# Patient Record
Sex: Male | Born: 1945 | Race: White | Hispanic: No | Smoking: Current every day smoker
Health system: Southern US, Community
[De-identification: ages and names within clinical notes are randomized; demographics above are authoritative.]

## PROBLEM LIST (undated history)

## (undated) DIAGNOSIS — E669 Obesity, unspecified: Secondary | ICD-10-CM

## (undated) DIAGNOSIS — N400 Enlarged prostate without lower urinary tract symptoms: Secondary | ICD-10-CM

## (undated) DIAGNOSIS — I4891 Unspecified atrial fibrillation: Secondary | ICD-10-CM

## (undated) DIAGNOSIS — G4730 Sleep apnea, unspecified: Secondary | ICD-10-CM

## (undated) DIAGNOSIS — I10 Essential (primary) hypertension: Secondary | ICD-10-CM

## (undated) DIAGNOSIS — E785 Hyperlipidemia, unspecified: Secondary | ICD-10-CM

## (undated) DIAGNOSIS — M109 Gout, unspecified: Secondary | ICD-10-CM

## (undated) DIAGNOSIS — I429 Cardiomyopathy, unspecified: Secondary | ICD-10-CM

## (undated) DIAGNOSIS — K219 Gastro-esophageal reflux disease without esophagitis: Secondary | ICD-10-CM

## (undated) HISTORY — PX: APPENDECTOMY: SHX54

## (undated) HISTORY — PX: ESOPHAGOGASTRODUODENOSCOPY: SHX1529

## (undated) HISTORY — PX: JOINT REPLACEMENT: SHX530

## (undated) HISTORY — PX: COLONOSCOPY: SHX174

---

## 2010-07-02 ENCOUNTER — Ambulatory Visit: Payer: Self-pay | Admitting: Internal Medicine

## 2010-08-26 ENCOUNTER — Ambulatory Visit: Payer: Self-pay | Admitting: Cardiology

## 2010-10-22 ENCOUNTER — Ambulatory Visit: Payer: Self-pay | Admitting: Cardiology

## 2012-02-09 ENCOUNTER — Emergency Department: Payer: Self-pay | Admitting: Emergency Medicine

## 2012-02-09 LAB — TROPONIN I: Troponin-I: <0.02 ng/mL

## 2012-02-09 LAB — BASIC METABOLIC PANEL
Anion Gap: 10 (ref 7–16)
Calcium, Total: 8.8 mg/dL (ref 8.5–10.1)
Chloride: 103 mmol/L (ref 98–107)
EGFR (African American): >60
EGFR (Non-African Amer.): >60
Glucose: 144 mg/dL — ABNORMAL HIGH (ref 65–99)
Osmolality: 282 (ref 275–301)
Potassium: 3.4 mmol/L — ABNORMAL LOW (ref 3.5–5.1)
Sodium: 141 mmol/L (ref 136–145)

## 2012-02-09 LAB — CBC
HCT: 49.4 % (ref 40.0–52.0)
MCHC: 34 g/dL (ref 32.0–36.0)
MCV: 101 fL — ABNORMAL HIGH (ref 80–100)
Platelet: 169 10*3/uL (ref 150–440)
RBC: 4.88 10*6/uL (ref 4.40–5.90)
RDW: 14.1 % (ref 11.5–14.5)
WBC: 9 10*3/uL (ref 3.8–10.6)

## 2012-02-09 LAB — CK TOTAL AND CKMB (NOT AT ARMC)
CK, Total: 56 U/L (ref 35–232)
CK-MB: 0.8 ng/mL (ref 0.5–3.6)

## 2013-01-22 ENCOUNTER — Emergency Department: Payer: Self-pay | Admitting: Emergency Medicine

## 2014-11-28 ENCOUNTER — Encounter: Payer: Self-pay | Admitting: *Deleted

## 2014-11-29 ENCOUNTER — Ambulatory Visit: Payer: PPO | Admitting: Anesthesiology

## 2014-11-29 ENCOUNTER — Encounter: Payer: Self-pay | Admitting: *Deleted

## 2014-11-29 ENCOUNTER — Ambulatory Visit
Admission: RE | Admit: 2014-11-29 | Discharge: 2014-11-29 | Disposition: A | Discharge status: Home Health | Payer: PPO | Source: Ambulatory Visit | Attending: Gastroenterology | Admitting: Gastroenterology

## 2014-11-29 ENCOUNTER — Encounter
Admission: RE | Disposition: A | Discharge status: Home Health | Payer: Self-pay | Source: Ambulatory Visit | Attending: Gastroenterology

## 2014-11-29 DIAGNOSIS — Z7901 Long term (current) use of anticoagulants: Secondary | ICD-10-CM | POA: Insufficient documentation

## 2014-11-29 DIAGNOSIS — K219 Gastro-esophageal reflux disease without esophagitis: Secondary | ICD-10-CM | POA: Diagnosis not present

## 2014-11-29 DIAGNOSIS — I10 Essential (primary) hypertension: Secondary | ICD-10-CM | POA: Insufficient documentation

## 2014-11-29 DIAGNOSIS — Z1211 Encounter for screening for malignant neoplasm of colon: Secondary | ICD-10-CM | POA: Insufficient documentation

## 2014-11-29 DIAGNOSIS — D122 Benign neoplasm of ascending colon: Secondary | ICD-10-CM | POA: Diagnosis not present

## 2014-11-29 DIAGNOSIS — Z86010 Personal history of colonic polyps: Secondary | ICD-10-CM | POA: Insufficient documentation

## 2014-11-29 DIAGNOSIS — K5730 Diverticulosis of large intestine without perforation or abscess without bleeding: Secondary | ICD-10-CM | POA: Insufficient documentation

## 2014-11-29 DIAGNOSIS — E669 Obesity, unspecified: Secondary | ICD-10-CM | POA: Insufficient documentation

## 2014-11-29 DIAGNOSIS — F17210 Nicotine dependence, cigarettes, uncomplicated: Secondary | ICD-10-CM | POA: Diagnosis not present

## 2014-11-29 DIAGNOSIS — G4730 Sleep apnea, unspecified: Secondary | ICD-10-CM | POA: Diagnosis not present

## 2014-11-29 DIAGNOSIS — I4891 Unspecified atrial fibrillation: Secondary | ICD-10-CM | POA: Insufficient documentation

## 2014-11-29 DIAGNOSIS — Z79899 Other long term (current) drug therapy: Secondary | ICD-10-CM | POA: Diagnosis not present

## 2014-11-29 HISTORY — DX: Hyperlipidemia, unspecified: E78.5

## 2014-11-29 HISTORY — DX: Sleep apnea, unspecified: G47.30

## 2014-11-29 HISTORY — DX: Gout, unspecified: M10.9

## 2014-11-29 HISTORY — DX: Cardiomyopathy, unspecified: I42.9

## 2014-11-29 HISTORY — DX: Benign prostatic hyperplasia without lower urinary tract symptoms: N40.0

## 2014-11-29 HISTORY — DX: Obesity, unspecified: E66.9

## 2014-11-29 HISTORY — DX: Gastro-esophageal reflux disease without esophagitis: K21.9

## 2014-11-29 HISTORY — DX: Unspecified atrial fibrillation: I48.91

## 2014-11-29 HISTORY — DX: Essential (primary) hypertension: I10

## 2014-11-29 HISTORY — PX: COLONOSCOPY WITH PROPOFOL: SHX5780

## 2014-11-29 SURGERY — COLONOSCOPY WITH PROPOFOL
Anesthesia: General

## 2014-11-29 MED ORDER — PROPOFOL INFUSION 10 MG/ML OPTIME
INTRAVENOUS | Status: DC | PRN
  Administered 2014-11-29: 120 ug/kg/min via INTRAVENOUS

## 2014-11-29 MED ORDER — FENTANYL CITRATE (PF) 100 MCG/2ML IJ SOLN
INTRAMUSCULAR | Status: DC | PRN
  Administered 2014-11-29: 50 ug via INTRAVENOUS

## 2014-11-29 MED ORDER — MIDAZOLAM HCL 2 MG/2ML IJ SOLN
INTRAMUSCULAR | Status: DC | PRN
  Administered 2014-11-29: 1 mg via INTRAVENOUS

## 2014-11-29 MED ORDER — SODIUM CHLORIDE 0.9 % IV SOLN
INTRAVENOUS | Status: DC
  Administered 2014-11-29: 09:00:00 via INTRAVENOUS

## 2014-11-29 NOTE — Anesthesia Preprocedure Evaluation (Signed)
Anesthesia Evaluation  Patient identified by MRN, date of birth, ID band Patient awake    Reviewed: Allergy & Precautions, H&P , NPO status , Patient's Chart, lab work & pertinent test results, reviewed documented beta blocker date and time   History of Anesthesia Complications Negative for: history of anesthetic complications  Airway Mallampati: III  TM Distance: >3 FB Neck ROM: full    Dental  (+) Caps, Missing, Poor Dentition   Pulmonary neg shortness of breath, sleep apnea and Continuous Positive Airway Pressure Ventilation , neg COPDneg recent URI, Current Smoker,  breath sounds clear to auscultation  Pulmonary exam normal       Cardiovascular Exercise Tolerance: Good hypertension, On Medications and On Home Beta Blockers - angina- CAD, - Past MI, - Cardiac Stents and - CABG Normal cardiovascular exam+ dysrhythmias Atrial Fibrillation Rhythm:regular Rate:Normal  Cardiomyopathy   Neuro/Psych negative neurological ROS  negative psych ROS   GI/Hepatic Neg liver ROS, GERD-  ,  Endo/Other  negative endocrine ROS  Renal/GU negative Renal ROS  negative genitourinary   Musculoskeletal   Abdominal   Peds  Hematology negative hematology ROS (+)   Anesthesia Other Findings Past Medical History:   Cardiomyopathy, secondary                                    A-fib                                                        Hypertension                                                 Sleep apnea                                                  Obesity                                                      Benign prostatic hyperplasia                                 GERD (gastroesophageal reflux disease)                       Gout                                                         Hyperlipidemia  Reproductive/Obstetrics negative OB ROS                              Anesthesia Physical Anesthesia Plan  ASA: III  Anesthesia Plan: General   Post-op Pain Management:    Induction:   Airway Management Planned:   Additional Equipment:   Intra-op Plan:   Post-operative Plan:   Informed Consent: I have reviewed the patients History and Physical, chart, labs and discussed the procedure including the risks, benefits and alternatives for the proposed anesthesia with the patient or authorized representative who has indicated his/her understanding and acceptance.   Dental Advisory Given  Plan Discussed with: Anesthesiologist, CRNA and Surgeon  Anesthesia Plan Comments:         Anesthesia Quick Evaluation

## 2014-11-29 NOTE — H&P (Signed)
  Date of Initial H&P: 11/19/2014 History reviewed, patient examined, no change in status, stable for surgery.

## 2014-11-29 NOTE — Transfer of Care (Signed)
Immediate Anesthesia Transfer of Care Note  Patient: Brendan Edwards  Procedure(s) Performed: Procedure(s): COLONOSCOPY WITH PROPOFOL (N/A)  Patient Location: PACU  Anesthesia Type:General  Level of Consciousness: sedated  Airway & Oxygen Therapy: Patient Spontanous Breathing and Patient connected to nasal cannula oxygen  Post-op Assessment: Report given to RN and Post -op Vital signs reviewed and stable  Post vital signs: Reviewed  Last Vitals:  Filed Vitals:   11/29/14 0824  BP: 125/93  Pulse: 88  Temp: 36.1 C  Resp: 18    Complications: No apparent anesthesia complications

## 2014-11-29 NOTE — Op Note (Signed)
[facility] Patient Name: Brendan Edwards Procedure Date: 11/29/2014 9:14 AM MRN: [id number] Account #: [id number] Date of Birth: 20-Jun-1945 Admit Type: Outpatient Age: 69 Room: [facility] ENDO ROOM 4 Gender: Male Note Status: Finalized Procedure:         Colonoscopy Indications:       Personal history of colonic polyps though pt insists that                     polyp was not truly a polyp; last took eliquis 2 days ago                     though he had to be off for minimum 3 days Providers:         Lupita Dawn. Candace Cruise, MD Referring MD:      Tracie Harrier, MD (Referring MD) Medicines:         Monitored Anesthesia Care Complications:     No immediate complications. Procedure:         Pre-Anesthesia Assessment:                    - Prior to the procedure, a History and Physical was                     performed, and patient medications, allergies and                     sensitivities were reviewed. The patient's tolerance of                     previous anesthesia was reviewed.                    - The risks and benefits of the procedure and the sedation                     options and risks were discussed with the patient. All                     questions were answered and informed consent was obtained.                    - After reviewing the risks and benefits, the patient was                     deemed in satisfactory condition to undergo the procedure.                    After obtaining informed consent, the colonoscope was                     passed under direct vision. Throughout the procedure, the                     patient's blood pressure, pulse, and oxygen saturations                     were monitored continuously. The Colonoscope was                     introduced through the anus and advanced to the the cecum,                     identified by appendiceal orifice and ileocecal valve. The  colonoscopy was performed without  difficulty. The patient                     tolerated the procedure well. The quality of the bowel                     preparation was good. Findings:      A single small-mouthed diverticulum was found in the sigmoid colon.      A small polyp was found in the ascending colon. The polyp was sessile.       Not removed due to eliquis use.      The exam was otherwise without abnormality. Impression:        - Diverticulosis in the sigmoid colon.                    - One small polyp in the ascending colon.                    - The examination was otherwise normal.                    - No specimens collected. Recommendation:    - Discharge patient to home.                    - Repeat colonoscopy in 1 year for surveillance.                    - The findings and recommendations were discussed with the                     surgeon.                    - Needs to be off eliquis for min 3 days next time to                     remove polyp. Procedure Code(s): --- Professional ---                    [phone], Colonoscopy, flexible; diagnostic, including                     collection of specimen(s) by brushing or washing, when                     performed (separate procedure) Diagnosis Code(s): --- Professional ---                    D12.2, Benign neoplasm of ascending colon                    Z86.010, Personal history of colonic polyps                    K57.30, Diverticulosis of large intestine without                     perforation or abscess without bleeding CPT copyright 2014 [employer]. All rights reserved. The codes documented in this report are preliminary and upon coder review may  be revised to meet current compliance requirements. Hulen Luster, MD 11/29/2014 9:31:56 AM This report has been signed electronically. Number of Addenda: 0 Note Initiated On: 11/29/2014 9:14 AM Scope Withdrawal Time: 0 hours 6 minutes 2 seconds  Total Procedure Duration: 0 hours 10 minutes  4  seconds       [facility]

## 2014-11-29 NOTE — Anesthesia Procedure Notes (Signed)
Performed by: Vaughan Sine Pre-anesthesia Checklist: Patient identified, Emergency Drugs available, Suction available, Patient being monitored and Timeout performed Patient Re-evaluated:Patient Re-evaluated prior to inductionOxygen Delivery Method: Nasal cannula Intubation Type: IV induction Airway Equipment and Method: Oral airway Placement Confirmation: CO2 detector and positive ETCO2

## 2014-11-30 ENCOUNTER — Encounter: Payer: Self-pay | Admitting: Gastroenterology

## 2014-11-30 NOTE — Anesthesia Postprocedure Evaluation (Signed)
  Anesthesia Post-op Note  Patient: Brendan Edwards  Procedure(s) Performed: Procedure(s): COLONOSCOPY WITH PROPOFOL (N/A)  Anesthesia type:General  Patient location: PACU  Post pain: Pain level controlled  Post assessment: Post-op Vital signs reviewed, Patient's Cardiovascular Status Stable, Respiratory Function Stable, Patent Airway and No signs of Nausea or vomiting  Post vital signs: Reviewed and stable  Last Vitals:  Filed Vitals:   11/29/14 1010  BP: 152/98  Pulse: 122  Temp:   Resp: 22    Level of consciousness: awake, alert  and patient cooperative  Complications: No apparent anesthesia complications

## 2014-12-06 NOTE — Progress Notes (Signed)
Patient ID: Brendan Edwards, male   DOB: [birth year], 69 y.o.   MRN: [id number]   Primary Care Physician:  Tracie Harrier, MD Primary Gastroenterologist:  Dr. Candace Cruise  Pre-Procedure History & Physical: HPI:  Brendan Edwards is a 69 y.o. male is here for an colonoscopy.  Past Medical History  Diagnosis Date  . Cardiomyopathy, secondary   . A-fib   . Hypertension   . Sleep apnea   . Obesity   . Benign prostatic hyperplasia   . GERD (gastroesophageal reflux disease)   . Gout   . Hyperlipidemia     Past Surgical History  Procedure Laterality Date  . Appendectomy    . Joint replacement      right and left hip  . Colonoscopy    . Esophagogastroduodenoscopy    . Colonoscopy with propofol N/A 11/29/2014    Procedure: COLONOSCOPY WITH PROPOFOL;  Surgeon: Hulen Luster, MD;  Location: [facility] ENDOSCOPY;  Service: Gastroenterology;  Laterality: N/A;    Prior to Admission medications   Medication Sig Start Date End Date Taking? Authorizing Provider  allopurinol (ZYLOPRIM) 300 MG tablet Take 300 mg by mouth daily.   Yes Historical Provider, MD  apixaban (ELIQUIS) 5 MG TABS tablet Take 5 mg by mouth 2 (two) times daily.   Yes Historical Provider, MD  diltiazem (TIAZAC) 300 MG 24 hr capsule Take 300 mg by mouth daily.   Yes Historical Provider, MD  furosemide (LASIX) 40 MG tablet Take 40 mg by mouth.   Yes Historical Provider, MD  ibuprofen (ADVIL,MOTRIN) 100 MG/5ML suspension Take 200 mg by mouth every 4 (four) hours as needed.   Yes Historical Provider, MD  lisinopril (PRINIVIL,ZESTRIL) 5 MG tablet Take 5 mg by mouth daily.   Yes Historical Provider, MD  metoprolol succinate (TOPROL-XL) 100 MG 24 hr tablet Take 100 mg by mouth daily. Take with or immediately following a meal.   Yes Historical Provider, MD  Multiple Vitamin (MULTIVITAMIN) tablet Take by mouth daily.   Yes Historical Provider, MD  OMEGA-3 FATTY ACIDS PO Take by mouth.   Yes Historical Provider, MD  potassium chloride SA (K-DUR,KLOR-CON)  20 MEQ tablet Take 20 mEq by mouth 2 (two) times daily.   Yes Historical Provider, MD  rOPINIRole (REQUIP) 0.5 MG tablet Take 0.5 mg by mouth 3 (three) times daily.   Yes Historical Provider, MD  temazepam (RESTORIL) 30 MG capsule Take 30 mg by mouth at bedtime as needed for sleep.   Yes Historical Provider, MD    Allergies as of 11/09/2014  . (Not on File)    History reviewed. No pertinent family history.  Social History   Social History  . Marital Status: Married    Spouse Name: N/A  . Number of Children: N/A  . Years of Education: N/A   Occupational History  . Not on file.   Social History Main Topics  . Smoking status: Current Every Day Smoker  . Smokeless tobacco: Not on file  . Alcohol Use: Yes  . Drug Use: No  . Sexual Activity: Not on file   Other Topics Concern  . Not on file   Social History Narrative    Review of Systems: See HPI, otherwise negative ROS  Physical Exam: BP 152/98 mmHg  Pulse 122  Temp(Src) 97.9 F (36.6 C) (Tympanic)  Resp 22  Ht 5\' 8"  (1.727 m)  Wt 95.255 kg (210 lb)  BMI 31.94 kg/m2  SpO2 96% General:   Alert,  pleasant and cooperative in NAD  Head:  Normocephalic and atraumatic. Neck:  Supple; no masses or thyromegaly. Lungs:  Clear throughout to auscultation.    Heart:  Regular rate and rhythm. Abdomen:  Soft, nontender and nondistended. Normal bowel sounds, without guarding, and without rebound.   Neurologic:  Alert and  oriented x4;  grossly normal neurologically.  Impression/Plan: Brendan Edwards is here for an colonoscopy to be performed for personal hx of colon polyps. Risks, benefits, limitations, and alternatives regarding colonoscopy have been reviewed with the patient.  Questions have been answered.  All parties agreeable.   Brendan Edwards, Brendan Dawn, MD  12/06/2014, 10:13 AM

## 2014-12-06 NOTE — Progress Notes (Signed)
Patient ID: Brendan Edwards, male   DOB: 02-May-1945, 70 y.o.   MRN: [id number]   Primary Care Physician:  Tracie Harrier, MD Primary Gastroenterologist:  Dr. Candace Cruise  Pre-Procedure History & Physical: HPI:  Brendan Edwards is a 69 y.o. male is here for an colonoscopy.   Past Medical History  Diagnosis Date  . Cardiomyopathy, secondary   . A-fib   . Hypertension   . Sleep apnea   . Obesity   . Benign prostatic hyperplasia   . GERD (gastroesophageal reflux disease)   . Gout   . Hyperlipidemia     Past Surgical History  Procedure Laterality Date  . Appendectomy    . Joint replacement      right and left hip  . Colonoscopy    . Esophagogastroduodenoscopy    . Colonoscopy with propofol N/A 11/29/2014    Procedure: COLONOSCOPY WITH PROPOFOL;  Surgeon: Hulen Luster, MD;  Location: [facility] ENDOSCOPY;  Service: Gastroenterology;  Laterality: N/A;    Prior to Admission medications   Medication Sig Start Date End Date Taking? Authorizing Provider  allopurinol (ZYLOPRIM) 300 MG tablet Take 300 mg by mouth daily.   Yes Historical Provider, MD  apixaban (ELIQUIS) 5 MG TABS tablet Take 5 mg by mouth 2 (two) times daily.   Yes Historical Provider, MD  diltiazem (TIAZAC) 300 MG 24 hr capsule Take 300 mg by mouth daily.   Yes Historical Provider, MD  furosemide (LASIX) 40 MG tablet Take 40 mg by mouth.   Yes Historical Provider, MD  ibuprofen (ADVIL,MOTRIN) 100 MG/5ML suspension Take 200 mg by mouth every 4 (four) hours as needed.   Yes Historical Provider, MD  lisinopril (PRINIVIL,ZESTRIL) 5 MG tablet Take 5 mg by mouth daily.   Yes Historical Provider, MD  metoprolol succinate (TOPROL-XL) 100 MG 24 hr tablet Take 100 mg by mouth daily. Take with or immediately following a meal.   Yes Historical Provider, MD  Multiple Vitamin (MULTIVITAMIN) tablet Take by mouth daily.   Yes Historical Provider, MD  OMEGA-3 FATTY ACIDS PO Take by mouth.   Yes Historical Provider, MD  potassium chloride SA  (K-DUR,KLOR-CON) 20 MEQ tablet Take 20 mEq by mouth 2 (two) times daily.   Yes Historical Provider, MD  rOPINIRole (REQUIP) 0.5 MG tablet Take 0.5 mg by mouth 3 (three) times daily.   Yes Historical Provider, MD  temazepam (RESTORIL) 30 MG capsule Take 30 mg by mouth at bedtime as needed for sleep.   Yes Historical Provider, MD    Allergies as of 11/09/2014  . (Not on File)    History reviewed. No pertinent family history.  Social History   Social History  . Marital Status: Married    Spouse Name: N/A  . Number of Children: N/A  . Years of Education: N/A   Occupational History  . Not on file.   Social History Main Topics  . Smoking status: Current Every Day Smoker  . Smokeless tobacco: Not on file  . Alcohol Use: Yes  . Drug Use: No  . Sexual Activity: Not on file   Other Topics Concern  . Not on file   Social History Narrative    Review of Systems: See HPI, otherwise negative ROS  Physical Exam: BP 152/98 mmHg  Pulse 122  Temp(Src) 97.9 F (36.6 C) (Tympanic)  Resp 22  Ht 5\' 8"  (1.727 m)  Wt 95.255 kg (210 lb)  BMI 31.94 kg/m2  SpO2 96% General:   Alert,  pleasant and cooperative in  NAD Head:  Normocephalic and atraumatic. Neck:  Supple; no masses or thyromegaly. Lungs:  Clear throughout to auscultation.    Heart:  Regular rate and rhythm. Abdomen:  Soft, nontender and nondistended. Normal bowel sounds, without guarding, and without rebound.   Neurologic:  Alert and  oriented x4;  grossly normal neurologically.  Impression/Plan: [name] is here for an colonoscopy to be performed for screening.  Risks, benefits, limitations, and alternatives regarding colonoscopy have been reviewed with the patient.  Questions have been answered.  All parties agreeable.   Keyshla Tunison, Lupita Dawn, MD  12/06/2014, 10:16 AM

## 2015-04-24 DIAGNOSIS — J069 Acute upper respiratory infection, unspecified: Secondary | ICD-10-CM | POA: Diagnosis not present

## 2015-07-11 DIAGNOSIS — I480 Paroxysmal atrial fibrillation: Secondary | ICD-10-CM | POA: Diagnosis not present

## 2015-07-11 DIAGNOSIS — I10 Essential (primary) hypertension: Secondary | ICD-10-CM | POA: Diagnosis not present

## 2015-07-11 DIAGNOSIS — I5022 Chronic systolic (congestive) heart failure: Secondary | ICD-10-CM | POA: Diagnosis not present

## 2015-07-11 DIAGNOSIS — G4733 Obstructive sleep apnea (adult) (pediatric): Secondary | ICD-10-CM | POA: Diagnosis not present

## 2015-11-18 DIAGNOSIS — M546 Pain in thoracic spine: Secondary | ICD-10-CM | POA: Diagnosis not present

## 2015-11-18 DIAGNOSIS — M25551 Pain in right hip: Secondary | ICD-10-CM | POA: Diagnosis not present

## 2015-12-13 DIAGNOSIS — E119 Type 2 diabetes mellitus without complications: Secondary | ICD-10-CM | POA: Diagnosis not present

## 2015-12-13 DIAGNOSIS — I480 Paroxysmal atrial fibrillation: Secondary | ICD-10-CM | POA: Diagnosis not present

## 2015-12-13 DIAGNOSIS — G4733 Obstructive sleep apnea (adult) (pediatric): Secondary | ICD-10-CM | POA: Diagnosis not present

## 2015-12-13 DIAGNOSIS — G2581 Restless legs syndrome: Secondary | ICD-10-CM | POA: Diagnosis not present

## 2015-12-13 DIAGNOSIS — I10 Essential (primary) hypertension: Secondary | ICD-10-CM | POA: Diagnosis not present

## 2015-12-13 DIAGNOSIS — I420 Dilated cardiomyopathy: Secondary | ICD-10-CM | POA: Diagnosis not present

## 2015-12-13 DIAGNOSIS — Z23 Encounter for immunization: Secondary | ICD-10-CM | POA: Diagnosis not present

## 2015-12-13 DIAGNOSIS — I5022 Chronic systolic (congestive) heart failure: Secondary | ICD-10-CM | POA: Diagnosis not present

## 2016-07-03 DIAGNOSIS — G2581 Restless legs syndrome: Secondary | ICD-10-CM | POA: Diagnosis not present

## 2016-07-03 DIAGNOSIS — I5022 Chronic systolic (congestive) heart failure: Secondary | ICD-10-CM | POA: Diagnosis not present

## 2016-07-03 DIAGNOSIS — I480 Paroxysmal atrial fibrillation: Secondary | ICD-10-CM | POA: Diagnosis not present

## 2016-07-03 DIAGNOSIS — Z0000 Encounter for general adult medical examination without abnormal findings: Secondary | ICD-10-CM | POA: Diagnosis not present

## 2016-07-03 DIAGNOSIS — E119 Type 2 diabetes mellitus without complications: Secondary | ICD-10-CM | POA: Diagnosis not present

## 2016-07-03 DIAGNOSIS — G4733 Obstructive sleep apnea (adult) (pediatric): Secondary | ICD-10-CM | POA: Diagnosis not present

## 2016-07-03 DIAGNOSIS — R0789 Other chest pain: Secondary | ICD-10-CM | POA: Diagnosis not present

## 2016-07-06 DIAGNOSIS — G4733 Obstructive sleep apnea (adult) (pediatric): Secondary | ICD-10-CM | POA: Diagnosis not present

## 2016-07-06 DIAGNOSIS — I420 Dilated cardiomyopathy: Secondary | ICD-10-CM | POA: Diagnosis not present

## 2016-07-06 DIAGNOSIS — G2581 Restless legs syndrome: Secondary | ICD-10-CM | POA: Diagnosis not present

## 2016-07-06 DIAGNOSIS — I5022 Chronic systolic (congestive) heart failure: Secondary | ICD-10-CM | POA: Diagnosis not present

## 2016-07-06 DIAGNOSIS — I10 Essential (primary) hypertension: Secondary | ICD-10-CM | POA: Diagnosis not present

## 2016-07-06 DIAGNOSIS — E119 Type 2 diabetes mellitus without complications: Secondary | ICD-10-CM | POA: Diagnosis not present

## 2016-07-06 DIAGNOSIS — Z125 Encounter for screening for malignant neoplasm of prostate: Secondary | ICD-10-CM | POA: Diagnosis not present

## 2016-07-06 DIAGNOSIS — I480 Paroxysmal atrial fibrillation: Secondary | ICD-10-CM | POA: Diagnosis not present

## 2016-07-06 DIAGNOSIS — E784 Other hyperlipidemia: Secondary | ICD-10-CM | POA: Diagnosis not present

## 2016-07-06 DIAGNOSIS — Z23 Encounter for immunization: Secondary | ICD-10-CM | POA: Diagnosis not present

## 2016-08-10 ENCOUNTER — Emergency Department: Payer: PPO

## 2016-08-10 ENCOUNTER — Inpatient Hospital Stay
Admission: EM | Admit: 2016-08-10 | Discharge: 2016-08-20 | DRG: 309 | Disposition: A | Discharge status: Home Health | Payer: PPO | Attending: Internal Medicine | Admitting: Internal Medicine

## 2016-08-10 ENCOUNTER — Encounter: Payer: Self-pay | Admitting: Emergency Medicine

## 2016-08-10 DIAGNOSIS — S01511A Laceration without foreign body of lip, initial encounter: Secondary | ICD-10-CM | POA: Diagnosis not present

## 2016-08-10 DIAGNOSIS — I5022 Chronic systolic (congestive) heart failure: Secondary | ICD-10-CM | POA: Diagnosis present

## 2016-08-10 DIAGNOSIS — K219 Gastro-esophageal reflux disease without esophagitis: Secondary | ICD-10-CM | POA: Diagnosis not present

## 2016-08-10 DIAGNOSIS — E118 Type 2 diabetes mellitus with unspecified complications: Secondary | ICD-10-CM | POA: Diagnosis not present

## 2016-08-10 DIAGNOSIS — W19XXXA Unspecified fall, initial encounter: Secondary | ICD-10-CM

## 2016-08-10 DIAGNOSIS — D72829 Elevated white blood cell count, unspecified: Secondary | ICD-10-CM | POA: Diagnosis not present

## 2016-08-10 DIAGNOSIS — Z6832 Body mass index (BMI) 32.0-32.9, adult: Secondary | ICD-10-CM | POA: Diagnosis not present

## 2016-08-10 DIAGNOSIS — I480 Paroxysmal atrial fibrillation: Secondary | ICD-10-CM | POA: Diagnosis not present

## 2016-08-10 DIAGNOSIS — I4891 Unspecified atrial fibrillation: Secondary | ICD-10-CM | POA: Diagnosis not present

## 2016-08-10 DIAGNOSIS — R531 Weakness: Secondary | ICD-10-CM | POA: Diagnosis not present

## 2016-08-10 DIAGNOSIS — N400 Enlarged prostate without lower urinary tract symptoms: Secondary | ICD-10-CM | POA: Diagnosis not present

## 2016-08-10 DIAGNOSIS — F10239 Alcohol dependence with withdrawal, unspecified: Secondary | ICD-10-CM

## 2016-08-10 DIAGNOSIS — R58 Hemorrhage, not elsewhere classified: Secondary | ICD-10-CM

## 2016-08-10 DIAGNOSIS — F17290 Nicotine dependence, other tobacco product, uncomplicated: Secondary | ICD-10-CM | POA: Diagnosis present

## 2016-08-10 DIAGNOSIS — K7010 Alcoholic hepatitis without ascites: Secondary | ICD-10-CM | POA: Diagnosis present

## 2016-08-10 DIAGNOSIS — I110 Hypertensive heart disease with heart failure: Secondary | ICD-10-CM | POA: Diagnosis present

## 2016-08-10 DIAGNOSIS — F10939 Alcohol use, unspecified with withdrawal, unspecified: Secondary | ICD-10-CM

## 2016-08-10 DIAGNOSIS — Z7901 Long term (current) use of anticoagulants: Secondary | ICD-10-CM

## 2016-08-10 DIAGNOSIS — W010XXA Fall on same level from slipping, tripping and stumbling without subsequent striking against object, initial encounter: Secondary | ICD-10-CM | POA: Diagnosis present

## 2016-08-10 DIAGNOSIS — S2231XA Fracture of one rib, right side, initial encounter for closed fracture: Secondary | ICD-10-CM | POA: Diagnosis not present

## 2016-08-10 DIAGNOSIS — G4733 Obstructive sleep apnea (adult) (pediatric): Secondary | ICD-10-CM | POA: Diagnosis not present

## 2016-08-10 DIAGNOSIS — R000 Tachycardia, unspecified: Secondary | ICD-10-CM | POA: Diagnosis not present

## 2016-08-10 DIAGNOSIS — Y92002 Bathroom of unspecified non-institutional (private) residence single-family (private) house as the place of occurrence of the external cause: Secondary | ICD-10-CM | POA: Diagnosis not present

## 2016-08-10 DIAGNOSIS — M4854XA Collapsed vertebra, not elsewhere classified, thoracic region, initial encounter for fracture: Secondary | ICD-10-CM | POA: Diagnosis not present

## 2016-08-10 DIAGNOSIS — S301XXA Contusion of abdominal wall, initial encounter: Secondary | ICD-10-CM | POA: Diagnosis not present

## 2016-08-10 DIAGNOSIS — R0789 Other chest pain: Secondary | ICD-10-CM

## 2016-08-10 DIAGNOSIS — E785 Hyperlipidemia, unspecified: Secondary | ICD-10-CM | POA: Diagnosis not present

## 2016-08-10 DIAGNOSIS — J9811 Atelectasis: Secondary | ICD-10-CM | POA: Diagnosis not present

## 2016-08-10 DIAGNOSIS — Z716 Tobacco abuse counseling: Secondary | ICD-10-CM | POA: Diagnosis not present

## 2016-08-10 DIAGNOSIS — E669 Obesity, unspecified: Secondary | ICD-10-CM | POA: Diagnosis not present

## 2016-08-10 DIAGNOSIS — S3993XA Unspecified injury of pelvis, initial encounter: Secondary | ICD-10-CM | POA: Diagnosis not present

## 2016-08-10 DIAGNOSIS — I499 Cardiac arrhythmia, unspecified: Secondary | ICD-10-CM | POA: Diagnosis not present

## 2016-08-10 DIAGNOSIS — R1010 Upper abdominal pain, unspecified: Secondary | ICD-10-CM | POA: Diagnosis not present

## 2016-08-10 DIAGNOSIS — E119 Type 2 diabetes mellitus without complications: Secondary | ICD-10-CM

## 2016-08-10 DIAGNOSIS — Z79899 Other long term (current) drug therapy: Secondary | ICD-10-CM

## 2016-08-10 DIAGNOSIS — F10231 Alcohol dependence with withdrawal delirium: Secondary | ICD-10-CM | POA: Diagnosis not present

## 2016-08-10 DIAGNOSIS — Z23 Encounter for immunization: Secondary | ICD-10-CM | POA: Diagnosis not present

## 2016-08-10 DIAGNOSIS — S0990XA Unspecified injury of head, initial encounter: Secondary | ICD-10-CM | POA: Diagnosis not present

## 2016-08-10 DIAGNOSIS — I482 Chronic atrial fibrillation: Principal | ICD-10-CM | POA: Diagnosis present

## 2016-08-10 DIAGNOSIS — S0993XA Unspecified injury of face, initial encounter: Secondary | ICD-10-CM | POA: Diagnosis not present

## 2016-08-10 DIAGNOSIS — G2581 Restless legs syndrome: Secondary | ICD-10-CM | POA: Diagnosis present

## 2016-08-10 DIAGNOSIS — S2241XA Multiple fractures of ribs, right side, initial encounter for closed fracture: Secondary | ICD-10-CM | POA: Diagnosis present

## 2016-08-10 DIAGNOSIS — S3991XA Unspecified injury of abdomen, initial encounter: Secondary | ICD-10-CM | POA: Diagnosis not present

## 2016-08-10 LAB — CBC WITH DIFFERENTIAL/PLATELET
BASOS ABS: 0.1 10*3/uL (ref 0–0.1)
BASOS PCT: 1 %
EOS ABS: 0 10*3/uL (ref 0–0.7)
EOS PCT: 0 %
HCT: 46.8 % (ref 40.0–52.0)
HEMOGLOBIN: 15.7 g/dL (ref 13.0–18.0)
Lymphocytes Relative: 4 %
Lymphs Abs: 0.7 10*3/uL — ABNORMAL LOW (ref 1.0–3.6)
MCH: 34.8 pg — ABNORMAL HIGH (ref 26.0–34.0)
MCHC: 33.6 g/dL (ref 32.0–36.0)
MCV: 103.5 fL — ABNORMAL HIGH (ref 80.0–100.0)
Monocytes Absolute: 1.2 10*3/uL — ABNORMAL HIGH (ref 0.2–1.0)
Monocytes Relative: 7 %
NEUTROS PCT: 88 %
Neutro Abs: 14.8 10*3/uL — ABNORMAL HIGH (ref 1.4–6.5)
PLATELETS: 195 10*3/uL (ref 150–440)
RBC: 4.52 MIL/uL (ref 4.40–5.90)
RDW: 14.6 % — ABNORMAL HIGH (ref 11.5–14.5)
WBC: 16.9 10*3/uL — AB (ref 3.8–10.6)

## 2016-08-10 LAB — COMPREHENSIVE METABOLIC PANEL
ALBUMIN: 3.9 g/dL (ref 3.5–5.0)
ALT: 41 U/L (ref 17–63)
AST: 50 U/L — AB (ref 15–41)
Alkaline Phosphatase: 91 U/L (ref 38–126)
Anion gap: 16 — ABNORMAL HIGH (ref 5–15)
BUN: 11 mg/dL (ref 6–20)
CHLORIDE: 101 mmol/L (ref 101–111)
CO2: 22 mmol/L (ref 22–32)
CREATININE: 0.7 mg/dL (ref 0.61–1.24)
Calcium: 8.8 mg/dL — ABNORMAL LOW (ref 8.9–10.3)
GFR calc non Af Amer: >60 mL/min (ref >60)
GLUCOSE: 160 mg/dL — AB (ref 65–99)
Potassium: 4.1 mmol/L (ref 3.5–5.1)
SODIUM: 139 mmol/L (ref 135–145)
Total Bilirubin: 1 mg/dL (ref 0.3–1.2)
Total Protein: 7.6 g/dL (ref 6.5–8.1)

## 2016-08-10 LAB — AMMONIA: AMMONIA: 31 umol/L (ref 9–35)

## 2016-08-10 LAB — MRSA PCR SCREENING: MRSA BY PCR: NEGATIVE

## 2016-08-10 LAB — TROPONIN I
Troponin I: <0.03 ng/mL (ref <0.03)
Troponin I: <0.03 ng/mL (ref <0.03)

## 2016-08-10 LAB — LIPASE, BLOOD: LIPASE: 19 U/L (ref 11–51)

## 2016-08-10 LAB — GLUCOSE, CAPILLARY
Glucose-Capillary: 150 mg/dL — ABNORMAL HIGH (ref 65–99)
Glucose-Capillary: 163 mg/dL — ABNORMAL HIGH (ref 65–99)

## 2016-08-10 LAB — TSH: TSH: 2.871 u[IU]/mL (ref 0.350–4.500)

## 2016-08-10 LAB — CK: Total CK: 297 U/L (ref 49–397)

## 2016-08-10 LAB — MAGNESIUM: Magnesium: 1.1 mg/dL — ABNORMAL LOW (ref 1.7–2.4)

## 2016-08-10 MED ORDER — MORPHINE SULFATE (PF) 4 MG/ML IV SOLN
4.0000 mg | Freq: Once | INTRAVENOUS | Status: AC
  Administered 2016-08-10: 4 mg via INTRAVENOUS
  Filled 2016-08-10: qty 1

## 2016-08-10 MED ORDER — DILTIAZEM HCL 100 MG IV SOLR: 15.0000 mg | Freq: Once | INTRAVENOUS | Status: DC

## 2016-08-10 MED ORDER — ACETAMINOPHEN 650 MG RE SUPP: 650.0000 mg | Freq: Four times a day (QID) | RECTAL | Status: DC | PRN

## 2016-08-10 MED ORDER — LISINOPRIL 5 MG PO TABS
5.0000 mg | ORAL_TABLET | Freq: Every day | ORAL | Status: DC
  Administered 2016-08-10 – 2016-08-20 (×11): 5 mg via ORAL
  Filled 2016-08-10 (×11): qty 1

## 2016-08-10 MED ORDER — DILTIAZEM LOAD VIA INFUSION
20.0000 mg | Freq: Once | INTRAVENOUS | Status: AC
  Administered 2016-08-10: 20 mg via INTRAVENOUS
  Filled 2016-08-10: qty 20

## 2016-08-10 MED ORDER — DILTIAZEM LOAD VIA INFUSION
20.0000 mg | Freq: Once | INTRAVENOUS | Status: DC
  Filled 2016-08-10: qty 20

## 2016-08-10 MED ORDER — METOPROLOL TARTRATE 5 MG/5ML IV SOLN
5.0000 mg | Freq: Four times a day (QID) | INTRAVENOUS | Status: DC | PRN
  Administered 2016-08-10 – 2016-08-11 (×2): 5 mg via INTRAVENOUS
  Filled 2016-08-10 (×4): qty 5

## 2016-08-10 MED ORDER — INSULIN ASPART 100 UNIT/ML ~~LOC~~ SOLN: 0.0000 [IU] | Freq: Every day | SUBCUTANEOUS | Status: DC

## 2016-08-10 MED ORDER — ONDANSETRON HCL 4 MG/2ML IJ SOLN
4.0000 mg | Freq: Once | INTRAMUSCULAR | Status: AC
  Administered 2016-08-10: 4 mg via INTRAVENOUS
  Filled 2016-08-10: qty 2

## 2016-08-10 MED ORDER — SODIUM CHLORIDE 0.9 % IV BOLUS (SEPSIS)
1000.0000 mL | Freq: Once | INTRAVENOUS | Status: AC
  Administered 2016-08-10: 1000 mL via INTRAVENOUS

## 2016-08-10 MED ORDER — MORPHINE SULFATE (PF) 2 MG/ML IV SOLN
1.0000 mg | INTRAVENOUS | Status: DC | PRN
  Administered 2016-08-11: 2 mg via INTRAVENOUS
  Administered 2016-08-11: 1 mg via INTRAVENOUS
  Filled 2016-08-10 (×2): qty 1

## 2016-08-10 MED ORDER — SODIUM CHLORIDE 0.9% FLUSH
3.0000 mL | Freq: Two times a day (BID) | INTRAVENOUS | Status: DC
  Administered 2016-08-10 – 2016-08-17 (×12): 3 mL via INTRAVENOUS

## 2016-08-10 MED ORDER — ACETAMINOPHEN 325 MG PO TABS: 650.0000 mg | ORAL_TABLET | Freq: Four times a day (QID) | ORAL | Status: DC | PRN

## 2016-08-10 MED ORDER — SODIUM CHLORIDE 0.9% FLUSH: 3.0000 mL | INTRAVENOUS | Status: DC | PRN

## 2016-08-10 MED ORDER — ONDANSETRON HCL 4 MG PO TABS: 4.0000 mg | ORAL_TABLET | Freq: Four times a day (QID) | ORAL | Status: DC | PRN

## 2016-08-10 MED ORDER — METOPROLOL TARTRATE 5 MG/5ML IV SOLN
INTRAVENOUS | Status: AC
  Administered 2016-08-10: 5 mg via INTRAVENOUS
  Filled 2016-08-10: qty 5

## 2016-08-10 MED ORDER — ADULT MULTIVITAMIN W/MINERALS CH
1.0000 | ORAL_TABLET | Freq: Every day | ORAL | Status: DC
Start: 2016-08-10 — End: 2016-08-20
  Administered 2016-08-10 – 2016-08-20 (×11): 1 via ORAL
  Filled 2016-08-10 (×11): qty 1

## 2016-08-10 MED ORDER — LORAZEPAM 2 MG/ML IJ SOLN
INTRAMUSCULAR | Status: AC
  Administered 2016-08-10: 1 mg via INTRAVENOUS
  Filled 2016-08-10: qty 1

## 2016-08-10 MED ORDER — ONDANSETRON HCL 4 MG/2ML IJ SOLN: 4.0000 mg | Freq: Four times a day (QID) | INTRAMUSCULAR | Status: DC | PRN

## 2016-08-10 MED ORDER — APIXABAN 5 MG PO TABS
5.0000 mg | ORAL_TABLET | Freq: Two times a day (BID) | ORAL | Status: DC
  Administered 2016-08-10 – 2016-08-20 (×20): 5 mg via ORAL
  Filled 2016-08-10 (×20): qty 1

## 2016-08-10 MED ORDER — INSULIN ASPART 100 UNIT/ML ~~LOC~~ SOLN
4.0000 [IU] | Freq: Three times a day (TID) | SUBCUTANEOUS | Status: DC
  Administered 2016-08-11 – 2016-08-17 (×10): 4 [IU] via SUBCUTANEOUS
  Filled 2016-08-10 (×11): qty 4

## 2016-08-10 MED ORDER — ASPIRIN EC 81 MG PO TBEC
81.0000 mg | DELAYED_RELEASE_TABLET | Freq: Every day | ORAL | Status: DC
  Administered 2016-08-10 – 2016-08-20 (×11): 81 mg via ORAL
  Filled 2016-08-10 (×11): qty 1

## 2016-08-10 MED ORDER — METOPROLOL TARTRATE 5 MG/5ML IV SOLN
5.0000 mg | Freq: Once | INTRAVENOUS | Status: AC
  Administered 2016-08-10: 5 mg via INTRAVENOUS

## 2016-08-10 MED ORDER — POTASSIUM CHLORIDE CRYS ER 20 MEQ PO TBCR
20.0000 meq | EXTENDED_RELEASE_TABLET | Freq: Two times a day (BID) | ORAL | Status: DC
  Administered 2016-08-10 – 2016-08-15 (×9): 20 meq via ORAL
  Filled 2016-08-10 (×9): qty 1

## 2016-08-10 MED ORDER — ROPINIROLE HCL 0.25 MG PO TABS
0.5000 mg | ORAL_TABLET | Freq: Three times a day (TID) | ORAL | Status: DC
  Administered 2016-08-10 – 2016-08-20 (×19): 0.5 mg via ORAL
  Filled 2016-08-10 (×8): qty 2
  Filled 2016-08-10: qty 1
  Filled 2016-08-10 (×16): qty 2

## 2016-08-10 MED ORDER — TETANUS-DIPHTH-ACELL PERTUSSIS 5-2.5-18.5 LF-MCG/0.5 IM SUSP
0.5000 mL | Freq: Once | INTRAMUSCULAR | Status: AC
  Administered 2016-08-10: 0.5 mL via INTRAMUSCULAR
  Filled 2016-08-10: qty 0.5

## 2016-08-10 MED ORDER — HYDRALAZINE HCL 20 MG/ML IJ SOLN
10.0000 mg | Freq: Four times a day (QID) | INTRAMUSCULAR | Status: DC | PRN
  Administered 2016-08-10: 10 mg via INTRAVENOUS
  Filled 2016-08-10: qty 1

## 2016-08-10 MED ORDER — TRAMADOL HCL 50 MG PO TABS
50.0000 mg | ORAL_TABLET | Freq: Four times a day (QID) | ORAL | Status: DC | PRN
  Administered 2016-08-10 – 2016-08-11 (×2): 50 mg via ORAL
  Filled 2016-08-10 (×3): qty 1

## 2016-08-10 MED ORDER — LORAZEPAM 1 MG PO TABS: 1.0000 mg | ORAL_TABLET | Freq: Four times a day (QID) | ORAL | Status: DC | PRN

## 2016-08-10 MED ORDER — VITAMIN B-1 100 MG PO TABS
250.0000 mg | ORAL_TABLET | Freq: Every day | ORAL | Status: DC
  Administered 2016-08-10 – 2016-08-20 (×11): 250 mg via ORAL
  Filled 2016-08-10 (×11): qty 3

## 2016-08-10 MED ORDER — DILTIAZEM HCL 25 MG/5ML IV SOLN: 15.0000 mg | Freq: Once | INTRAVENOUS | Status: DC

## 2016-08-10 MED ORDER — DILTIAZEM HCL 100 MG IV SOLR: 5.0000 mg/h | Freq: Once | INTRAVENOUS | Status: DC

## 2016-08-10 MED ORDER — DILTIAZEM HCL ER BEADS 300 MG PO CP24
300.0000 mg | ORAL_CAPSULE | Freq: Every day | ORAL | Status: DC
  Administered 2016-08-11 – 2016-08-12 (×2): 300 mg via ORAL
  Filled 2016-08-10 (×2): qty 1

## 2016-08-10 MED ORDER — METOPROLOL SUCCINATE ER 100 MG PO TB24
100.0000 mg | ORAL_TABLET | Freq: Every day | ORAL | Status: DC
  Administered 2016-08-10 – 2016-08-18 (×9): 100 mg via ORAL
  Filled 2016-08-10: qty 2
  Filled 2016-08-10: qty 1
  Filled 2016-08-10: qty 2
  Filled 2016-08-10 (×3): qty 1
  Filled 2016-08-10 (×2): qty 2
  Filled 2016-08-10: qty 1

## 2016-08-10 MED ORDER — FUROSEMIDE 40 MG PO TABS
40.0000 mg | ORAL_TABLET | Freq: Every day | ORAL | Status: DC | PRN
Start: 2016-08-10 — End: 2016-08-12

## 2016-08-10 MED ORDER — DILTIAZEM LOAD VIA INFUSION
15.0000 mg | Freq: Once | INTRAVENOUS | Status: DC
  Filled 2016-08-10: qty 15

## 2016-08-10 MED ORDER — LORAZEPAM 2 MG/ML IJ SOLN
1.0000 mg | Freq: Once | INTRAMUSCULAR | Status: AC
  Administered 2016-08-10: 1 mg via INTRAVENOUS

## 2016-08-10 MED ORDER — PNEUMOCOCCAL VAC POLYVALENT 25 MCG/0.5ML IJ INJ
0.5000 mL | INJECTION | INTRAMUSCULAR | Status: DC
  Filled 2016-08-10: qty 0.5

## 2016-08-10 MED ORDER — LORAZEPAM 2 MG/ML IJ SOLN
1.0000 mg | Freq: Four times a day (QID) | INTRAMUSCULAR | Status: DC | PRN
  Administered 2016-08-11: 1 mg via INTRAVENOUS
  Filled 2016-08-10: qty 1

## 2016-08-10 MED ORDER — FOLIC ACID 1 MG PO TABS
1.0000 mg | ORAL_TABLET | Freq: Every day | ORAL | Status: DC
  Administered 2016-08-10 – 2016-08-20 (×11): 1 mg via ORAL
  Filled 2016-08-10 (×11): qty 1

## 2016-08-10 MED ORDER — SODIUM CHLORIDE 0.9% FLUSH
3.0000 mL | Freq: Two times a day (BID) | INTRAVENOUS | Status: DC
Start: 2016-08-10 — End: 2016-08-20
  Administered 2016-08-10 – 2016-08-20 (×13): 3 mL via INTRAVENOUS

## 2016-08-10 MED ORDER — ALLOPURINOL 100 MG PO TABS
300.0000 mg | ORAL_TABLET | Freq: Every day | ORAL | Status: DC
  Administered 2016-08-10 – 2016-08-20 (×11): 300 mg via ORAL
  Filled 2016-08-10 (×2): qty 3
  Filled 2016-08-10 (×2): qty 1
  Filled 2016-08-10 (×2): qty 3
  Filled 2016-08-10: qty 1
  Filled 2016-08-10 (×3): qty 3
  Filled 2016-08-10: qty 1

## 2016-08-10 MED ORDER — TEMAZEPAM 15 MG PO CAPS
30.0000 mg | ORAL_CAPSULE | Freq: Every evening | ORAL | Status: DC | PRN
  Administered 2016-08-11 – 2016-08-19 (×9): 30 mg via ORAL
  Filled 2016-08-10 (×4): qty 2
  Filled 2016-08-10: qty 4
  Filled 2016-08-10 (×4): qty 2

## 2016-08-10 MED ORDER — DILTIAZEM HCL 100 MG IV SOLR
5.0000 mg/h | INTRAVENOUS | Status: DC
  Administered 2016-08-10: 5 mg/h via INTRAVENOUS
  Administered 2016-08-10: 10 mg/h via INTRAVENOUS
  Administered 2016-08-11: 15 mg/h via INTRAVENOUS
  Filled 2016-08-10 (×4): qty 100

## 2016-08-10 MED ORDER — DILTIAZEM LOAD VIA INFUSION
15.0000 mg | Freq: Once | INTRAVENOUS | Status: AC
  Administered 2016-08-10: 15 mg via INTRAVENOUS
  Filled 2016-08-10: qty 15

## 2016-08-10 MED ORDER — INSULIN ASPART 100 UNIT/ML ~~LOC~~ SOLN
0.0000 [IU] | Freq: Three times a day (TID) | SUBCUTANEOUS | Status: DC
  Administered 2016-08-11: 3 [IU] via SUBCUTANEOUS
  Administered 2016-08-11 – 2016-08-12 (×3): 2 [IU] via SUBCUTANEOUS
  Administered 2016-08-12: 3 [IU] via SUBCUTANEOUS
  Administered 2016-08-12 – 2016-08-13 (×2): 2 [IU] via SUBCUTANEOUS
  Administered 2016-08-14 – 2016-08-15 (×2): 3 [IU] via SUBCUTANEOUS
  Administered 2016-08-16: 2 [IU] via SUBCUTANEOUS
  Administered 2016-08-17: 3 [IU] via SUBCUTANEOUS
  Administered 2016-08-17 – 2016-08-18 (×3): 2 [IU] via SUBCUTANEOUS
  Administered 2016-08-19: 3 [IU] via SUBCUTANEOUS
  Administered 2016-08-20: 2 [IU] via SUBCUTANEOUS
  Filled 2016-08-10: qty 4
  Filled 2016-08-10: qty 3
  Filled 2016-08-10 (×4): qty 2
  Filled 2016-08-10: qty 3
  Filled 2016-08-10: qty 2
  Filled 2016-08-10 (×2): qty 3
  Filled 2016-08-10 (×2): qty 2
  Filled 2016-08-10: qty 3
  Filled 2016-08-10 (×3): qty 2

## 2016-08-10 MED ORDER — IOPAMIDOL (ISOVUE-300) INJECTION 61%
100.0000 mL | Freq: Once | INTRAVENOUS | Status: AC | PRN
  Administered 2016-08-10: 100 mL via INTRAVENOUS

## 2016-08-10 NOTE — ED Triage Notes (Signed)
Pt in via EMS from home; reports a fall last night in the bathroom, pt states, "I think I tripped over a rug in the bathroom."  Pt able to get himself back to bed.  This morning, pt's girl friend called EMS due to swelling to jaw and significant amount of blood in bathroom from the fall.  Pt with complaints of left jaw pain, swelling noted, also complaints of left rib pain, bruising noted.  Pt A/Ox4, heart rate 201 upon arrival.  Pt with hx of Afibb, states he has not taken his morning medications.  Pt denies chest pain, shortness of breath.  MD to bedside upon arrival.

## 2016-08-10 NOTE — Consult Note (Signed)
Name: JESSY CYBULSKI MRN: [id number] DOB: August 02, 1945    ADMISSION DATE:  08/10/2016 CONSULTATION DATE:  08/10/2016  REFERRING MD :  Dr. Ether Griffins  CHIEF COMPLAINT: Fall  BRIEF PATIENT DESCRIPTION:  71 yo male admitted 04/30 post fall with right sided nondisplaced 4th and 5th rib fractures, afibb/rvr requiring cardizem gtt, and hypertension  SIGNIFICANT EVENTS  04/30-Pt admitted to [facility]  STUDIES:  CT Chest 04/30>>Right fifth rib fracture with some callus formation likely related to a subacute injury. An undisplaced fourth rib fracture on the right is noted as well. No left-sided rib fractures are seen. T3 and T12 compression deformities of uncertain chronicity. Correlation to point tenderness is recommended. MRI may be helpful as clinically indicated to assess for chronicity. No pneumothorax. CT Head 04/30>>negative CT Abd Pelvis 04/30>>No visceral injury within the abdomen or pelvis CT Maxillofacial 04/30>>No evidence of orbital or facial fracture. Right paranasal sinusitis.  HISTORY OF PRESENT ILLNESS:   This is 71 yo male with PMH of OSA (CPAP qhs), Obesity, HTN, Hyperlipidemia, Gout, Chronic systolic CHF, ETOH abuse, GERD, Cardiomyopathy, Benign prostatic hyperplasia, and Chronic afibb.  He presented to [facility] ER 04/30 post fall from home.  Per ER notes the pt was at home and fell earlier today 04/30 from tripping on a bathroom rug he never loss consciousness, however post fall he developed left-sided rib pain and jaw pain with swelling prompting visit to ER.  Upon arrival to the ER it was noted the pt was in afibb with rvr heart rate 967-591'M, systolic bp 384'Y, and CT chest revealed right sided 4th and 5th rib fractures but no left sided rib fractures noted. In the ER he received 35 mg iv bolus of cardizem and cardizem gtt was initiated. Therefore, pt was admitted to the [facility] by hospitalist for further workup and evaluation.   PAST MEDICAL HISTORY :   has a past  medical history of A-fib ([place]); Benign prostatic hyperplasia; Cardiomyopathy, secondary ([place]); GERD (gastroesophageal reflux disease); Gout; Hyperlipidemia; Hypertension; Obesity; and Sleep apnea.  has a past surgical history that includes Appendectomy; Joint replacement; Colonoscopy; Esophagogastroduodenoscopy; and Colonoscopy with propofol (N/A, 11/29/2014). Prior to Admission medications   Medication Sig Start Date End Date Taking? Authorizing Provider  allopurinol (ZYLOPRIM) 300 MG tablet Take 300 mg by mouth daily.   Yes Historical Provider, MD  apixaban (ELIQUIS) 5 MG TABS tablet Take 5 mg by mouth 2 (two) times daily.   Yes Historical Provider, MD  diltiazem (TIAZAC) 300 MG 24 hr capsule Take 300 mg by mouth daily.   Yes Historical Provider, MD  furosemide (LASIX) 40 MG tablet Take 40 mg by mouth daily.   Yes Historical Provider, MD  lisinopril (PRINIVIL,ZESTRIL) 5 MG tablet Take 5 mg by mouth daily.   Yes Historical Provider, MD  metoprolol succinate (TOPROL-XL) 100 MG 24 hr tablet Take 100 mg by mouth daily. Take with or immediately following a meal.   Yes Historical Provider, MD  Multiple Vitamin (MULTIVITAMIN) tablet Take by mouth daily.   Yes Historical Provider, MD  potassium chloride SA (K-DUR,KLOR-CON) 20 MEQ tablet Take 20 mEq by mouth 2 (two) times daily.   Yes Historical Provider, MD  rOPINIRole (REQUIP) 0.5 MG tablet Take 0.5 mg by mouth 3 (three) times daily.   Yes Historical Provider, MD  ibuprofen (ADVIL,MOTRIN) 100 MG/5ML suspension Take 200 mg by mouth every 4 (four) hours as needed.    Historical Provider, MD  temazepam (RESTORIL) 30 MG capsule Take 30 mg by mouth  at bedtime as needed for sleep.    Historical Provider, MD   Allergies  Allergen Reactions  . Cardura [Doxazosin Mesylate]   . Darvon [Propoxyphene]   . Penicillins   . Requip [Ropinirole Hcl]   . Septra [Sulfamethoxazole-Trimethoprim]     FAMILY HISTORY:  family history is not on file. SOCIAL HISTORY:   reports that he has been smoking Cigars.  He has never used smokeless tobacco. He reports that he drinks alcohol. He reports that he does not use drugs.  REVIEW OF SYSTEMS:  Positives in BOLD Constitutional: Negative for fever, chills, weight loss, malaise/fatigue and diaphoresis.  HENT: left sided jaw pain, hearing loss, ear pain, nosebleeds, congestion, sore throat, neck pain, tinnitus and ear discharge.   Eyes: Negative for blurred vision, double vision, photophobia, pain, discharge and redness.  Respiratory: Negative for cough, hemoptysis, sputum production, shortness of breath, wheezing and stridor.   Cardiovascular: left sided rib pain, chest pain, palpitations, orthopnea, claudication, leg swelling and PND.  Gastrointestinal: Negative for heartburn, nausea, vomiting, abdominal pain, diarrhea, constipation, blood in stool and melena.  Genitourinary: Negative for dysuria, urgency, frequency, hematuria and flank pain.  Musculoskeletal: Negative for myalgias, back pain, joint pain and falls.  Skin: Negative for itching and rash.  Neurological: Negative for dizziness, tingling, tremors, sensory change, speech change, focal weakness, seizures, loss of consciousness, weakness and headaches.  Endo/Heme/Allergies: Negative for environmental allergies and polydipsia. Does not bruise/bleed easily.  SUBJECTIVE:  Pt states he is still having left sided rib pain no other complaints at this time   VITAL SIGNS: Temp:  [98.3 F (36.8 C)-99.1 F (37.3 C)] 98.3 F (36.8 C) (04/30 2000) Pulse Rate:  [87-169] 134 (04/30 2100) Resp:  [17-30] 18 (04/30 2100) BP: (144-194)/(94-129) 166/129 (04/30 2100) SpO2:  [90 %-95 %] 93 % (04/30 2100) Weight:  [97.5 kg (215 lb)] 97.5 kg (215 lb) (04/30 1359)  PHYSICAL EXAMINATION: General: well developed, well nourished obese Caucasian male, NAD Neuro: alert and oriented, follows commands  HEENT: supple, no JVD Cardiovascular: irregular, irregular, no  M/R/G Lungs: clear throughout, even, non labored Abdomen: +BS x4, soft, non tender, non distended, obese Musculoskeletal: normal bulk and tone no edema Skin: intact no rashes, scabbed abrasion right hand   Recent Labs Lab 08/10/16 1359  NA 139  K 4.1  CL 101  CO2 22  BUN 11  CREATININE 0.70  GLUCOSE 160*    Recent Labs Lab 08/10/16 1359  HGB 15.7  HCT 46.8  WBC 16.9*  PLT 195   Ct Head Wo Contrast  Result Date: 08/10/2016 CLINICAL DATA:  Fall last night.  Facial swelling.  Left jaw pain. EXAM: CT HEAD WITHOUT CONTRAST CT MAXILLOFACIAL WITHOUT CONTRAST TECHNIQUE: Multidetector CT imaging of the head and maxillofacial structures were performed using the standard protocol without intravenous contrast. Multiplanar CT image reconstructions of the maxillofacial structures were also generated. COMPARISON:  None. FINDINGS: CT HEAD FINDINGS Brain: There is atrophy and chronic small vessel disease changes. No acute intracranial abnormality. Specifically, no hemorrhage, hydrocephalus, mass lesion, acute infarction, or significant intracranial injury. Vascular: No hyperdense vessel or unexpected calcification. Skull: No acute calvarial abnormality. Other: None CT MAXILLOFACIAL FINDINGS Osseous: No evidence of facial fracture. Zygomatic arches and mandible are intact. Orbits: No evidence of orbital fracture. Orbital soft tissues unremarkable. Sinuses: Complete opacification of the right maxillary sinus. Mucosal thickening and opacified scattered right ethmoid air cells and mucosal thickening in the right frontal sinus. Mastoid air cells are clear. Soft tissues: Mild soft tissue  swelling over the lower face, left greater than right. IMPRESSION: No acute intracranial abnormality. Atrophy, chronic small vessel disease. No evidence of orbital or facial fracture. Right paranasal sinusitis. Electronically Signed   By: Rolm Baptise M.D.   On: 08/10/2016 15:08   Ct Chest W Contrast  Result Date:  08/10/2016 CLINICAL DATA:  Recent fall with rib pain and left-sided bruising EXAM: CT CHEST, ABDOMEN, AND PELVIS WITH CONTRAST TECHNIQUE: Multidetector CT imaging of the chest, abdomen and pelvis was performed following the standard protocol during bolus administration of intravenous contrast. CONTRAST:  162mL ISOVUE-300 IOPAMIDOL (ISOVUE-300) INJECTION 61% COMPARISON:  None. FINDINGS: CT CHEST FINDINGS Cardiovascular: Atherosclerotic calcifications of the thoracic aorta are noted. No aneurysmal dilatation or dissection is seen. Coronary calcifications are noted. No definitive pulmonary emboli are seen. Mild cardiac enlargement is noted. Mediastinum/Nodes: Thoracic inlet is within normal limits. No hilar or mediastinal adenopathy is seen. The esophagus as visualize is within normal limits. Lungs/Pleura: Lungs are clear. No pleural effusion or pneumothorax. Musculoskeletal: Right fifth rib fracture with healing is noted. This is likely subacute. Undisplaced right fourth rib fractures noted. T3 compression deformity is noted as well as a T12 compression deformity. These are of uncertain chronicity. CT ABDOMEN PELVIS FINDINGS Hepatobiliary: Diffuse fatty infiltration of the liver is noted. The gallbladder is within normal limits. Pancreas: Unremarkable. No pancreatic ductal dilatation or surrounding inflammatory changes. Spleen: Normal in size without focal abnormality. Adrenals/Urinary Tract: The adrenal glands are within normal limits. Multiple tiny nonobstructing renal stones are noted bilaterally. Some renal vascular calcifications are noted as well. The bladder is partially distended. Delayed images demonstrate normal excretion of contrast material. Stomach/Bowel: The appendix is been surgically removed. Scattered diverticular change of the colon is noted. No evidence of diverticulitis is seen. No obstructive changes or significant inflammatory changes are noted. Vascular/Lymphatic: Aortic atherosclerosis. No  enlarged abdominal or pelvic lymph nodes. Reproductive: Prostate is unremarkable. Other: No abdominal wall hernia or abnormality. No abdominopelvic ascites. Musculoskeletal: Bilateral hip replacements are noted. Degenerative changes of the lumbar spine are seen. IMPRESSION: Right fifth rib fracture with some callus formation likely related to a subacute injury. An undisplaced fourth rib fracture on the right is noted as well. No left-sided rib fractures are seen. T3 and T12 compression deformities of uncertain chronicity. Correlation to point tenderness is recommended. MRI may be helpful as clinically indicated to assess for chronicity. No pneumothorax. No visceral injury within the abdomen or pelvis. Electronically Signed   By: Inez Catalina M.D.   On: 08/10/2016 15:22   Ct Abdomen Pelvis W Contrast  Result Date: 08/10/2016 CLINICAL DATA:  Recent fall with rib pain and left-sided bruising EXAM: CT CHEST, ABDOMEN, AND PELVIS WITH CONTRAST TECHNIQUE: Multidetector CT imaging of the chest, abdomen and pelvis was performed following the standard protocol during bolus administration of intravenous contrast. CONTRAST:  173mL ISOVUE-300 IOPAMIDOL (ISOVUE-300) INJECTION 61% COMPARISON:  None. FINDINGS: CT CHEST FINDINGS Cardiovascular: Atherosclerotic calcifications of the thoracic aorta are noted. No aneurysmal dilatation or dissection is seen. Coronary calcifications are noted. No definitive pulmonary emboli are seen. Mild cardiac enlargement is noted. Mediastinum/Nodes: Thoracic inlet is within normal limits. No hilar or mediastinal adenopathy is seen. The esophagus as visualize is within normal limits. Lungs/Pleura: Lungs are clear. No pleural effusion or pneumothorax. Musculoskeletal: Right fifth rib fracture with healing is noted. This is likely subacute. Undisplaced right fourth rib fractures noted. T3 compression deformity is noted as well as a T12 compression deformity. These are of uncertain chronicity. CT  ABDOMEN PELVIS FINDINGS Hepatobiliary: Diffuse fatty infiltration of the liver is noted. The gallbladder is within normal limits. Pancreas: Unremarkable. No pancreatic ductal dilatation or surrounding inflammatory changes. Spleen: Normal in size without focal abnormality. Adrenals/Urinary Tract: The adrenal glands are within normal limits. Multiple tiny nonobstructing renal stones are noted bilaterally. Some renal vascular calcifications are noted as well. The bladder is partially distended. Delayed images demonstrate normal excretion of contrast material. Stomach/Bowel: The appendix is been surgically removed. Scattered diverticular change of the colon is noted. No evidence of diverticulitis is seen. No obstructive changes or significant inflammatory changes are noted. Vascular/Lymphatic: Aortic atherosclerosis. No enlarged abdominal or pelvic lymph nodes. Reproductive: Prostate is unremarkable. Other: No abdominal wall hernia or abnormality. No abdominopelvic ascites. Musculoskeletal: Bilateral hip replacements are noted. Degenerative changes of the lumbar spine are seen. IMPRESSION: Right fifth rib fracture with some callus formation likely related to a subacute injury. An undisplaced fourth rib fracture on the right is noted as well. No left-sided rib fractures are seen. T3 and T12 compression deformities of uncertain chronicity. Correlation to point tenderness is recommended. MRI may be helpful as clinically indicated to assess for chronicity. No pneumothorax. No visceral injury within the abdomen or pelvis. Electronically Signed   By: Inez Catalina M.D.   On: 08/10/2016 15:22   Ct Maxillofacial Wo Contrast  Result Date: 08/10/2016 CLINICAL DATA:  Fall last night.  Facial swelling.  Left jaw pain. EXAM: CT HEAD WITHOUT CONTRAST CT MAXILLOFACIAL WITHOUT CONTRAST TECHNIQUE: Multidetector CT imaging of the head and maxillofacial structures were performed using the standard protocol without intravenous contrast.  Multiplanar CT image reconstructions of the maxillofacial structures were also generated. COMPARISON:  None. FINDINGS: CT HEAD FINDINGS Brain: There is atrophy and chronic small vessel disease changes. No acute intracranial abnormality. Specifically, no hemorrhage, hydrocephalus, mass lesion, acute infarction, or significant intracranial injury. Vascular: No hyperdense vessel or unexpected calcification. Skull: No acute calvarial abnormality. Other: None CT MAXILLOFACIAL FINDINGS Osseous: No evidence of facial fracture. Zygomatic arches and mandible are intact. Orbits: No evidence of orbital fracture. Orbital soft tissues unremarkable. Sinuses: Complete opacification of the right maxillary sinus. Mucosal thickening and opacified scattered right ethmoid air cells and mucosal thickening in the right frontal sinus. Mastoid air cells are clear. Soft tissues: Mild soft tissue swelling over the lower face, left greater than right. IMPRESSION: No acute intracranial abnormality. Atrophy, chronic small vessel disease. No evidence of orbital or facial fracture. Right paranasal sinusitis. Electronically Signed   By: Rolm Baptise M.D.   On: 08/10/2016 15:08    ASSESSMENT / PLAN: Atrial Fibrillation with RVR  Hypertension  Acute pain s/p fall with right sided 4th and 5th nondisplaced rib fractures Hx: ETOH Abuse, OSA, Obesity, Chronic A-fibb on outpatient eliquis, and Chronic systolic CHF P: Supplemental O2 to maintain O2 sats >92% Trend troponin's Continuous telemetry monitoring Continue Cardizem gtt for hr >115 Cardiology consulted appreciate input  Continue outpatient apixaban, aspirin, lisinopril, and metoprolol Prn hydralazine for systolic >297 or diastolic >989 Prn tramadol and tylenol for pain management  CIWA protocol  Prn ativan for ETOH withdrawal s/sx Continue thiamine, mvi, and folic acid CBG's ac/hs and SSI   Marda Stalker, [place] Pager [phone] (please enter 7  digits) PCCM Consult Pager [phone] (please enter 7 [phone])   Merton Border, MD PCCM service Mobile [phone] Pager [phone] 08/11/2016 8:28 AM

## 2016-08-10 NOTE — ED Provider Notes (Signed)
-----------------------------------------   4:40 PM on 08/10/2016 -----------------------------------------  CT chest/abd pel  IMPRESSION: Right fifth rib fracture with some callus formation likely related to a subacute injury. An undisplaced fourth rib fracture on the right is noted as well. No left-sided rib fractures are seen.  T3 and T12 compression deformities of uncertain chronicity. Correlation to point tenderness is recommended. MRI may be helpful as clinically indicated to assess for chronicity.  No pneumothorax.  No visceral injury within the abdomen or pelvis.  CT head/max face IMPRESSION: No acute intracranial abnormality. Atrophy, chronic small vessel disease.  No evidence of orbital or facial fracture.  Right paranasal sinusitis.  Patient continues to have afib with rapid ventricular rate after two boluses of diltiazem. Given continued RVR will plan on starting diltiazem drip. Plan on admission to hospitalist service.    Nance Pear, MD 08/10/16 [phone]

## 2016-08-10 NOTE — ED Provider Notes (Signed)
[facility] Emergency Department Provider Note  ____________________________________________   First MD Initiated Contact with Patient 08/10/16 1411     (approximate)  I have reviewed the triage vital signs and the nursing notes.   HISTORY  Chief Complaint Fall   HPI Brendan Edwards is a 71 y.o. male with a history of atrial fibrillation on eliquis was presenting to the emergency department after a fall last night. He is unsure of exactly what time he fell but thinks that he tripped over the management in the bathroom and fell onto a wooden hamper. He says that he is having pain now on his left thorax as well as the upper left abdomen. He is not reporting any pain in his back. No headache or neck pain. Says that his jaw hurts. EMS reported there was a lot of blood on his bed in the house. His significant other called ambulance because of concern. Patient does not know the date of his last tetanus shot.  In route with EMS patient was found a heart rate of 150-200. He has a history of atrial fibrillation and did not take his medications this morning.  Past Medical History:  Diagnosis Date  . A-fib ([place])   . Benign prostatic hyperplasia   . Cardiomyopathy, secondary ([place])   . GERD (gastroesophageal reflux disease)   . Gout   . Hyperlipidemia   . Hypertension   . Obesity   . Sleep apnea     There are no active problems to display for this patient.   Past Surgical History:  Procedure Laterality Date  . APPENDECTOMY    . COLONOSCOPY    . COLONOSCOPY WITH PROPOFOL N/A 11/29/2014   Procedure: COLONOSCOPY WITH PROPOFOL;  Surgeon: Hulen Luster, MD;  Location: [facility] ENDOSCOPY;  Service: Gastroenterology;  Laterality: N/A;  . ESOPHAGOGASTRODUODENOSCOPY    . JOINT REPLACEMENT     right and left hip    Prior to Admission medications   Medication Sig Start Date End Date Taking? Authorizing Provider  allopurinol (ZYLOPRIM) 300 MG tablet Take 300 mg by mouth  daily.    Historical Provider, MD  apixaban (ELIQUIS) 5 MG TABS tablet Take 5 mg by mouth 2 (two) times daily.    Historical Provider, MD  diltiazem (TIAZAC) 300 MG 24 hr capsule Take 300 mg by mouth daily.    Historical Provider, MD  furosemide (LASIX) 40 MG tablet Take 40 mg by mouth.    Historical Provider, MD  ibuprofen (ADVIL,MOTRIN) 100 MG/5ML suspension Take 200 mg by mouth every 4 (four) hours as needed.    Historical Provider, MD  lisinopril (PRINIVIL,ZESTRIL) 5 MG tablet Take 5 mg by mouth daily.    Historical Provider, MD  metoprolol succinate (TOPROL-XL) 100 MG 24 hr tablet Take 100 mg by mouth daily. Take with or immediately following a meal.    Historical Provider, MD  Multiple Vitamin (MULTIVITAMIN) tablet Take by mouth daily.    Historical Provider, MD  OMEGA-3 FATTY ACIDS PO Take by mouth.    Historical Provider, MD  potassium chloride SA (K-DUR,KLOR-CON) 20 MEQ tablet Take 20 mEq by mouth 2 (two) times daily.    Historical Provider, MD  rOPINIRole (REQUIP) 0.5 MG tablet Take 0.5 mg by mouth 3 (three) times daily.    Historical Provider, MD  temazepam (RESTORIL) 30 MG capsule Take 30 mg by mouth at bedtime as needed for sleep.    Historical Provider, MD    Allergies Cardura [doxazosin mesylate]; Darvon [propoxyphene];  Penicillins; Requip [ropinirole hcl]; and Septra [sulfamethoxazole-trimethoprim]  No family history on file.  Social History Social History  Substance Use Topics  . Smoking status: Current Every Day Smoker    Types: Cigars  . Smokeless tobacco: Never Used  . Alcohol use Yes    Review of Systems  Constitutional: No fever/chills Eyes: No visual changes. ENT: No sore throat. Cardiovascular: as above Respiratory: Denies shortness of breath. Gastrointestinal: no nausea, no vomiting.  No diarrhea.  No constipation. Genitourinary: Negative for dysuria. Musculoskeletal: Negative for back pain. Skin: Negative for rash. Neurological: Negative for headaches,  focal weakness or numbness.   ____________________________________________   PHYSICAL EXAM:  VITAL SIGNS: ED Triage Vitals  Enc Vitals Group     BP 08/10/16 1357 (!) 180/118     Pulse --      Resp 08/10/16 1357 (!) 24     Temp 08/10/16 1357 99.1 F (37.3 C)     Temp Source 08/10/16 1357 Oral     SpO2 08/10/16 1357 94 %     Weight 08/10/16 1359 215 lb (97.5 kg)     Height 08/10/16 1359 5\' 8"  (1.727 m)     Head Circumference --      Peak Flow --      Pain Score 08/10/16 1356 7     Pain Loc --      Pain Edu? --      Excl. in [place]? --     Constitutional: Alert and oriented. Well appearing and in no acute distress. Eyes: Conjunctivae are normal. PERRL. EOMI. Head: Atraumatic. Nose: No congestion/rhinnorhea. Mouth/Throat: Mucous membranes are moist.  Oropharynx non-erythematous. Laceration to mucosa about 1 cm in front of the lower frontal teeth. There is no active bleeding but there is blood clot present. The laceration appears superficial and there is no debris in the laceration. It is not through and through and there is no signs of trauma on the outside of the face. However, there is tenderness over the anterior mandible. No trismus. Neck: No stridor.  No tenderness palpation to the midline C-spine. Patient is able to range his head and neck freely without any paresthesia or pain. Cardiovascular:  Tachycardic with an irregularly irregular rhythm. Respiratory: Normal respiratory effort.  No retractions. Lungs CTAB. Gastrointestinal: Soft with mild tenderness left upper quadrant where there is an area of ecchymosis overlying. No distention. No CVA tenderness. Musculoskeletal: No lower extremity tenderness nor edema.  No joint effusions. Swelling over the dorsum of the right hand the patient says this is a remote injury which happened several weeks ago. There is no tenderness to palpation here and the patient has full range of motion of his hand on the right.  Tenderness palpation to  the left lateral thorax for there is a small amount of ecchymosis as well as crepitus. Neurologic:  Normal speech and language. No gross focal neurologic deficits are appreciated.  Skin:  Skin is warm, dry and intact. No rash noted. Psychiatric: Mood and affect are normal. Speech and behavior are normal.  ____________________________________________   LABS (all labs ordered are listed, but only abnormal results are displayed)  Labs Reviewed  CBC WITH DIFFERENTIAL/PLATELET - Abnormal; Notable for the following:       Result Value   WBC 16.9 (*)    MCV 103.5 (*)    MCH 34.8 (*)    RDW 14.6 (*)    Neutro Abs 14.8 (*)    Lymphs Abs 0.7 (*)    Monocytes Absolute 1.2 (*)  All other components within normal limits  COMPREHENSIVE METABOLIC PANEL - Abnormal; Notable for the following:    Glucose, Bld 160 (*)    Calcium 8.8 (*)    AST 50 (*)    Anion gap 16 (*)    All other components within normal limits  TROPONIN I  LIPASE, BLOOD   ____________________________________________  EKG  ED ECG REPORT I, [place],  Youlanda Roys, the attending physician, personally viewed and interpreted this ECG.   Date: 08/10/2016  EKG Time: 1355  Rate: 184  Rhythm: atrial fibrillation, rate 184  Axis: normal  Intervals:none  ST&T Change: No ST segment elevation. ST depression in the lateral leads which is likely rate related. No abnormal T-wave inversions   ____________________________________________  RADIOLOGY  Pending CAT scans ____________________________________________   PROCEDURES  Procedure(s) performed:   Procedures  Critical Care performed:  CRITICAL CARE Performed by: Doran Stabler   Total critical care time: 35 minutes  Critical care time was exclusive of separately billable procedures and treating other patients.  Critical care was necessary to treat or prevent imminent or life-threatening deterioration.  Critical care was time spent personally by me on the  following activities: development of treatment plan with patient and/or surrogate as well as nursing, discussions with consultants, evaluation of patient's response to treatment, examination of patient, obtaining history from patient or surrogate, ordering and performing treatments and interventions, ordering and review of laboratory studies, ordering and review of radiographic studies, pulse oximetry and re-evaluation of patient's condition.  ____________________________________________   INITIAL IMPRESSION / ASSESSMENT AND PLAN / ED COURSE  Pertinent labs & imaging results that were available during my care of the patient were reviewed by me and considered in my medical decision making (see chart for details).  ----------------------------------------- 3:17 PM on 08/10/2016 -----------------------------------------  Patient with laceration to the inner and lower lip which does not have any active bleeding. There are no food particle stuck there. Unclear exactly what time the laceration was sustained. However, I believe the correct course of action at this time will be to let it heal by secondary intention as it has likely already started to heal and is not actively bleeding at this time. Patient requiring bolus dose of Cardizem and will likely require an additional dose or a drip. Pending CAT scans at this time for his fall and possible subsequent underlying injuries. Signed out to Dr. Archie Balboa.      ____________________________________________   FINAL CLINICAL IMPRESSION(S) / ED DIAGNOSES  Final diagnoses:  Fall  Left-sided thoracic pain. Abdominal ecchymosis. Atrial fibrillation with rapid ventricular response.    NEW MEDICATIONS STARTED DURING THIS VISIT:  New Prescriptions   No medications on file     Note:  This document was prepared using Dragon voice recognition software and may include unintentional dictation errors.    Orbie Pyo, MD 08/10/16 [phone]

## 2016-08-10 NOTE — ED Notes (Addendum)
Pt reports 8/10 left lateral rib pain; Dr Judeen Hammans paged for orders; CCU nurse notified MD to enter orders; cardizem drip empty; pharmacy called and notified to send new bag to CCU; pt tx to CCU by Caryl Pina, RN

## 2016-08-10 NOTE — ED Notes (Signed)
Patient transported to CT 

## 2016-08-10 NOTE — ED Notes (Signed)
Pt provided meal tray

## 2016-08-10 NOTE — H&P (Signed)
[facility] [place] NAME: Brendan Edwards    MR#:  [id number]  DATE OF BIRTH:  [birth year]  DATE OF ADMISSION:  08/10/2016  PRIMARY CARE PHYSICIAN: Tracie Harrier, MD   REQUESTING/REFERRING PHYSICIAN:   CHIEF COMPLAINT:   Chief Complaint  Patient presents with  . Fall    HISTORY OF PRESENT ILLNESS: Brendan Edwards  is a 71 y.o. male with a known history of Multiple medical problems including paroxysmal atrial ablation, chronic systolic CHF, severe sleep apnea, diabetes mellitus type 2, restless leg syndrome, gastric esophageal reflux disease disease, alcohol abuse, hypertension, hyperlipidemia, who presents to the hospital after he fell down earlier today while tripping on the rug in the bathroom, no loss of consciousness reported. Upon fall. Patient developed left-sided rib pain, left-sided jaw pain and swelling, he presented emergency room for further evaluation and treatment, weight was noted to have old right rib fractures, T3, T12 compression fractures. He was also noted to be in A. fib, RVR with heart rate 160-170, as high as 201. His blood pressure is also running high at 180s intermittently. Hospitalist services were contacted for admission  PAST MEDICAL HISTORY:   Past Medical History:  Diagnosis Date  . A-fib ([place])   . Benign prostatic hyperplasia   . Cardiomyopathy, secondary ([place])   . GERD (gastroesophageal reflux disease)   . Gout   . Hyperlipidemia   . Hypertension   . Obesity   . Sleep apnea     PAST SURGICAL HISTORY:  Past Surgical History:  Procedure Laterality Date  . APPENDECTOMY    . COLONOSCOPY    . COLONOSCOPY WITH PROPOFOL N/A 11/29/2014   Procedure: COLONOSCOPY WITH PROPOFOL;  Surgeon: Hulen Luster, MD;  Location: [facility] ENDOSCOPY;  Service: Gastroenterology;  Laterality: N/A;  . ESOPHAGOGASTRODUODENOSCOPY    . JOINT REPLACEMENT     right and left hip    SOCIAL HISTORY:  Social History  Substance Use  Topics  . Smoking status: Current Every Day Smoker    Types: Cigars  . Smokeless tobacco: Never Used  . Alcohol use Yes    FAMILY HISTORY: No family history on file.  DRUG ALLERGIES:  Allergies  Allergen Reactions  . Cardura [Doxazosin Mesylate]   . Darvon [Propoxyphene]   . Penicillins   . Requip [Ropinirole Hcl]   . Septra [Sulfamethoxazole-Trimethoprim]     Review of Systems  Constitutional: Positive for malaise/fatigue. Negative for chills, fever and weight loss.  HENT: Positive for congestion.   Eyes: Negative for blurred vision and double vision.  Respiratory: Positive for cough. Negative for sputum production, shortness of breath and wheezing.   Cardiovascular: Positive for chest pain and leg swelling. Negative for palpitations, orthopnea and PND.  Gastrointestinal: Positive for diarrhea. Negative for abdominal pain, blood in stool, constipation, nausea and vomiting.  Genitourinary: Negative for dysuria, frequency, hematuria and urgency.  Musculoskeletal: Negative for falls.  Neurological: Negative for dizziness, tremors, focal weakness and headaches.  Endo/Heme/Allergies: Does not bruise/bleed easily.  Psychiatric/Behavioral: Negative for depression. The patient does not have insomnia.     MEDICATIONS AT HOME:  Prior to Admission medications   Medication Sig Start Date End Date Taking? Authorizing Provider  allopurinol (ZYLOPRIM) 300 MG tablet Take 300 mg by mouth daily.   Yes Historical Provider, MD  apixaban (ELIQUIS) 5 MG TABS tablet Take 5 mg by mouth 2 (two) times daily.   Yes Historical Provider, MD  diltiazem (TIAZAC) 300 MG 24 hr capsule Take  300 mg by mouth daily.   Yes Historical Provider, MD  furosemide (LASIX) 40 MG tablet Take 40 mg by mouth daily.   Yes Historical Provider, MD  lisinopril (PRINIVIL,ZESTRIL) 5 MG tablet Take 5 mg by mouth daily.   Yes Historical Provider, MD  metoprolol succinate (TOPROL-XL) 100 MG 24 hr tablet Take 100 mg by mouth daily.  Take with or immediately following a meal.   Yes Historical Provider, MD  Multiple Vitamin (MULTIVITAMIN) tablet Take by mouth daily.   Yes Historical Provider, MD  potassium chloride SA (K-DUR,KLOR-CON) 20 MEQ tablet Take 20 mEq by mouth 2 (two) times daily.   Yes Historical Provider, MD  rOPINIRole (REQUIP) 0.5 MG tablet Take 0.5 mg by mouth 3 (three) times daily.   Yes Historical Provider, MD  ibuprofen (ADVIL,MOTRIN) 100 MG/5ML suspension Take 200 mg by mouth every 4 (four) hours as needed.    Historical Provider, MD  temazepam (RESTORIL) 30 MG capsule Take 30 mg by mouth at bedtime as needed for sleep.    Historical Provider, MD      PHYSICAL EXAMINATION:   VITAL SIGNS: Blood pressure (!) 160/94, pulse (!) 157, temperature 99.1 F (37.3 C), temperature source Oral, resp. rate (!) 30, height 5\' 8"  (1.727 m), weight 97.5 kg (215 lb), SpO2 95 %.  GENERAL:  71 y.o.-year-old patient lying in the bed In mild to moderate distress due to chest pains, jaw discomfort.  EYES: Pupils equal, round, reactive to light and accommodation. No scleral icterus. Extraocular muscles intact.  HEENT: Head atraumatic, normocephalic. Oropharynx and nasopharynx clear. Bruise on the  left side of the jaw, which is swollen and tender to touch NECK:  Supple, no jugular venous distention. No thyroid enlargement, no tenderness.  LUNGS: Normal breath sounds bilaterally, no wheezing, rales,rhonchi or crepitation. No use of accessory muscles of respiration.  Chest is tender to palpation on the left side, but not midsternal area, or on the right chest CARDIOVASCULAR: S1, S2 , irregularly irregular, tachycardic. No murmurs, rubs, or gallops.  ABDOMEN: Soft, nontender, nondistended. Bowel sounds present. No organomegaly or mass.  EXTREMITIES: 2-3+ lower extremity and pedal edema bilaterally, no cyanosis, or clubbing.  NEUROLOGIC: Cranial nerves II through XII are intact. Muscle strength 5/5 in all extremities. Sensation  intact. Gait not checked.  PSYCHIATRIC: The patient is alert and oriented x 3. Patient is tremulous. Hard of hearing , agitated intermittently  SKIN: No obvious rash, lesion, or ulcer.   LABORATORY PANEL:   CBC  Recent Labs Lab 08/10/16 1359  WBC 16.9*  HGB 15.7  HCT 46.8  PLT 195  MCV 103.5*  MCH 34.8*  MCHC 33.6  RDW 14.6*  LYMPHSABS 0.7*  MONOABS 1.2*  EOSABS 0.0  BASOSABS 0.1   ------------------------------------------------------------------------------------------------------------------  Chemistries   Recent Labs Lab 08/10/16 1359  NA 139  K 4.1  CL 101  CO2 22  GLUCOSE 160*  BUN 11  CREATININE 0.70  CALCIUM 8.8*  AST 50*  ALT 41  ALKPHOS 91  BILITOT 1.0   ------------------------------------------------------------------------------------------------------------------  Cardiac Enzymes  Recent Labs Lab 08/10/16 1359  TROPONINI <0.03   ------------------------------------------------------------------------------------------------------------------  RADIOLOGY: Ct Head Wo Contrast  Result Date: 08/10/2016 CLINICAL DATA:  Fall last night.  Facial swelling.  Left jaw pain. EXAM: CT HEAD WITHOUT CONTRAST CT MAXILLOFACIAL WITHOUT CONTRAST TECHNIQUE: Multidetector CT imaging of the head and maxillofacial structures were performed using the standard protocol without intravenous contrast. Multiplanar CT image reconstructions of the maxillofacial structures were also generated. COMPARISON:  None.  FINDINGS: CT HEAD FINDINGS Brain: There is atrophy and chronic small vessel disease changes. No acute intracranial abnormality. Specifically, no hemorrhage, hydrocephalus, mass lesion, acute infarction, or significant intracranial injury. Vascular: No hyperdense vessel or unexpected calcification. Skull: No acute calvarial abnormality. Other: None CT MAXILLOFACIAL FINDINGS Osseous: No evidence of facial fracture. Zygomatic arches and mandible are intact. Orbits: No  evidence of orbital fracture. Orbital soft tissues unremarkable. Sinuses: Complete opacification of the right maxillary sinus. Mucosal thickening and opacified scattered right ethmoid air cells and mucosal thickening in the right frontal sinus. Mastoid air cells are clear. Soft tissues: Mild soft tissue swelling over the lower face, left greater than right. IMPRESSION: No acute intracranial abnormality. Atrophy, chronic small vessel disease. No evidence of orbital or facial fracture. Right paranasal sinusitis. Electronically Signed   By: Rolm Baptise M.D.   On: 08/10/2016 15:08   Ct Chest W Contrast  Result Date: 08/10/2016 CLINICAL DATA:  Recent fall with rib pain and left-sided bruising EXAM: CT CHEST, ABDOMEN, AND PELVIS WITH CONTRAST TECHNIQUE: Multidetector CT imaging of the chest, abdomen and pelvis was performed following the standard protocol during bolus administration of intravenous contrast. CONTRAST:  122mL ISOVUE-300 IOPAMIDOL (ISOVUE-300) INJECTION 61% COMPARISON:  None. FINDINGS: CT CHEST FINDINGS Cardiovascular: Atherosclerotic calcifications of the thoracic aorta are noted. No aneurysmal dilatation or dissection is seen. Coronary calcifications are noted. No definitive pulmonary emboli are seen. Mild cardiac enlargement is noted. Mediastinum/Nodes: Thoracic inlet is within normal limits. No hilar or mediastinal adenopathy is seen. The esophagus as visualize is within normal limits. Lungs/Pleura: Lungs are clear. No pleural effusion or pneumothorax. Musculoskeletal: Right fifth rib fracture with healing is noted. This is likely subacute. Undisplaced right fourth rib fractures noted. T3 compression deformity is noted as well as a T12 compression deformity. These are of uncertain chronicity. CT ABDOMEN PELVIS FINDINGS Hepatobiliary: Diffuse fatty infiltration of the liver is noted. The gallbladder is within normal limits. Pancreas: Unremarkable. No pancreatic ductal dilatation or surrounding  inflammatory changes. Spleen: Normal in size without focal abnormality. Adrenals/Urinary Tract: The adrenal glands are within normal limits. Multiple tiny nonobstructing renal stones are noted bilaterally. Some renal vascular calcifications are noted as well. The bladder is partially distended. Delayed images demonstrate normal excretion of contrast material. Stomach/Bowel: The appendix is been surgically removed. Scattered diverticular change of the colon is noted. No evidence of diverticulitis is seen. No obstructive changes or significant inflammatory changes are noted. Vascular/Lymphatic: Aortic atherosclerosis. No enlarged abdominal or pelvic lymph nodes. Reproductive: Prostate is unremarkable. Other: No abdominal wall hernia or abnormality. No abdominopelvic ascites. Musculoskeletal: Bilateral hip replacements are noted. Degenerative changes of the lumbar spine are seen. IMPRESSION: Right fifth rib fracture with some callus formation likely related to a subacute injury. An undisplaced fourth rib fracture on the right is noted as well. No left-sided rib fractures are seen. T3 and T12 compression deformities of uncertain chronicity. Correlation to point tenderness is recommended. MRI may be helpful as clinically indicated to assess for chronicity. No pneumothorax. No visceral injury within the abdomen or pelvis. Electronically Signed   By: Inez Catalina M.D.   On: 08/10/2016 15:22   Ct Abdomen Pelvis W Contrast  Result Date: 08/10/2016 CLINICAL DATA:  Recent fall with rib pain and left-sided bruising EXAM: CT CHEST, ABDOMEN, AND PELVIS WITH CONTRAST TECHNIQUE: Multidetector CT imaging of the chest, abdomen and pelvis was performed following the standard protocol during bolus administration of intravenous contrast. CONTRAST:  125mL ISOVUE-300 IOPAMIDOL (ISOVUE-300) INJECTION 61% COMPARISON:  None.  FINDINGS: CT CHEST FINDINGS Cardiovascular: Atherosclerotic calcifications of the thoracic aorta are noted. No  aneurysmal dilatation or dissection is seen. Coronary calcifications are noted. No definitive pulmonary emboli are seen. Mild cardiac enlargement is noted. Mediastinum/Nodes: Thoracic inlet is within normal limits. No hilar or mediastinal adenopathy is seen. The esophagus as visualize is within normal limits. Lungs/Pleura: Lungs are clear. No pleural effusion or pneumothorax. Musculoskeletal: Right fifth rib fracture with healing is noted. This is likely subacute. Undisplaced right fourth rib fractures noted. T3 compression deformity is noted as well as a T12 compression deformity. These are of uncertain chronicity. CT ABDOMEN PELVIS FINDINGS Hepatobiliary: Diffuse fatty infiltration of the liver is noted. The gallbladder is within normal limits. Pancreas: Unremarkable. No pancreatic ductal dilatation or surrounding inflammatory changes. Spleen: Normal in size without focal abnormality. Adrenals/Urinary Tract: The adrenal glands are within normal limits. Multiple tiny nonobstructing renal stones are noted bilaterally. Some renal vascular calcifications are noted as well. The bladder is partially distended. Delayed images demonstrate normal excretion of contrast material. Stomach/Bowel: The appendix is been surgically removed. Scattered diverticular change of the colon is noted. No evidence of diverticulitis is seen. No obstructive changes or significant inflammatory changes are noted. Vascular/Lymphatic: Aortic atherosclerosis. No enlarged abdominal or pelvic lymph nodes. Reproductive: Prostate is unremarkable. Other: No abdominal wall hernia or abnormality. No abdominopelvic ascites. Musculoskeletal: Bilateral hip replacements are noted. Degenerative changes of the lumbar spine are seen. IMPRESSION: Right fifth rib fracture with some callus formation likely related to a subacute injury. An undisplaced fourth rib fracture on the right is noted as well. No left-sided rib fractures are seen. T3 and T12 compression  deformities of uncertain chronicity. Correlation to point tenderness is recommended. MRI may be helpful as clinically indicated to assess for chronicity. No pneumothorax. No visceral injury within the abdomen or pelvis. Electronically Signed   By: Inez Catalina M.D.   On: 08/10/2016 15:22   Ct Maxillofacial Wo Contrast  Result Date: 08/10/2016 CLINICAL DATA:  Fall last night.  Facial swelling.  Left jaw pain. EXAM: CT HEAD WITHOUT CONTRAST CT MAXILLOFACIAL WITHOUT CONTRAST TECHNIQUE: Multidetector CT imaging of the head and maxillofacial structures were performed using the standard protocol without intravenous contrast. Multiplanar CT image reconstructions of the maxillofacial structures were also generated. COMPARISON:  None. FINDINGS: CT HEAD FINDINGS Brain: There is atrophy and chronic small vessel disease changes. No acute intracranial abnormality. Specifically, no hemorrhage, hydrocephalus, mass lesion, acute infarction, or significant intracranial injury. Vascular: No hyperdense vessel or unexpected calcification. Skull: No acute calvarial abnormality. Other: None CT MAXILLOFACIAL FINDINGS Osseous: No evidence of facial fracture. Zygomatic arches and mandible are intact. Orbits: No evidence of orbital fracture. Orbital soft tissues unremarkable. Sinuses: Complete opacification of the right maxillary sinus. Mucosal thickening and opacified scattered right ethmoid air cells and mucosal thickening in the right frontal sinus. Mastoid air cells are clear. Soft tissues: Mild soft tissue swelling over the lower face, left greater than right. IMPRESSION: No acute intracranial abnormality. Atrophy, chronic small vessel disease. No evidence of orbital or facial fracture. Right paranasal sinusitis. Electronically Signed   By: Rolm Baptise M.D.   On: 08/10/2016 15:08    EKG: Orders placed or performed during the hospital encounter of 08/10/16  . EKG 12-Lead  . EKG 12-Lead  EKG in the emergency room revealed A.  fib with RVR at a rate of 184 bpm, normal axis, Q waves in V3, nonspecific ST-T changes  IMPRESSION AND PLAN:  Active Problems:   Atrial fibrillation  with RVR ([place])   Diabetes ([place])   Leukocytosis   Fall   Alcoholic hepatitis   Alcohol withdrawal ([place])   Tobacco abuse counseling  #1. A. fib, RVR, admit patient to medical floor, continue Cardizem IV drip, resume outpatient medications including Cardizem CD and metoprolol, try 1 dose of intravenous metoprolol, advanced medications as needed, continue Eliquis, get cardiologist involved for recommendations #2. Diabetes mellitus type 2, get hemoglobin A1c, continue outpatient medications, sliding scale insulin while in the hospital #3. Leukocytosis, likely stress related, follow with therapy, no antibody therapy, no obvious infection at present #4. Alcoholic hepatitis, follow LFTs in the morning, get ammonia level, pro-time, platelet count was normal #5. Alcohol withdrawal, initiate CIWA scale, check magnesium level, initiate thiamine, high-dose, folic acid #6. Tobacco abuse. Counseling, discussed this patient for 4 minutes, refused nicotine replacement therapy   All the records are reviewed and case discussed with ED provider. Management plans discussed with the patient, family and they are in agreement.  CODE STATUS: Code Status History    This patient does not have a recorded code status. Please follow your organizational policy for patients in this situation.       TOTAL TIME TAKING CARE OF THIS PATIENT: 60 minutes.    Theodoro Grist M.D on 08/10/2016 at 5:40 PM  Between 7am to 6pm - Pager - [phone] After 6pm go to [email or web address] - password [email or web address] [facility]  Office  [phone]  CC: Primary care physician; Tracie Harrier, MD

## 2016-08-10 NOTE — ED Notes (Signed)
Lab notified to add on CK level

## 2016-08-10 NOTE — ED Notes (Signed)
Pharmacy notified to send Diltiazem

## 2016-08-11 ENCOUNTER — Inpatient Hospital Stay: Payer: PPO

## 2016-08-11 DIAGNOSIS — R0789 Other chest pain: Secondary | ICD-10-CM

## 2016-08-11 DIAGNOSIS — J9811 Atelectasis: Secondary | ICD-10-CM

## 2016-08-11 LAB — BASIC METABOLIC PANEL
Anion gap: 10 (ref 5–15)
BUN: 9 mg/dL (ref 6–20)
CHLORIDE: 97 mmol/L — AB (ref 101–111)
CO2: 29 mmol/L (ref 22–32)
Calcium: 8.4 mg/dL — ABNORMAL LOW (ref 8.9–10.3)
Creatinine, Ser: 0.53 mg/dL — ABNORMAL LOW (ref 0.61–1.24)
GFR calc non Af Amer: >60 mL/min (ref >60)
Glucose, Bld: 135 mg/dL — ABNORMAL HIGH (ref 65–99)
POTASSIUM: 3.5 mmol/L (ref 3.5–5.1)
SODIUM: 136 mmol/L (ref 135–145)

## 2016-08-11 LAB — CBC
HEMATOCRIT: 43.7 % (ref 40.0–52.0)
Hemoglobin: 14.8 g/dL (ref 13.0–18.0)
MCH: 35.2 pg — ABNORMAL HIGH (ref 26.0–34.0)
MCHC: 33.8 g/dL (ref 32.0–36.0)
MCV: 104 fL — ABNORMAL HIGH (ref 80.0–100.0)
Platelets: 168 10*3/uL (ref 150–440)
RBC: 4.2 MIL/uL — AB (ref 4.40–5.90)
RDW: 14.6 % — ABNORMAL HIGH (ref 11.5–14.5)
WBC: 14.9 10*3/uL — AB (ref 3.8–10.6)

## 2016-08-11 LAB — GLUCOSE, CAPILLARY
GLUCOSE-CAPILLARY: 183 mg/dL — AB (ref 65–99)
GLUCOSE-CAPILLARY: 121 mg/dL — AB (ref 65–99)
Glucose-Capillary: 136 mg/dL — ABNORMAL HIGH (ref 65–99)
Glucose-Capillary: 98 mg/dL (ref 65–99)

## 2016-08-11 LAB — TROPONIN I
Troponin I: <0.03 ng/mL (ref <0.03)
Troponin I: <0.03 ng/mL (ref <0.03)

## 2016-08-11 LAB — HEMOGLOBIN A1C
Hgb A1c MFr Bld: 6.3 % — ABNORMAL HIGH (ref 4.8–5.6)
Mean Plasma Glucose: 134 mg/dL

## 2016-08-11 MED ORDER — DIPHENHYDRAMINE HCL 12.5 MG/5ML PO ELIX
12.5000 mg | ORAL_SOLUTION | Freq: Four times a day (QID) | ORAL | Status: DC | PRN
  Filled 2016-08-11: qty 5

## 2016-08-11 MED ORDER — ONDANSETRON HCL 4 MG/2ML IJ SOLN: 4.0000 mg | Freq: Four times a day (QID) | INTRAMUSCULAR | Status: DC | PRN

## 2016-08-11 MED ORDER — NALOXONE HCL 0.4 MG/ML IJ SOLN: 0.4000 mg | INTRAMUSCULAR | Status: DC | PRN

## 2016-08-11 MED ORDER — SODIUM CHLORIDE 0.9% FLUSH: 9.0000 mL | INTRAVENOUS | Status: DC | PRN

## 2016-08-11 MED ORDER — METOPROLOL TARTRATE 5 MG/5ML IV SOLN
2.5000 mg | INTRAVENOUS | Status: DC | PRN
  Administered 2016-08-11 – 2016-08-20 (×5): 5 mg via INTRAVENOUS
  Filled 2016-08-11 (×4): qty 5

## 2016-08-11 MED ORDER — METOPROLOL TARTRATE 5 MG/5ML IV SOLN
5.0000 mg | Freq: Once | INTRAVENOUS | Status: AC
  Administered 2016-08-11: 5 mg via INTRAVENOUS

## 2016-08-11 MED ORDER — MORPHINE SULFATE (PF) 2 MG/ML IV SOLN
1.0000 mg | INTRAVENOUS | Status: AC
  Filled 2016-08-11: qty 1

## 2016-08-11 MED ORDER — LIDOCAINE 5 % EX PTCH
2.0000 | MEDICATED_PATCH | CUTANEOUS | Status: DC
  Administered 2016-08-11 – 2016-08-12 (×2): 1 via TRANSDERMAL
  Administered 2016-08-13 – 2016-08-19 (×7): 2 via TRANSDERMAL
  Filled 2016-08-11 (×9): qty 2

## 2016-08-11 MED ORDER — MAGNESIUM SULFATE 2 GM/50ML IV SOLN
2.0000 g | Freq: Once | INTRAVENOUS | Status: AC
  Administered 2016-08-11: 2 g via INTRAVENOUS
  Filled 2016-08-11: qty 50

## 2016-08-11 MED ORDER — MORPHINE SULFATE 2 MG/ML IV SOLN: INTRAVENOUS | Status: DC

## 2016-08-11 MED ORDER — DIPHENHYDRAMINE HCL 50 MG/ML IJ SOLN: 12.5000 mg | Freq: Four times a day (QID) | INTRAMUSCULAR | Status: DC | PRN

## 2016-08-11 MED ORDER — ORAL CARE MOUTH RINSE
15.0000 mL | Freq: Two times a day (BID) | OROMUCOSAL | Status: DC
  Administered 2016-08-12 – 2016-08-16 (×5): 15 mL via OROMUCOSAL

## 2016-08-11 MED ORDER — OXYCODONE HCL 5 MG PO TABS
5.0000 mg | ORAL_TABLET | Freq: Four times a day (QID) | ORAL | Status: DC
  Administered 2016-08-11: 5 mg via ORAL
  Filled 2016-08-11: qty 1

## 2016-08-11 MED ORDER — MORPHINE SULFATE 2 MG/ML IV SOLN
INTRAVENOUS | Status: DC
  Administered 2016-08-11: 11:00:00 via INTRAVENOUS
  Filled 2016-08-11: qty 30

## 2016-08-11 NOTE — Progress Notes (Signed)
[facility] Progress Note Patient Name: Brendan Edwards DOB: Jul 31, 1945 MRN: [id number]   Date of Service  08/11/2016  HPI/Events of Note  Request for PT assess and treat order.  eICU Interventions  Will order PT Consult - assess and treat.       Intervention Category Minor Interventions: Routine modifications to care plan (e.g. PRN medications for pain, fever)  Brendan Edwards 08/11/2016, 4:00 PM

## 2016-08-11 NOTE — Progress Notes (Signed)
Pt son requested social work consult to discuss medical power of attorney. Pt family states that Pt "friend" Velva Harman is enabling pt drinking problem and does not have patients best interest at heart. Will continue to assess.

## 2016-08-11 NOTE — Progress Notes (Signed)
Brendan Edwards at [place] NAME: Brendan Edwards    MR#:  [id number]  DATE OF BIRTH:  02-13-1946  SUBJECTIVE:  CHIEF COMPLAINT:   Chief Complaint  Patient presents with  . Fall     Came after a fall, found to have rib fracture and also A fib with RVR.    Rate is controlled with cardizem drip, now oral cardizem and metoprolol.    On morphin PCA for pain control.  REVIEW OF SYSTEMS:  CONSTITUTIONAL: No fever, fatigue or weakness.  EYES: No blurred or double vision.  EARS, NOSE, AND THROAT: No tinnitus or ear pain.  RESPIRATORY: No cough, shortness of breath, wheezing or hemoptysis.  CARDIOVASCULAR: positive for chest pain, no orthopnea, edema.  GASTROINTESTINAL: No nausea, vomiting, diarrhea or abdominal pain.  GENITOURINARY: No dysuria, hematuria.  ENDOCRINE: No polyuria, nocturia,  HEMATOLOGY: No anemia, easy bruising or bleeding SKIN: No rash or lesion. MUSCULOSKELETAL: No joint pain or arthritis.   NEUROLOGIC: No tingling, numbness, weakness.  PSYCHIATRY: No anxiety or depression.   ROS  DRUG ALLERGIES:   Allergies  Allergen Reactions  . Cardura [Doxazosin Mesylate]   . Darvon [Propoxyphene]   . Penicillins   . Requip [Ropinirole Hcl]   . Septra [Sulfamethoxazole-Trimethoprim]     VITALS:  Blood pressure (!) 161/99, pulse (!) 106, temperature 97.7 F (36.5 C), temperature source Oral, resp. rate (!) 25, height 5\' 8"  (1.727 m), weight 96.4 kg (212 lb 8.4 oz), SpO2 (!) 88 %.  PHYSICAL EXAMINATION:  GENERAL:  71 y.o.-year-old patient lying in the bed with no acute distress.  EYES: Pupils equal, round, reactive to light and accommodation. No scleral icterus. Extraocular muscles intact.  HEENT: Head atraumatic, normocephalic. Oropharynx and nasopharynx clear.  NECK:  Supple, no jugular venous distention. No thyroid enlargement, no tenderness.  LUNGS: Normal breath sounds bilaterally, no wheezing, rales,rhonchi or crepitation. No use  of accessory muscles of respiration. Tender right side back. CARDIOVASCULAR: S1, S2 fast. No murmurs, rubs, or gallops.  ABDOMEN: Soft, nontender, nondistended. Bowel sounds present. No organomegaly or mass.  EXTREMITIES: No pedal edema, cyanosis, or clubbing.  NEUROLOGIC: Cranial nerves II through XII are intact. Muscle strength 5/5 in all extremities. Sensation intact. Gait not checked.  PSYCHIATRIC: The patient is alert and oriented x 3.  SKIN: No obvious rash, lesion, or ulcer.   Physical Exam LABORATORY PANEL:   CBC  Recent Labs Lab 08/11/16 0216  WBC 14.9*  HGB 14.8  HCT 43.7  PLT 168   ------------------------------------------------------------------------------------------------------------------  Chemistries   Recent Labs Lab 08/10/16 1359 08/11/16 0216  NA 139 136  K 4.1 3.5  CL 101 97*  CO2 22 29  GLUCOSE 160* 135*  BUN 11 9  CREATININE 0.70 0.53*  CALCIUM 8.8* 8.4*  MG 1.1*  --   AST 50*  --   ALT 41  --   ALKPHOS 91  --   BILITOT 1.0  --    ------------------------------------------------------------------------------------------------------------------  Cardiac Enzymes  Recent Labs Lab 08/11/16 0216 08/11/16 0745  TROPONINI <0.03 <0.03   ------------------------------------------------------------------------------------------------------------------  RADIOLOGY:  Ct Head Wo Contrast  Result Date: 08/10/2016 CLINICAL DATA:  Fall last night.  Facial swelling.  Left jaw pain. EXAM: CT HEAD WITHOUT CONTRAST CT MAXILLOFACIAL WITHOUT CONTRAST TECHNIQUE: Multidetector CT imaging of the head and maxillofacial structures were performed using the standard protocol without intravenous contrast. Multiplanar CT image reconstructions of the maxillofacial structures were also generated. COMPARISON:  None. FINDINGS: CT HEAD FINDINGS Brain:  There is atrophy and chronic small vessel disease changes. No acute intracranial abnormality. Specifically, no hemorrhage,  hydrocephalus, mass lesion, acute infarction, or significant intracranial injury. Vascular: No hyperdense vessel or unexpected calcification. Skull: No acute calvarial abnormality. Other: None CT MAXILLOFACIAL FINDINGS Osseous: No evidence of facial fracture. Zygomatic arches and mandible are intact. Orbits: No evidence of orbital fracture. Orbital soft tissues unremarkable. Sinuses: Complete opacification of the right maxillary sinus. Mucosal thickening and opacified scattered right ethmoid air cells and mucosal thickening in the right frontal sinus. Mastoid air cells are clear. Soft tissues: Mild soft tissue swelling over the lower face, left greater than right. IMPRESSION: No acute intracranial abnormality. Atrophy, chronic small vessel disease. No evidence of orbital or facial fracture. Right paranasal sinusitis. Electronically Signed   By: Rolm Baptise M.D.   On: 08/10/2016 15:08   Ct Chest W Contrast  Result Date: 08/10/2016 CLINICAL DATA:  Recent fall with rib pain and left-sided bruising EXAM: CT CHEST, ABDOMEN, AND PELVIS WITH CONTRAST TECHNIQUE: Multidetector CT imaging of the chest, abdomen and pelvis was performed following the standard protocol during bolus administration of intravenous contrast. CONTRAST:  128mL ISOVUE-300 IOPAMIDOL (ISOVUE-300) INJECTION 61% COMPARISON:  None. FINDINGS: CT CHEST FINDINGS Cardiovascular: Atherosclerotic calcifications of the thoracic aorta are noted. No aneurysmal dilatation or dissection is seen. Coronary calcifications are noted. No definitive pulmonary emboli are seen. Mild cardiac enlargement is noted. Mediastinum/Nodes: Thoracic inlet is within normal limits. No hilar or mediastinal adenopathy is seen. The esophagus as visualize is within normal limits. Lungs/Pleura: Lungs are clear. No pleural effusion or pneumothorax. Musculoskeletal: Right fifth rib fracture with healing is noted. This is likely subacute. Undisplaced right fourth rib fractures noted. T3  compression deformity is noted as well as a T12 compression deformity. These are of uncertain chronicity. CT ABDOMEN PELVIS FINDINGS Hepatobiliary: Diffuse fatty infiltration of the liver is noted. The gallbladder is within normal limits. Pancreas: Unremarkable. No pancreatic ductal dilatation or surrounding inflammatory changes. Spleen: Normal in size without focal abnormality. Adrenals/Urinary Tract: The adrenal glands are within normal limits. Multiple tiny nonobstructing renal stones are noted bilaterally. Some renal vascular calcifications are noted as well. The bladder is partially distended. Delayed images demonstrate normal excretion of contrast material. Stomach/Bowel: The appendix is been surgically removed. Scattered diverticular change of the colon is noted. No evidence of diverticulitis is seen. No obstructive changes or significant inflammatory changes are noted. Vascular/Lymphatic: Aortic atherosclerosis. No enlarged abdominal or pelvic lymph nodes. Reproductive: Prostate is unremarkable. Other: No abdominal wall hernia or abnormality. No abdominopelvic ascites. Musculoskeletal: Bilateral hip replacements are noted. Degenerative changes of the lumbar spine are seen. IMPRESSION: Right fifth rib fracture with some callus formation likely related to a subacute injury. An undisplaced fourth rib fracture on the right is noted as well. No left-sided rib fractures are seen. T3 and T12 compression deformities of uncertain chronicity. Correlation to point tenderness is recommended. MRI may be helpful as clinically indicated to assess for chronicity. No pneumothorax. No visceral injury within the abdomen or pelvis. Electronically Signed   By: Inez Catalina M.D.   On: 08/10/2016 15:22   Ct Abdomen Pelvis W Contrast  Result Date: 08/10/2016 CLINICAL DATA:  Recent fall with rib pain and left-sided bruising EXAM: CT CHEST, ABDOMEN, AND PELVIS WITH CONTRAST TECHNIQUE: Multidetector CT imaging of the chest,  abdomen and pelvis was performed following the standard protocol during bolus administration of intravenous contrast. CONTRAST:  162mL ISOVUE-300 IOPAMIDOL (ISOVUE-300) INJECTION 61% COMPARISON:  None. FINDINGS: CT CHEST FINDINGS  Cardiovascular: Atherosclerotic calcifications of the thoracic aorta are noted. No aneurysmal dilatation or dissection is seen. Coronary calcifications are noted. No definitive pulmonary emboli are seen. Mild cardiac enlargement is noted. Mediastinum/Nodes: Thoracic inlet is within normal limits. No hilar or mediastinal adenopathy is seen. The esophagus as visualize is within normal limits. Lungs/Pleura: Lungs are clear. No pleural effusion or pneumothorax. Musculoskeletal: Right fifth rib fracture with healing is noted. This is likely subacute. Undisplaced right fourth rib fractures noted. T3 compression deformity is noted as well as a T12 compression deformity. These are of uncertain chronicity. CT ABDOMEN PELVIS FINDINGS Hepatobiliary: Diffuse fatty infiltration of the liver is noted. The gallbladder is within normal limits. Pancreas: Unremarkable. No pancreatic ductal dilatation or surrounding inflammatory changes. Spleen: Normal in size without focal abnormality. Adrenals/Urinary Tract: The adrenal glands are within normal limits. Multiple tiny nonobstructing renal stones are noted bilaterally. Some renal vascular calcifications are noted as well. The bladder is partially distended. Delayed images demonstrate normal excretion of contrast material. Stomach/Bowel: The appendix is been surgically removed. Scattered diverticular change of the colon is noted. No evidence of diverticulitis is seen. No obstructive changes or significant inflammatory changes are noted. Vascular/Lymphatic: Aortic atherosclerosis. No enlarged abdominal or pelvic lymph nodes. Reproductive: Prostate is unremarkable. Other: No abdominal wall hernia or abnormality. No abdominopelvic ascites. Musculoskeletal:  Bilateral hip replacements are noted. Degenerative changes of the lumbar spine are seen. IMPRESSION: Right fifth rib fracture with some callus formation likely related to a subacute injury. An undisplaced fourth rib fracture on the right is noted as well. No left-sided rib fractures are seen. T3 and T12 compression deformities of uncertain chronicity. Correlation to point tenderness is recommended. MRI may be helpful as clinically indicated to assess for chronicity. No pneumothorax. No visceral injury within the abdomen or pelvis. Electronically Signed   By: Inez Catalina M.D.   On: 08/10/2016 15:22   Dg Chest Port 1 View  Result Date: 08/11/2016 CLINICAL DATA:  Recent fall. EXAM: PORTABLE CHEST 1 VIEW COMPARISON:  CT 08/10/2016. FINDINGS: Stable mild cardiomegaly. Low lung volumes with basilar atelectasis and mild infiltrates. Small left pleural effusion. No pneumothorax. Right posterolateral fifth rib fracture. Right fourth rib fracture best identified by CT. Thoracic spine compression fractures best identified by CT. IMPRESSION: 1. Low lung volumes with basilar atelectasis and basilar infiltrates noted on today's exam. Small left pleural effusion noted on today's exam. 2. Right posterolateral fifth rib fracture again noted. Right fourth rib fracture and thoracic vertebral body compression fractures best identified by prior CT . No pneumothorax. Electronically Signed   By: Marcello Moores  Register   On: 08/11/2016 06:54   Ct Maxillofacial Wo Contrast  Result Date: 08/10/2016 CLINICAL DATA:  Fall last night.  Facial swelling.  Left jaw pain. EXAM: CT HEAD WITHOUT CONTRAST CT MAXILLOFACIAL WITHOUT CONTRAST TECHNIQUE: Multidetector CT imaging of the head and maxillofacial structures were performed using the standard protocol without intravenous contrast. Multiplanar CT image reconstructions of the maxillofacial structures were also generated. COMPARISON:  None. FINDINGS: CT HEAD FINDINGS Brain: There is atrophy and  chronic small vessel disease changes. No acute intracranial abnormality. Specifically, no hemorrhage, hydrocephalus, mass lesion, acute infarction, or significant intracranial injury. Vascular: No hyperdense vessel or unexpected calcification. Skull: No acute calvarial abnormality. Other: None CT MAXILLOFACIAL FINDINGS Osseous: No evidence of facial fracture. Zygomatic arches and mandible are intact. Orbits: No evidence of orbital fracture. Orbital soft tissues unremarkable. Sinuses: Complete opacification of the right maxillary sinus. Mucosal thickening and opacified scattered right ethmoid  air cells and mucosal thickening in the right frontal sinus. Mastoid air cells are clear. Soft tissues: Mild soft tissue swelling over the lower face, left greater than right. IMPRESSION: No acute intracranial abnormality. Atrophy, chronic small vessel disease. No evidence of orbital or facial fracture. Right paranasal sinusitis. Electronically Signed   By: Rolm Baptise M.D.   On: 08/10/2016 15:08    ASSESSMENT AND PLAN:   Active Problems:   Atrial fibrillation with RVR (HCC)   Diabetes (HCC)   Leukocytosis   Fall   Alcoholic hepatitis   Alcohol withdrawal ([place])   Tobacco abuse counseling   #1. A. fib, RVR,   Initially given Cardizem IV drip,  Now on oral Cardizem CD and metoprolol,  continue Eliquis, appreciated cardiologist involved for recommendations   Likely RVR was due to pain. #2. Diabetes mellitus type 2,    Hb A1c- 6.3, continue outpatient medications, sliding scale insulin while in the hospital #3. Leukocytosis, likely stress related, follow with therapy, no antibody therapy, no obvious infection at present #4. Alcoholic hepatitis,    Normal LFT, ammonia and platelets. #5. Alcohol withdrawal, initiate CIWA scale,  Low magnesium level- replaced , initiate thiamine, high-dose, folic acid. No signs of withdrawal. #6. Tobacco abuse. Counseling, discussed this patient for 4 minutes, refused  nicotine replacement therapy #7 pain with rib fractures   Morphin PCA drip.   Will give scheduled oxycodone, to help stop PCA drip.   PT eval.    All the records are reviewed and case discussed with Care Management/Social Workerr. Management plans discussed with the patient, family and they are in agreement.  CODE STATUS: full.  TOTAL TIME TAKING CARE OF THIS PATIENT: 35 minutes.   POSSIBLE D/C IN 1-2 DAYS, DEPENDING ON CLINICAL CONDITION.   Vaughan Basta M.D on 08/11/2016   Between 7am to 6pm - Pager - [phone]  After 6pm go to [email or web address] - password [email or web address] [facility]  Office  [phone]  CC: Primary care physician; Tracie Harrier, MD  Note: This dictation was prepared with Dragon dictation along with smaller phrase technology. Any transcriptional errors that result from this process are unintentional.

## 2016-08-11 NOTE — Progress Notes (Signed)
Pt is in stable condition at this time Pt educated on using PCA multiple times. Pt having periods of agitation. PRN metoprolol 5 mg given 1 time on my shift for increased HR. Full assessment in EPIC. Report given to [place], Therapist, sports.

## 2016-08-11 NOTE — Consult Note (Signed)
[place]  CARDIOLOGY CONSULT NOTE  Patient ID: Brendan Edwards MRN: [id number] DOB/AGE: 18-Jan-1946 71 y.o.  Admit date: 08/10/2016 Referring Physician Dr. Anselm Jungling Primary Physician   Primary Cardiologist Dr. Josefa Half Reason for Consultation afib with rvr  HPI: Pt is a 71 yo male with history of chornic afib treated with cardizem cd 360 daily and metoprolol succinate 100 mg daily for rate control and eliquis 5 mg bid for anticoagulation, history of ef of 40% with global hk and mild mr who sufferred a mechanical fall injuring his left rib causing pain in the lateral side of his chest . He presented to the er with left lateral chest pain and sob and was noted to have hypertension and afib with rvr. He has history of osa treated with cpap, etoh abuse. His vent rate was 160-170 with sbp in the 200's on presentation. Chest ct revealed right sided 4th and 5th rib fx with no pathology on the left. He was given cardizem iv bolus and placed on a drip. His rate has improved.he has ruled out for an mi.   Review of Systems  Constitutional: Negative.   HENT: Negative.   Eyes: Negative.   Respiratory: Positive for shortness of breath.   Cardiovascular: Positive for chest pain.  Gastrointestinal: Negative.   Genitourinary: Negative.   Musculoskeletal: Positive for myalgias.  Skin: Negative.   Neurological: Negative.   Endo/Heme/Allergies: Negative.   Psychiatric/Behavioral: Negative.     Past Medical History:  Diagnosis Date  . A-fib ([place])   . Benign prostatic hyperplasia   . Cardiomyopathy, secondary ([place])   . GERD (gastroesophageal reflux disease)   . Gout   . Hyperlipidemia   . Hypertension   . Obesity   . Sleep apnea     No family history on file.  Social History   Social History  . Marital status: Widowed    Spouse name: N/A  . Number of children: N/A  . Years of education: N/A   Occupational History  . Not on file.    Social History Main Topics  . Smoking status: Current Every Day Smoker    Types: Cigars  . Smokeless tobacco: Never Used  . Alcohol use Yes  . Drug use: No  . Sexual activity: Not on file   Other Topics Concern  . Not on file   Social History Narrative  . No narrative on file    Past Surgical History:  Procedure Laterality Date  . APPENDECTOMY    . COLONOSCOPY    . COLONOSCOPY WITH PROPOFOL N/A 11/29/2014   Procedure: COLONOSCOPY WITH PROPOFOL;  Surgeon: Hulen Luster, MD;  Location: [facility] ENDOSCOPY;  Service: Gastroenterology;  Laterality: N/A;  . ESOPHAGOGASTRODUODENOSCOPY    . JOINT REPLACEMENT     right and left hip     Prescriptions Prior to Admission  Medication Sig Dispense Refill Last Dose  . allopurinol (ZYLOPRIM) 300 MG tablet Take 300 mg by mouth daily.   08/09/2016 at Unknown time  . apixaban (ELIQUIS) 5 MG TABS tablet Take 5 mg by mouth 2 (two) times daily.   08/09/2016 at Unknown time  . diltiazem (TIAZAC) 300 MG 24 hr capsule Take 300 mg by mouth daily.   08/09/2016 at Unknown time  . furosemide (LASIX) 40 MG tablet Take 40 mg by mouth daily.   prn at prn  . lisinopril (PRINIVIL,ZESTRIL) 5 MG tablet Take 5 mg by mouth daily.   08/09/2016 at Unknown time  .  metoprolol succinate (TOPROL-XL) 100 MG 24 hr tablet Take 100 mg by mouth daily. Take with or immediately following a meal.   08/09/2016 at Unknown time  . Multiple Vitamin (MULTIVITAMIN) tablet Take by mouth daily.   Past Week at Unknown time  . potassium chloride SA (K-DUR,KLOR-CON) 20 MEQ tablet Take 20 mEq by mouth 2 (two) times daily.   08/09/2016 at Unknown time  . rOPINIRole (REQUIP) 0.5 MG tablet Take 0.5 mg by mouth 3 (three) times daily.   08/09/2016 at Unknown time  . ibuprofen (ADVIL,MOTRIN) 100 MG/5ML suspension Take 200 mg by mouth every 4 (four) hours as needed.   prn at prn  . temazepam (RESTORIL) 30 MG capsule Take 30 mg by mouth at bedtime as needed for sleep.   prn at prn    Physical Exam: Blood  pressure 127/82, pulse (!) 103, temperature 98.2 F (36.8 C), temperature source Oral, resp. rate 16, height 5\' 8"  (1.727 m), weight 96.4 kg (212 lb 8.4 oz), SpO2 93 %.   Wt Readings from Last 1 Encounters:  08/11/16 96.4 kg (212 lb 8.4 oz)     General appearance: alert and cooperative Resp: clear to auscultation bilaterally Chest wall: left sided chest wall tenderness Cardio: irregularly irregular rhythm GI: soft, non-tender; bowel sounds normal; no masses,  no organomegaly Extremities: extremities normal, atraumatic, no cyanosis or edema Neurologic: Grossly normal  Labs:   Lab Results  Component Value Date   WBC 14.9 (H) 08/11/2016   HGB 14.8 08/11/2016   HCT 43.7 08/11/2016   MCV 104.0 (H) 08/11/2016   PLT 168 08/11/2016    Recent Labs Lab 08/10/16 1359 08/11/16 0216  NA 139 136  K 4.1 3.5  CL 101 97*  CO2 22 29  BUN 11 9  CREATININE 0.70 0.53*  CALCIUM 8.8* 8.4*  PROT 7.6  --   BILITOT 1.0  --   ALKPHOS 91  --   ALT 41  --   AST 50*  --   GLUCOSE 160* 135*   Lab Results  Component Value Date   CKTOTAL 297 08/10/2016   CKMB 0.8 02/09/2012   TROPONINI <0.03 08/11/2016      Radiology: right rib fx EKG: afib with rvr  ASSESSMENT AND PLAN:  Pt with chronic afib rate controlled with cardizem and metoprolol and anticoagulated with eliquis. Presented with rib pain and was noted to have afib with rvr. APpears to have been driven by his rib pain. Rate improved with iv cardizem. Will discontinue iv cardizem and continue with dilt and metoprolol and continue with eliquis. OK to transfer to tele and consider discharge today on current meds.  Signed: Teodoro Spray MD, [facility] 08/11/2016, 8:58 AM

## 2016-08-11 NOTE — Progress Notes (Signed)
Still with severe L sided CP. No respiratory distress. Other than pain, no complaints  Vitals:   08/11/16 0400 08/11/16 0500 08/11/16 0600 08/11/16 0800  BP: 131/87 (!) 156/89 132/84   Pulse: (!) 112 (!) 107 (!) 105   Resp: 15 19 15    Temp:    98.2 F (36.8 C)  TempSrc:    Oral  SpO2: 91% 93% 91%   Weight:  212 lb 8.4 oz (96.4 kg)    Height:       NAD HEENT WNL No wheezes, splinting respirations, bibasilar crackles IRIR, no M Obese, soft, NT, +BS No edema No euro deficits  BMP Latest Ref Rng & Units 08/11/2016 08/10/2016 02/09/2012  Glucose 65 - 99 mg/dL 135(H) 160(H) 144(H)  BUN 6 - 20 mg/dL 9 11 9   Creatinine 0.61 - 1.24 mg/dL 0.53(L) 0.70 0.83  Sodium 135 - 145 mmol/L 136 139 141  Potassium 3.5 - 5.1 mmol/L 3.5 4.1 3.4(L)  Chloride 101 - 111 mmol/L 97(L) 101 103  CO2 22 - 32 mmol/L 29 22 28   Calcium 8.9 - 10.3 mg/dL 8.4(L) 8.8(L) 8.8   CBC Latest Ref Rng & Units 08/11/2016 08/10/2016 02/09/2012  WBC 3.8 - 10.6 K/uL 14.9(H) 16.9(H) 9.0  Hemoglobin 13.0 - 18.0 g/dL 14.8 15.7 16.8  Hematocrit 40.0 - 52.0 % 43.7 46.8 49.4  Platelets 150 - 440 K/uL 168 195 169   CXR: L>R basilar opacities - likely atelectasis  IMPRESSION: Chronic PAF - now rate is well controlled S/P fall and L sided chest discomfort Atelectasis due to splinting OSA  PLAN/REC: Discussed with Dr Ubaldo Glassing - transition to PO meds PCA morphine ordered Incentive spirometry Transfer out of ICU to telemetry  After transfer, PCCM will sign off. Please call if we can be of further assistance  Merton Border, MD PCCM service Mobile [phone] Pager [phone] 08/11/2016 8:32 AM

## 2016-08-11 NOTE — Progress Notes (Signed)
Pt CPAP removed. Pt oxygen saturation level fell to 85%, pt placed on 2L [place]. Current sats 92. Will continue to assess.

## 2016-08-11 NOTE — Plan of Care (Signed)
Problem: Education: Goal: Knowledge of [facility] Education information/materials will improve Outcome: Progressing Pt and family oriented to the unit. Provided with welcome booklet.   Problem: Safety: Goal: Ability to remain free from injury will improve Outcome: Progressing Pt has remained free from my shift. Family supervision ,call light in reach, near nurses station, bed in lowest position, bed alarm on, frequent verbal contacts.   Problem: Activity: Goal: Ability to tolerate increased activity will improve Outcome: Not Progressing Pt is refusing to turn in the bed, pt states" hurts to much".

## 2016-08-11 NOTE — Care Management Note (Signed)
Case Management Note  Patient Details  Name: Brendan Edwards MRN: [id number] Date of Birth: Jun 25, 1945  Subjective/Objective:                  RNCM spoke with patient's son regarding discharge planning. Patient has two son's by blood and a few step children. Son has questions about health care POA. I explained that patient would need to make that decision on his own and have capacity to do so. This son is worried about "him drinking and falling again and wants him to go to nursing facility". I explained medical necessity for that also. Patient is independent from home. PCP is Dr. Ginette Pitman. Action/Plan: RNCM offered Home health agency choice (to include a Education officer, museum) if patient agrees. RNCM to follow.   Expected Discharge Date:                  Expected Discharge Plan:     In-House Referral:     Discharge planning Services  CM Consult  Post Acute Care Choice:  Home Health Choice offered to:  Adult Children  DME Arranged:    DME Agency:     HH Arranged:  PT HH Agency:     Status of Service:  In process, will continue to follow  If discussed at Long Length of Stay Meetings, dates discussed:    Additional Comments:  Marshell Garfinkel, RN 08/11/2016, 4:01 PM

## 2016-08-11 NOTE — Clinical Social Work Note (Signed)
CSW received consult from patient's nurse regarding power of attorney. CSW called patient's nurse to clarify whether it was for Power of Architectural technologist. Patient's nurse stated it was for both. CSW advised patient's nurse that [facility] can facilitate a healthcare power of attorney and that we cannot facilitate a power of attorney. CSW explained that patient and family would have to pursue power of attorney through an attorney. CSW will speak with patient's son. Shela Leff MSW,LCSW [phone]

## 2016-08-12 DIAGNOSIS — F10231 Alcohol dependence with withdrawal delirium: Secondary | ICD-10-CM

## 2016-08-12 LAB — GLUCOSE, CAPILLARY
GLUCOSE-CAPILLARY: 146 mg/dL — AB (ref 65–99)
Glucose-Capillary: 137 mg/dL — ABNORMAL HIGH (ref 65–99)
Glucose-Capillary: 156 mg/dL — ABNORMAL HIGH (ref 65–99)
Glucose-Capillary: 94 mg/dL (ref 65–99)

## 2016-08-12 MED ORDER — DEXMEDETOMIDINE HCL IN NACL 400 MCG/100ML IV SOLN
0.4000 ug/kg/h | INTRAVENOUS | Status: DC
  Administered 2016-08-12: 0.5 ug/kg/h via INTRAVENOUS
  Administered 2016-08-12: 0.4 ug/kg/h via INTRAVENOUS
  Administered 2016-08-12: 0.5 ug/kg/h via INTRAVENOUS
  Filled 2016-08-12 (×3): qty 100

## 2016-08-12 MED ORDER — FUROSEMIDE 40 MG PO TABS
40.0000 mg | ORAL_TABLET | Freq: Every day | ORAL | Status: DC
  Administered 2016-08-12 – 2016-08-20 (×5): 40 mg via ORAL
  Filled 2016-08-12 (×9): qty 1

## 2016-08-12 MED ORDER — LORAZEPAM 2 MG/ML IJ SOLN
2.0000 mg | INTRAMUSCULAR | Status: DC | PRN
  Administered 2016-08-12: 3 mg via INTRAVENOUS
  Administered 2016-08-12: 2 mg via INTRAVENOUS
  Filled 2016-08-12: qty 2
  Filled 2016-08-12: qty 1

## 2016-08-12 MED ORDER — IBUPROFEN 400 MG PO TABS
600.0000 mg | ORAL_TABLET | Freq: Four times a day (QID) | ORAL | Status: DC | PRN
  Administered 2016-08-12 – 2016-08-14 (×4): 600 mg via ORAL
  Filled 2016-08-12 (×4): qty 2

## 2016-08-12 NOTE — Progress Notes (Signed)
Pt agitated, trying to crawl out of bed at beginning of shift. HR began to increase and remain elevated. 5 mg metoprolol given X2 , with no effectiveness. Pt still not understanding how to use PCA pump and having pain. PCA pump D/C and 1mg  morphine ordered. Ativan given using CIWA still with no relief. Pt HR and BP remaining elevated, pt increasing agitation. Precedex gtt ordered and started. Pt became more calm ,and now sleeping. HR and BP now WDL. Pt also started on BiPaP with improving O2 sats. Will continue to monitor.

## 2016-08-12 NOTE — Progress Notes (Signed)
Pt placed on [facility] C-1 CPAP. CPAP plugged into red outlet. 3L O2 in line.  

## 2016-08-12 NOTE — Plan of Care (Signed)
Problem: Safety: Goal: Ability to remain free from injury will improve Outcome: Progressing Pt has remained free from injury on my shift. Patient demonstrating abliity to use the call light.   Problem: Pain Managment: Goal: General experience of comfort will improve Outcome: Progressing Pt has verbalized pain level at 4/10 and refusing interventions. Pt states that pain is improved from yesterday.   Problem: Education: Goal: Knowledge of disease or condition will improve Outcome: Not Progressing Pt is in denial about drinking.

## 2016-08-12 NOTE — Care Management Note (Signed)
Case Management Note  Patient Details  Name: Brendan Edwards MRN: [id number] Date of Birth: [birth year]  Subjective/Objective:                  Met with patient to discuss discharge planning (see yesterday's CM note of discussion with patient's son). He had to be reoriented to where he was at and then wanted "to be transferred to [place]". He states he is from home alone and that he is independent. He states his PCP is Dr. Ginette Pitman. He states that he has not problems paying or obtaining medications. He denies DME available at home including O2.  Action/Plan: List of home health agencies provided. Patient aware home health is his choice as long as he has medical necessity and agrees. Son is also aware of this per our conversation yesterday.   Expected Discharge Date:                  Expected Discharge Plan:     In-House Referral:     Discharge planning Services  CM Consult  Post Acute Care Choice:  Home Health Choice offered to:  Adult Children, Patient  DME Arranged:    DME Agency:     HH Arranged:  PT HH Agency:     Status of Service:  In process, will continue to follow  If discussed at Long Length of Stay Meetings, dates discussed:    Additional Comments:  Marshell Garfinkel, RN 08/12/2016, 11:51 AM

## 2016-08-12 NOTE — Progress Notes (Signed)
Pt son in room requesting update. Will be back tonight.

## 2016-08-12 NOTE — Progress Notes (Signed)
[place] [facility] [place] NAME: Brendan Edwards    MR#:  [id number]  DATE OF BIRTH:  1945-05-04  SUBJECTIVE:  CHIEF COMPLAINT:   Chief Complaint  Patient presents with  . Fall     Came after a fall, found to have rib fracture and also A fib with RVR.    Rate is controlled with cardizem drip, now oral cardizem and metoprolol.    Have alcohol withdrawal since yesterday, started on precedex drip.  REVIEW OF SYSTEMS:  Pt is sedated with precedex, not able to give ROS.  ROS  DRUG ALLERGIES:   Allergies  Allergen Reactions  . Oxycodone Other (See Comments)    Hallucinations  . Cardura [Doxazosin Mesylate]   . Darvon [Propoxyphene]   . Penicillins   . Requip [Ropinirole Hcl]   . Septra [Sulfamethoxazole-Trimethoprim]     VITALS:  Blood pressure 133/90, pulse 98, temperature 98.7 F (37.1 C), temperature source Oral, resp. rate (!) 24, height 5\' 8"  (1.727 m), weight 98.4 kg (216 lb 14.9 oz), SpO2 94 %.  PHYSICAL EXAMINATION:  GENERAL:  71 y.o.-year-old patient lying in the bed with no acute distress.  EYES: Pupils equal, round, reactive to light . No scleral icterus. Extraocular muscles intact.  HEENT: Head atraumatic, normocephalic. Oropharynx and nasopharynx clear.  NECK:  Supple, no jugular venous distention. No thyroid enlargement, no tenderness.  LUNGS: Normal breath sounds bilaterally, no wheezing, rales,rhonchi or crepitation. No use of accessory muscles of respiration. Tender right side back. CARDIOVASCULAR: S1, S2 fast. No murmurs, rubs, or gallops.  ABDOMEN: Soft, nontender, nondistended. Bowel sounds present. No organomegaly or mass.  EXTREMITIES: No pedal edema, cyanosis, or clubbing.  NEUROLOGIC: sedated with precedex drip. PSYCHIATRIC: The patient is sedated.  SKIN: No obvious rash, lesion, or ulcer.   Physical Exam LABORATORY PANEL:   CBC  Recent Labs Lab 08/11/16 0216  WBC 14.9*  HGB 14.8  HCT 43.7  PLT 168    ------------------------------------------------------------------------------------------------------------------  Chemistries   Recent Labs Lab 08/10/16 1359 08/11/16 0216  NA 139 136  K 4.1 3.5  CL 101 97*  CO2 22 29  GLUCOSE 160* 135*  BUN 11 9  CREATININE 0.70 0.53*  CALCIUM 8.8* 8.4*  MG 1.1*  --   AST 50*  --   ALT 41  --   ALKPHOS 91  --   BILITOT 1.0  --    ------------------------------------------------------------------------------------------------------------------  Cardiac Enzymes  Recent Labs Lab 08/11/16 0216 08/11/16 0745  TROPONINI <0.03 <0.03   ------------------------------------------------------------------------------------------------------------------  RADIOLOGY:  Dg Chest Port 1 View  Result Date: 08/11/2016 CLINICAL DATA:  Recent fall. EXAM: PORTABLE CHEST 1 VIEW COMPARISON:  CT 08/10/2016. FINDINGS: Stable mild cardiomegaly. Low lung volumes with basilar atelectasis and mild infiltrates. Small left pleural effusion. No pneumothorax. Right posterolateral fifth rib fracture. Right fourth rib fracture best identified by CT. Thoracic spine compression fractures best identified by CT. IMPRESSION: 1. Low lung volumes with basilar atelectasis and basilar infiltrates noted on today's exam. Small left pleural effusion noted on today's exam. 2. Right posterolateral fifth rib fracture again noted. Right fourth rib fracture and thoracic vertebral body compression fractures best identified by prior CT . No pneumothorax. Electronically Signed   By: Marcello Moores  Register   On: 08/11/2016 06:54    ASSESSMENT AND PLAN:   Active Problems:   Atrial fibrillation with RVR (HCC)   Diabetes ([place])   Leukocytosis   Fall   Alcoholic hepatitis   Alcohol withdrawal ([place])  Tobacco abuse counseling  # Alcohol withdrawal,  pt is having active DT's, started on precedex drip.  initiate CIWA scale,  Low magnesium level- replaced , initiate thiamine, high-dose, folic  acid.  # A. fib, RVR,   Initially given Cardizem IV drip, now rate is controlled. Now on oral Cardizem CD and metoprolol,  continue Eliquis, appreciated cardiologist involved for recommendations   Likely RVR was due to pain. # Diabetes mellitus type 2,    Hb A1c- 6.3, continue outpatient medications, sliding scale insulin while in the hospital # Leukocytosis, likely stress related, follow with therapy, no antibody therapy, no obvious infection at present # Alcoholic hepatitis,    Normal LFT, ammonia and platelets. # Tobacco abuse. Counseling, discussed this patient for 4 minutes, refused nicotine replacement therapy # pain with rib fractures  now sedated.     All the records are reviewed and case discussed with Care Management/Social Workerr. Management plans discussed with the patient, family and they are in agreement.  CODE STATUS: full.  TOTAL TIME TAKING CARE OF THIS PATIENT: 35 minutes.   POSSIBLE D/C IN 2-3 DAYS, DEPENDING ON CLINICAL CONDITION.   Vaughan Basta M.D on 08/12/2016   Between 7am to 6pm - Pager - [phone]  After 6pm go to [email or web address] - password [email or web address] [facility]  Office  [phone]  CC: Primary care physician; Tracie Harrier, MD  Note: This dictation was prepared with Dragon dictation along with smaller phrase technology. Any transcriptional errors that result from this process are unintentional.

## 2016-08-12 NOTE — Progress Notes (Signed)
Pt is not able to maintain a good O2 saturation. Pt placed on Bipap for sleep

## 2016-08-12 NOTE — Progress Notes (Signed)
PT Cancellation Note  Patient Details Name: STEFFEN HASE MRN: [id number] DOB: 10/08/1945   Cancelled Treatment:    Reason Eval/Treat Not Completed: Other (comment). Per nursing, pt is agitated and demonstrating signs of aggressive behavior and is not appropriate for PT at this time. Will attempt PT eval at a later date/time as able.   Kodee Ravert, SPT 08/12/2016, 9:13 AM

## 2016-08-12 NOTE — Progress Notes (Signed)
L sided CP resolved. Problems yest evening and overnight with severe agitation/alcohol withdrawal. No respiratory distress. Poorly oriented. Occasionally combative  Vitals:   08/12/16 0700 08/12/16 0800 08/12/16 0900 08/12/16 1000  BP: 115/79 114/82 112/77 97/82  Pulse: (!) 102 78 93 81  Resp: 17 16 16 16   Temp:      TempSrc:      SpO2: 98% 96% 96% 97%  Weight:      Height:       NAD HEENT WNL No wheezes, clear anteriorly IRIR, no M Obese, soft, NT, +BS No edema No neuro deficits  BMP Latest Ref Rng & Units 08/11/2016 08/10/2016 02/09/2012  Glucose 65 - 99 mg/dL 135(H) 160(H) 144(H)  BUN 6 - 20 mg/dL 9 11 9   Creatinine 0.61 - 1.24 mg/dL 0.53(L) 0.70 0.83  Sodium 135 - 145 mmol/L 136 139 141  Potassium 3.5 - 5.1 mmol/L 3.5 4.1 3.4(L)  Chloride 101 - 111 mmol/L 97(L) 101 103  CO2 22 - 32 mmol/L 29 22 28   Calcium 8.9 - 10.3 mg/dL 8.4(L) 8.8(L) 8.8   CBC Latest Ref Rng & Units 08/11/2016 08/10/2016 02/09/2012  WBC 3.8 - 10.6 K/uL 14.9(H) 16.9(H) 9.0  Hemoglobin 13.0 - 18.0 g/dL 14.8 15.7 16.8  Hematocrit 40.0 - 52.0 % 43.7 46.8 49.4  Platelets 150 - 440 K/uL 168 195 169   CXR: NNF  IMPRESSION: Chronic PAF - rate is well controlled S/P fall and L sided chest discomfort. Clinically improved OSA Severe agitated delirium due to alcohol withdrawal  PLAN/REC: Cont current cardiac regimen Cont precedex - initiated 05/01 Cont CIWA protocol Cont incentive spirometry Cancel transfer to telemetry - he needs to remain in ICU/SDU as long as he is on dex   Merton Border, MD PCCM service Mobile [phone] Pager [phone] 08/12/2016 11:23 AM

## 2016-08-12 NOTE — Clinical Social Work Note (Signed)
Clinical Social Work Assessment  Patient Details  Name: Brendan Edwards MRN: [id number] Date of Birth: [birth year]  Date of referral:  08/12/16               Reason for consult:  [place], Family Concerns, Discharge Planning                Permission sought to share information with:  Family Supports Permission granted to share information::  Yes, Verbal Permission Granted  Name::        Agency::     Relationship::     Contact Information:     Housing/Transportation Living arrangements for the past 2 months:  Single Family Home Source of Information:  Patient Patient Interpreter Needed:  None Criminal Activity/Legal Involvement Pertinent to Current Situation/Hospitalization:  No - Comment as needed Significant Relationships:  Significant Other, Adult Children Lives with:  Self Do you feel safe going back to the place where you live?  Yes Need for family participation in patient care:  No (Coment)  Care giving concerns:  Patient reports that he lives alone.   Social Worker assessment / plan:  CSW came to see patient this afternoon and spoke with patient's nurse prior. Patient's nurse stated patient's cognition has improved and he is doing better. CSW met with patient and explained role and purpose of visit. Patient new where he was, the year, the president. He thought the month was April but first day of May was yesterday and it was April when he was admitted to the hospital. Patient informed CSW that he lives alone but that he does have a girlfriend. Patient reports that he is independent with ADL's at baseline and that he still drives. Patient states he is able to get his necessities at home and has no concerns.   In reference to patient's drinking of alcohol, patient denies that he has a drinking problem. Patient states that both of his sons think he has a problem but he states that he only drinks on wednesdays and saturdays when he gets together to play poker with  his friends. Patient states that he believes his confusion upon admission and beginning of hospitalization had to do with pain medication that he was given. Patient reports that his relationship with his sons is general a good one but that they differ on their opinions of his drinking and his girlfriend.   CSW discussed with patient potential plans post discharge. Patient states that he would be open to home health but would not be open to going to a facility for STR. Patient's PT consult is pending. In reference to advanced directives, patient stated that he would allow his sons to become healthcare power of attorney and make healthcare decisions for him if he was unable to do so.   CSW will continue to follow and assist as needed. The RN CM spoke with a son earlier today and explained and answered questions regarding advanced directives.   Employment status:    Insurance informationEducational psychologist PT Recommendations:  Not assessed at this time Information / Referral to community resources:     Patient/Family's Response to care:  Patient expressed appreciation for CSW visit.  Patient/Family's Understanding of and Emotional Response to Diagnosis, Current Treatment, and Prognosis:  Patient minimizes his alcohol intake at this time and does not believe that it contributes to any of his current issues.   Emotional Assessment Appearance:  Appears stated age Attitude/Demeanor/Rapport:   (cooperative and cordial) Affect (typically  observed):  Frustrated, Calm, Pleasant, Appropriate Orientation:  Oriented to Self, Oriented to Place, Oriented to  Time, Oriented to Situation Alcohol / Substance use:  Alcohol Use Psych involvement (Current and /or in the community):  No (Comment)  Discharge Needs  Concerns to be addressed:  Care Coordination Readmission within the last 30 days:  No Current discharge risk:  None Barriers to Discharge:  No Barriers Identified   Shela Leff, [place] 08/12/2016,  4:48 PM

## 2016-08-12 NOTE — Progress Notes (Signed)
Pt informed this nurse that Oxycodone has "always caused hallucinations" Dr.Simmonds notified. Oxy condone discontinued. Pt not alert and oriented. Medication added to allergy list. Will continue to assess.

## 2016-08-13 LAB — BASIC METABOLIC PANEL
Anion gap: 9 (ref 5–15)
BUN: 17 mg/dL (ref 6–20)
CHLORIDE: 95 mmol/L — AB (ref 101–111)
CO2: 31 mmol/L (ref 22–32)
CREATININE: 0.81 mg/dL (ref 0.61–1.24)
Calcium: 9.4 mg/dL (ref 8.9–10.3)
GFR calc Af Amer: >60 mL/min (ref >60)
GFR calc non Af Amer: >60 mL/min (ref >60)
Glucose, Bld: 116 mg/dL — ABNORMAL HIGH (ref 65–99)
Potassium: 4.6 mmol/L (ref 3.5–5.1)
Sodium: 135 mmol/L (ref 135–145)

## 2016-08-13 LAB — GLUCOSE, CAPILLARY
GLUCOSE-CAPILLARY: 122 mg/dL — AB (ref 65–99)
GLUCOSE-CAPILLARY: 111 mg/dL — AB (ref 65–99)
Glucose-Capillary: 111 mg/dL — ABNORMAL HIGH (ref 65–99)

## 2016-08-13 LAB — CBC
HCT: 47.8 % (ref 40.0–52.0)
Hemoglobin: 15.9 g/dL (ref 13.0–18.0)
MCH: 35.5 pg — AB (ref 26.0–34.0)
MCHC: 33.2 g/dL (ref 32.0–36.0)
MCV: 107 fL — AB (ref 80.0–100.0)
PLATELETS: 186 10*3/uL (ref 150–440)
RBC: 4.47 MIL/uL (ref 4.40–5.90)
RDW: 14.5 % (ref 11.5–14.5)
WBC: 14.2 10*3/uL — ABNORMAL HIGH (ref 3.8–10.6)

## 2016-08-13 MED ORDER — OXYCODONE HCL 5 MG PO TABS: 5.0000 mg | ORAL_TABLET | ORAL | Status: DC | PRN

## 2016-08-13 MED ORDER — LORAZEPAM 1 MG PO TABS
2.0000 mg | ORAL_TABLET | ORAL | Status: DC | PRN
  Administered 2016-08-14 – 2016-08-20 (×7): 2 mg via ORAL
  Filled 2016-08-13 (×7): qty 2

## 2016-08-13 MED ORDER — DILTIAZEM HCL ER COATED BEADS 180 MG PO CP24
360.0000 mg | ORAL_CAPSULE | Freq: Every day | ORAL | Status: DC
  Administered 2016-08-13 – 2016-08-20 (×8): 360 mg via ORAL
  Filled 2016-08-13 (×8): qty 2

## 2016-08-13 MED ORDER — HYDRALAZINE HCL 20 MG/ML IJ SOLN
10.0000 mg | Freq: Four times a day (QID) | INTRAMUSCULAR | Status: DC | PRN
  Administered 2016-08-20: 10 mg via INTRAVENOUS
  Filled 2016-08-13: qty 1

## 2016-08-13 NOTE — Evaluation (Signed)
Physical Therapy Evaluation Patient Details Name: Brendan Edwards MRN: [id number] DOB: 1945/05/27 Today's Date: 08/13/2016   History of Present Illness  Pt is a 71 y.o. male presenting to hospital s/p fall and presenting with L sided rib pain and jaw pain with swelling.  Pt found to have old R 4-5th rib fx's and T3 and T12 compression deformities of uncertain chronicity.  Pt admitted to step-down unit with a-fib with RVR.  PMH includes a-fib on eliquis, BPH, htn, sleep apnea, cardiomyopathy, B jt replacements, chronic systolic CHF, h/o ETOH abuse.  Clinical Impression  Prior to hospital admission, pt was ambulating with SPC.  Pt lives alone in 1 level home with multiple steps to enter.  Currently pt is SBA with bed mobility, min assist to stand with RW, and CGA to march in place with RW.  Deferred ambulation d/t each time standing pt c/o dizziness (BP 161/96); pt's HR also fluctuating between 117-141 bpm with standing/marching in place activities.  Pt would benefit from skilled PT to address noted impairments and functional limitations (see below for any additional details).  Upon hospital discharge, recommend pt discharge to home with HHPT.    Follow Up Recommendations Home health PT    Equipment Recommendations  Rolling walker with 5" wheels    Recommendations for Other Services       Precautions / Restrictions Precautions Precautions: Fall Restrictions Weight Bearing Restrictions: No      Mobility  Bed Mobility Overal bed mobility: Needs Assistance Bed Mobility: Supine to Sit;Sit to Supine     Supine to sit: Supervision;HOB elevated Sit to supine: Supervision;HOB elevated   General bed mobility comments: increased effort to perform; 2 assist to boost pt up in bed end of session per pt request for positioning  Transfers Overall transfer level: Needs assistance Equipment used: Rolling walker (2 wheeled) Transfers: Sit to/from Stand Sit to Stand: Min assist         General  transfer comment: x3 trials; assist to initiate stand; vc's for hand placement on walker  Ambulation/Gait Ambulation/Gait assistance: Min guard Ambulation Distance (Feet):  (marching in place x3 trials (x10 reps B LE's each trial)) Assistive device: Rolling walker (2 wheeled)       General Gait Details: steady with use of RW; limited ambulation d/t HR elevated with activity and c/o dizziness in standing  Stairs            Wheelchair Mobility    Modified Rankin (Stroke Patients Only)       Balance Overall balance assessment: Needs assistance;History of Falls Sitting-balance support: Bilateral upper extremity supported;Feet supported Sitting balance-Leahy Scale: Good Sitting balance - Comments: reaching within BOS   Standing balance support: Bilateral upper extremity supported (on RW) Standing balance-Leahy Scale: Good Standing balance comment: marching in place                             Pertinent Vitals/Pain Pain Assessment: 0-10 Pain Score: 2  (2/10 L ribs at rest; 8/10 L ribs with movement/coughing) Pain Location: L ribs Pain Descriptors / Indicators: Sore;Tender Pain Intervention(s): Limited activity within patient's tolerance;Monitored during session;Premedicated before session;Repositioned    Home Living Family/patient expects to be discharged to:: Private residence Living Arrangements: Alone   Type of Home: House Home Access: Stairs to enter   [employer] of Steps: 10 steps with R rail from garage (uses this entrance) or 4 with B railing from front of home (never uses this  entrance) Home Layout: One level Home Equipment: [place] - 2 wheels;Cane - single point;Shower seat - built in Additional Comments: No grab bars in bathroom    Prior Function Level of Independence: Independent with assistive device(s)         Comments: Ambulates with SPC     Hand Dominance        Extremity/Trunk Assessment   Upper Extremity  Assessment Upper Extremity Assessment: Overall WFL for tasks assessed    Lower Extremity Assessment Lower Extremity Assessment: Overall WFL for tasks assessed    Cervical / Trunk Assessment Cervical / Trunk Assessment: Normal  Communication   Communication: No difficulties  Cognition Arousal/Alertness: Awake/alert Behavior During Therapy: WFL for tasks assessed/performed Overall Cognitive Status: Within Functional Limits for tasks assessed                                        General Comments General comments (skin integrity, edema, etc.): Bruising and abrasions noted.  Nursing cleared pt for participation in physical therapy.  Pt agreeable to PT session.    Exercises  Transfer and standing activity training.   Assessment/Plan    PT Assessment Patient needs continued PT services  PT Problem List Decreased activity tolerance;Decreased mobility;Pain;Decreased balance       PT Treatment Interventions DME instruction;Gait training;Stair training;Functional mobility training;Therapeutic activities;Therapeutic exercise;Balance training;Patient/family education    PT Goals (Current goals can be found in the Care Plan section)  Acute Rehab PT Goals Patient Stated Goal: to have less L rib pain PT Goal Formulation: With patient Time For Goal Achievement: 08/27/16 Potential to Achieve Goals: Fair    Frequency Min 2X/week   Barriers to discharge        Co-evaluation               AM-PAC PT "6 Clicks" Daily Activity  Outcome Measure Difficulty turning over in bed (including adjusting bedclothes, sheets and blankets)?: A Little Difficulty moving from lying on back to sitting on the side of the bed? : A Little Difficulty sitting down on and standing up from a chair with arms (e.g., wheelchair, bedside commode, etc,.)?: Total Help needed moving to and from a bed to chair (including a wheelchair)?: A Little Help needed walking in hospital room?: A  Little Help needed climbing 3-5 steps with a railing? : A Little 6 Click Score: 16    End of Session Equipment Utilized During Treatment: Gait belt;Oxygen (3 L O2 via nasal cannula) Activity Tolerance: Patient tolerated treatment well Patient left: in bed;with call bell/phone within reach;with bed alarm set Nurse Communication: Mobility status;Precautions (Pt's HR and c/o dizziness during session) PT Visit Diagnosis: History of falling (Z91.81);Difficulty in walking, not elsewhere classified (R26.2)    Time: 1040-1110 PT Time Calculation (min) (ACUTE ONLY): 30 min   Charges:   PT Evaluation $PT Eval Low Complexity: 1 Procedure PT Treatments $Therapeutic Activity: 8-22 mins   PT G CodesLeitha Bleak, PT 08/13/16, 12:14 PM [phone]

## 2016-08-13 NOTE — Progress Notes (Signed)
[place] at [place] NAME: Brendan Edwards    MR#:  [id number]  DATE OF BIRTH:  [birth year]  SUBJECTIVE:  CHIEF COMPLAINT:   Chief Complaint  Patient presents with  . Fall     Came after a fall, found to have rib fracture and also A fib with RVR.    Rate is controlled with cardizem drip, now oral cardizem and metoprolol.    Had alcohol withdrawal , on precedex drip.   Now came off the drip and alert and oriented.   He denies regular use of alcohol, but his daughter came out of room, and told- he drinks everyday.  REVIEW OF SYSTEMS:   Review of Systems  Constitutional: Negative for chills, fever and malaise/fatigue.  HENT: Negative for congestion and hearing loss.   Eyes: Negative for blurred vision and discharge.  Respiratory: Negative for cough, sputum production and shortness of breath.   Cardiovascular: Positive for chest pain. Negative for palpitations and leg swelling.  Gastrointestinal: Negative for abdominal pain, diarrhea, nausea and vomiting.  Genitourinary: Negative for dysuria and frequency.  Musculoskeletal: Negative for myalgias.  Skin: Negative for rash.  Neurological: Negative for dizziness, tremors and sensory change.  Psychiatric/Behavioral: Negative for depression and suicidal ideas.    DRUG ALLERGIES:   Allergies  Allergen Reactions  . Oxycodone Other (See Comments)    Hallucinations  . Cardura [Doxazosin Mesylate]   . Darvon [Propoxyphene]   . Penicillins   . Requip [Ropinirole Hcl]   . Septra [Sulfamethoxazole-Trimethoprim]     VITALS:  Blood pressure (!) 161/96, pulse (!) 127, temperature 98.5 F (36.9 C), temperature source Oral, resp. rate (!) 22, height 5\' 8"  (1.727 m), weight 95.2 kg (209 lb 14.1 oz), SpO2 95 %.  PHYSICAL EXAMINATION:  GENERAL:  71 y.o.-year-old patient lying in the bed with no acute distress.  EYES: Pupils equal, round, reactive to light . No scleral icterus. Extraocular muscles  intact.  HEENT: Head atraumatic, normocephalic. Oropharynx and nasopharynx clear.  NECK:  Supple, no jugular venous distention. No thyroid enlargement, no tenderness.  LUNGS: Normal breath sounds bilaterally, no wheezing, rales,rhonchi or crepitation. No use of accessory muscles of respiration. Tender right side back. CARDIOVASCULAR: S1, S2 fast. No murmurs, rubs, or gallops.  ABDOMEN: Soft, nontender, nondistended. Bowel sounds present. No organomegaly or mass.  EXTREMITIES: No pedal edema, cyanosis, or clubbing.  NEUROLOGIC: cranial nerves- intact, power /5 all limbs follows commands, sensation intact, gait not checked. PSYCHIATRIC: The patient is alert and oriented  SKIN: No obvious rash, lesion, or ulcer.   Physical Exam LABORATORY PANEL:   CBC  Recent Labs Lab 08/13/16 0418  WBC 14.2*  HGB 15.9  HCT 47.8  PLT 186   ------------------------------------------------------------------------------------------------------------------  Chemistries   Recent Labs Lab 08/10/16 1359  08/13/16 0418  NA 139  < > 135  K 4.1  < > 4.6  CL 101  < > 95*  CO2 22  < > 31  GLUCOSE 160*  < > 116*  BUN 11  < > 17  CREATININE 0.70  < > 0.81  CALCIUM 8.8*  < > 9.4  MG 1.1*  --   --   AST 50*  --   --   ALT 41  --   --   ALKPHOS 91  --   --   BILITOT 1.0  --   --   < > = values in this interval not displayed. ------------------------------------------------------------------------------------------------------------------  Cardiac Enzymes  Recent  Labs Lab 08/11/16 0216 08/11/16 0745  TROPONINI <0.03 <0.03   ------------------------------------------------------------------------------------------------------------------  RADIOLOGY:  No results found.  ASSESSMENT AND PLAN:   Active Problems:   Atrial fibrillation with RVR (HCC)   Diabetes (HCC)   Leukocytosis   Fall   Alcoholic hepatitis   Alcohol withdrawal ([place])   Tobacco abuse counseling  # Alcohol withdrawal,   pt was having active DT's, started on precedex drip.  initiate CIWA scale,  Low magnesium level- replaced , initiate thiamine, high-dose, folic acid.    08/13/16- came off the precedex drip, move to medical floor and watch for DT. # A. fib, RVR,   Initially given Cardizem IV drip, now rate is controlled. Now on oral Cardizem CD and metoprolol,  continue Eliquis, appreciated cardiologist involved for recommendations   Likely RVR was due to pain.   Monitor on tele. # Diabetes mellitus type 2,    Hb A1c- 6.3, continue outpatient medications, sliding scale insulin while in the hospital # Leukocytosis, likely stress related, follow with therapy, no antibody therapy, no obvious infection at present # Alcoholic hepatitis,    Normal LFT, ammonia and platelets. # Tobacco abuse. Counseling, discussed this patient for 4 minutes, refused nicotine replacement therapy # pain with rib fractures  will give oral oxycodone.   Get PT eval.     All the records are reviewed and case discussed with Care Management/Social Workerr. Management plans discussed with the patient, family and they are in agreement.  CODE STATUS: full.  TOTAL TIME TAKING CARE OF THIS PATIENT: 35 minutes.   POSSIBLE D/C IN 2-3 DAYS, DEPENDING ON CLINICAL CONDITION.   Vaughan Basta M.D on 08/13/2016   Between 7am to 6pm - Pager - [phone]  After 6pm go to [email or web address] - password [email or web address]  Office  [phone]  CC: Primary care physician; Tracie Harrier, MD  Note: This dictation was prepared with Dragon dictation along with smaller phrase technology. Any transcriptional errors that result from this process are unintentional.

## 2016-08-13 NOTE — Progress Notes (Signed)
CBG 137, patient declines insulin. Educated.

## 2016-08-13 NOTE — Progress Notes (Signed)
Pt transported to  Room 252 in stable condition by this RN. Lovena Le, RN at bedside. All belongings with pt, daughter at bedside and aware of transfer.

## 2016-08-14 LAB — GLUCOSE, CAPILLARY
GLUCOSE-CAPILLARY: 139 mg/dL — AB (ref 65–99)
Glucose-Capillary: 137 mg/dL — ABNORMAL HIGH (ref 65–99)
Glucose-Capillary: 105 mg/dL — ABNORMAL HIGH (ref 65–99)
Glucose-Capillary: 180 mg/dL — ABNORMAL HIGH (ref 65–99)
Glucose-Capillary: 81 mg/dL (ref 65–99)

## 2016-08-14 MED ORDER — ACETAMINOPHEN 325 MG PO TABS
650.0000 mg | ORAL_TABLET | Freq: Four times a day (QID) | ORAL | Status: DC | PRN
  Administered 2016-08-18 – 2016-08-19 (×3): 650 mg via ORAL
  Filled 2016-08-14 (×3): qty 2

## 2016-08-14 MED ORDER — IBUPROFEN 400 MG PO TABS
400.0000 mg | ORAL_TABLET | Freq: Four times a day (QID) | ORAL | Status: DC | PRN
  Administered 2016-08-14 (×2): 400 mg via ORAL
  Filled 2016-08-14 (×2): qty 1

## 2016-08-14 MED ORDER — METOPROLOL TARTRATE 5 MG/5ML IV SOLN
5.0000 mg | Freq: Once | INTRAVENOUS | Status: AC
  Administered 2016-08-14: 5 mg via INTRAVENOUS
  Filled 2016-08-14: qty 5

## 2016-08-14 MED ORDER — LOPERAMIDE HCL 2 MG PO CAPS
2.0000 mg | ORAL_CAPSULE | Freq: Four times a day (QID) | ORAL | Status: DC | PRN
  Administered 2016-08-14 – 2016-08-16 (×3): 2 mg via ORAL
  Filled 2016-08-14 (×3): qty 1

## 2016-08-14 MED ORDER — KETOROLAC TROMETHAMINE 30 MG/ML IJ SOLN
30.0000 mg | Freq: Three times a day (TID) | INTRAMUSCULAR | Status: AC | PRN
  Administered 2016-08-14 – 2016-08-16 (×4): 30 mg via INTRAVENOUS
  Filled 2016-08-14 (×4): qty 1

## 2016-08-14 NOTE — Progress Notes (Signed)
[place] at [place] NAME: Eliah Marquard    MR#:  [id number]  DATE OF BIRTH:  01-Jun-1945  SUBJECTIVE:  CHIEF COMPLAINT:   Chief Complaint  Patient presents with  . Fall     Came after a fall, found to have rib fracture and also A fib with RVR.    Rate is controlled with cardizem drip, now oral cardizem and metoprolol.    Had alcohol withdrawal , on precedex drip.   Now came off the drip and alert and oriented.   He denies regular use of alcohol, but his daughter came out of room, and told- he drinks everyday.   Again today have A FIB with RVR. Denies using oxycodone as he is " allergic"  REVIEW OF SYSTEMS:   Review of Systems  Constitutional: Negative for chills, fever and malaise/fatigue.  HENT: Negative for congestion and hearing loss.   Eyes: Negative for blurred vision and discharge.  Respiratory: Negative for cough, sputum production and shortness of breath.   Cardiovascular: Positive for chest pain. Negative for palpitations and leg swelling.  Gastrointestinal: Negative for abdominal pain, diarrhea, nausea and vomiting.  Genitourinary: Negative for dysuria and frequency.  Musculoskeletal: Negative for myalgias.  Skin: Negative for rash.  Neurological: Negative for dizziness, tremors and sensory change.  Psychiatric/Behavioral: Negative for depression and suicidal ideas.    DRUG ALLERGIES:   Allergies  Allergen Reactions  . Oxycodone Other (See Comments)    Hallucinations  . Cardura [Doxazosin Mesylate]   . Darvon [Propoxyphene]   . Penicillins   . Requip [Ropinirole Hcl]   . Septra [Sulfamethoxazole-Trimethoprim]     VITALS:  Blood pressure 138/75, pulse (!) 127, temperature 98.3 F (36.8 C), temperature source Oral, resp. rate 18, height 5\' 8"  (1.727 m), weight 96.6 kg (213 lb), SpO2 92 %.  PHYSICAL EXAMINATION:  GENERAL:  71 y.o.-year-old patient lying in the bed with no acute distress.  EYES: Pupils equal, round,  reactive to light . No scleral icterus. Extraocular muscles intact.  HEENT: Head atraumatic, normocephalic. Oropharynx and nasopharynx clear.  NECK:  Supple, no jugular venous distention. No thyroid enlargement, no tenderness.  LUNGS: Normal breath sounds bilaterally, no wheezing, rales,rhonchi or crepitation. No use of accessory muscles of respiration. Tender right side back. CARDIOVASCULAR: S1, S2 fast, irregular. No murmurs, rubs, or gallops.  ABDOMEN: Soft, nontender, nondistended. Bowel sounds present. No organomegaly or mass.  EXTREMITIES: No pedal edema, cyanosis, or clubbing.  NEUROLOGIC: cranial nerves- intact, power /5 all limbs follows commands, sensation intact, gait not checked. PSYCHIATRIC: The patient is alert and oriented  SKIN: No obvious rash, lesion, or ulcer.   Physical Exam LABORATORY PANEL:   CBC  Recent Labs Lab 08/13/16 0418  WBC 14.2*  HGB 15.9  HCT 47.8  PLT 186   ------------------------------------------------------------------------------------------------------------------  Chemistries   Recent Labs Lab 08/10/16 1359  08/13/16 0418  NA 139  < > 135  K 4.1  < > 4.6  CL 101  < > 95*  CO2 22  < > 31  GLUCOSE 160*  < > 116*  BUN 11  < > 17  CREATININE 0.70  < > 0.81  CALCIUM 8.8*  < > 9.4  MG 1.1*  --   --   AST 50*  --   --   ALT 41  --   --   ALKPHOS 91  --   --   BILITOT 1.0  --   --   < > =  values in this interval not displayed. ------------------------------------------------------------------------------------------------------------------  Cardiac Enzymes  Recent Labs Lab 08/11/16 0216 08/11/16 0745  TROPONINI <0.03 <0.03   ------------------------------------------------------------------------------------------------------------------  RADIOLOGY:  No results found.  ASSESSMENT AND PLAN:   Active Problems:   Atrial fibrillation with RVR (HCC)   Diabetes (HCC)   Leukocytosis   Fall   Alcoholic hepatitis   Alcohol  withdrawal ([place])   Tobacco abuse counseling  # Alcohol withdrawal,  pt was having active DT's, started on precedex drip.  initiate CIWA scale,  Low magnesium level- replaced , initiate thiamine, high-dose, folic acid.    08/13/16- came off the precedex drip, move to medical floor and watch for DT. Stable now. # A. fib, RVR,   Initially given Cardizem IV drip,  Now on oral Cardizem CD and metoprolol,  continue Eliquis, appreciated cardiologist involved for recommendations   Likely RVR was due to pain.   Monitor on tele.    Again have RVR- given IV metoprolol. # Diabetes mellitus type 2,    Hb A1c- 6.3, continue outpatient medications, sliding scale insulin while in the hospital # Leukocytosis, likely stress related, follow with therapy, no antibody therapy, no obvious infection at present # Alcoholic hepatitis,    Normal LFT, ammonia and platelets. # Tobacco abuse. Counseling, discussed this patient for 4 minutes, refused nicotine replacement therapy # pain with rib fractures  pt refused oxycodone, asking for ibuprofen and tylenol.   Get PT eval.     All the records are reviewed and case discussed with Care Management/Social Workerr. Management plans discussed with the patient, family and they are in agreement.  CODE STATUS: full.  TOTAL TIME TAKING CARE OF THIS PATIENT: 35 critical care minutes.  Again have A fib with rvr today- giving IV rate controlling meds today.  POSSIBLE D/C IN 2-3 DAYS, DEPENDING ON CLINICAL CONDITION.   Vaughan Basta M.D on 08/14/2016   Between 7am to 6pm - Pager - [phone]  After 6pm go to [email or web address] - password [email or web address] [facility]  Office  [phone]  CC: Primary care physician; Tracie Harrier, MD  Note: This dictation was prepared with Dragon dictation along with smaller phrase technology. Any transcriptional errors that result from this process are unintentional.

## 2016-08-14 NOTE — Progress Notes (Signed)
Pt states he has no allergy to Requip and he takes it occasionally to help him sleep. Gave scheduled dose as ordered. Will continue to monitor.

## 2016-08-14 NOTE — Plan of Care (Signed)
Problem: Safety: Goal: Ability to remain free from injury will improve Outcome: Progressing Occasionally refuses bed alarm. Encouraged to continue using. Wenda Low [facility]

## 2016-08-14 NOTE — Progress Notes (Signed)
Pt placed on [facility] BIPAP. BIPAP plugged into red outlet with 3L O2 inline

## 2016-08-14 NOTE — Care Management (Signed)
Patient in the presence of his daughter is now agreeable to having home health nursing and physical therapy.  he then tells CM that when he leaves the hospital he has to go back to work.  He owns his own business. Discussed home bound criteria and weakness from his current illness.   He does agree that at present, he would not be able to return to work. He says he has a rolling walker at home.  Updated [facility].

## 2016-08-14 NOTE — Care Management Note (Signed)
Case Management Note  Patient Details  Name: Brendan Edwards MRN: [id number] Date of Birth: 1945-08-11  Subjective/Objective:                 Patient transferred from ICU to 2A. Patient is considering accepting home health services. No agency preference.  Heads up referral to [facility].     Action/Plan:   Expected Discharge Date:                  Expected Discharge Plan:     In-House Referral:     Discharge planning Services  CM Consult  Post Acute Care Choice:  Home Health Choice offered to:  Adult Children, Patient  DME Arranged:    DME Agency:     HH Arranged:  PT HH Agency:     Status of Service:  In process, will continue to follow  If discussed at Long Length of Stay Meetings, dates discussed:    Additional Comments:  Katrina Stack, RN 08/14/2016, 8:39 AM

## 2016-08-14 NOTE — Care Management Important Message (Signed)
Important Message  Patient Details  Name: Brendan Edwards MRN: [id number] Date of Birth: April 09, 1946   Medicare Important Message Given:  Yes  Signed IM notice given    Katrina Stack, RN 08/14/2016, 12:33 PM

## 2016-08-15 LAB — BASIC METABOLIC PANEL
Anion gap: 9 (ref 5–15)
BUN: 17 mg/dL (ref 6–20)
CALCIUM: 8.4 mg/dL — AB (ref 8.9–10.3)
CO2: 30 mmol/L (ref 22–32)
CREATININE: 0.84 mg/dL (ref 0.61–1.24)
Chloride: 92 mmol/L — ABNORMAL LOW (ref 101–111)
Glucose, Bld: 195 mg/dL — ABNORMAL HIGH (ref 65–99)
Potassium: 3.4 mmol/L — ABNORMAL LOW (ref 3.5–5.1)
Sodium: 131 mmol/L — ABNORMAL LOW (ref 135–145)

## 2016-08-15 LAB — URINALYSIS, COMPLETE (UACMP) WITH MICROSCOPIC
Bacteria, UA: NONE SEEN
Bilirubin Urine: NEGATIVE
Glucose, UA: NEGATIVE mg/dL
Hgb urine dipstick: NEGATIVE
KETONES UR: NEGATIVE mg/dL
Leukocytes, UA: NEGATIVE
Nitrite: NEGATIVE
PH: 6 (ref 5.0–8.0)
Protein, ur: NEGATIVE mg/dL
SPECIFIC GRAVITY, URINE: 1.01 (ref 1.005–1.030)
Squamous Epithelial / LPF: NONE SEEN

## 2016-08-15 LAB — CBC
HCT: 45.4 % (ref 40.0–52.0)
Hemoglobin: 15.7 g/dL (ref 13.0–18.0)
MCH: 35.8 pg — AB (ref 26.0–34.0)
MCHC: 34.5 g/dL (ref 32.0–36.0)
MCV: 103.7 fL — ABNORMAL HIGH (ref 80.0–100.0)
PLATELETS: 227 10*3/uL (ref 150–440)
RBC: 4.38 MIL/uL — ABNORMAL LOW (ref 4.40–5.90)
RDW: 14.3 % (ref 11.5–14.5)
WBC: 10.7 10*3/uL — ABNORMAL HIGH (ref 3.8–10.6)

## 2016-08-15 LAB — GLUCOSE, CAPILLARY
GLUCOSE-CAPILLARY: 107 mg/dL — AB (ref 65–99)
GLUCOSE-CAPILLARY: 103 mg/dL — AB (ref 65–99)
GLUCOSE-CAPILLARY: 174 mg/dL — AB (ref 65–99)
Glucose-Capillary: 165 mg/dL — ABNORMAL HIGH (ref 65–99)

## 2016-08-15 MED ORDER — IBUPROFEN 400 MG PO TABS
400.0000 mg | ORAL_TABLET | Freq: Three times a day (TID) | ORAL | Status: DC | PRN
  Administered 2016-08-15 – 2016-08-20 (×11): 400 mg via ORAL
  Filled 2016-08-15 (×11): qty 1

## 2016-08-15 MED ORDER — DIGOXIN 0.25 MG/ML IJ SOLN
0.2500 mg | Freq: Once | INTRAMUSCULAR | Status: DC
  Filled 2016-08-15: qty 1

## 2016-08-15 MED ORDER — DIGOXIN 0.25 MG/ML IJ SOLN
0.2500 mg | Freq: Once | INTRAMUSCULAR | Status: AC
  Administered 2016-08-15: 0.25 mg via INTRAVENOUS
  Filled 2016-08-15: qty 2

## 2016-08-15 MED ORDER — LIDOCAINE VISCOUS 2 % MT SOLN
15.0000 mL | Freq: Four times a day (QID) | OROMUCOSAL | Status: DC | PRN
  Administered 2016-08-15 – 2016-08-17 (×3): 15 mL via OROMUCOSAL
  Filled 2016-08-15 (×5): qty 15

## 2016-08-15 MED ORDER — NYSTATIN 100000 UNIT/ML MT SUSP
5.0000 mL | Freq: Four times a day (QID) | OROMUCOSAL | Status: DC
  Administered 2016-08-15 – 2016-08-20 (×21): 500000 [IU] via ORAL
  Filled 2016-08-15 (×21): qty 5

## 2016-08-15 NOTE — Progress Notes (Signed)
       [facility]  SUBJECTIVE: No chest pain or shortness of breath.   Vitals:   08/14/16 1433 08/14/16 1938 08/15/16 0446 08/15/16 0911  BP: 112/73 (!) 151/92 (!) 143/103 (!) 142/88  Pulse: 90 92 (!) 136 (!) 154  Resp: 18 16 18    Temp: 98.3 F (36.8 C) 98.8 F (37.1 C) 97.8 F (36.6 C) 97.9 F (36.6 C)  TempSrc:  Oral Oral Oral  SpO2: 94% 95% 95% 97%  Weight:   95.8 kg (211 lb 3.2 oz)   Height:        Intake/Output Summary (Last 24 hours) at 08/15/16 0952 Last data filed at 08/15/16 0943  Gross per 24 hour  Intake              240 ml  Output              800 ml  Net             -560 ml    LABS: Basic Metabolic Panel:  Recent Labs  08/13/16 0418  NA 135  K 4.6  CL 95*  CO2 31  GLUCOSE 116*  BUN 17  CREATININE 0.81  CALCIUM 9.4   Liver Function Tests: No results for input(s): AST, ALT, ALKPHOS, BILITOT, PROT, ALBUMIN in the last 72 hours. No results for input(s): LIPASE, AMYLASE in the last 72 hours. CBC:  Recent Labs  08/13/16 0418  WBC 14.2*  HGB 15.9  HCT 47.8  MCV 107.0*  PLT 186   Cardiac Enzymes: No results for input(s): CKTOTAL, CKMB, CKMBINDEX, TROPONINI in the last 72 hours. BNP: Invalid input(s): POCBNP D-Dimer: No results for input(s): DDIMER in the last 72 hours. Hemoglobin A1C: No results for input(s): HGBA1C in the last 72 hours. Fasting Lipid Panel: No results for input(s): CHOL, HDL, LDLCALC, TRIG, CHOLHDL, LDLDIRECT in the last 72 hours. Thyroid Function Tests: No results for input(s): TSH, T4TOTAL, T3FREE, THYROIDAB in the last 72 hours.  Invalid input(s): FREET3 Anemia Panel: No results for input(s): VITAMINB12, FOLATE, FERRITIN, TIBC, IRON, RETICCTPCT in the last 72 hours.   Physical Exam: Blood pressure (!) 142/88, pulse (!) 154, temperature 97.9 F (36.6 C), temperature source Oral, resp. rate 18, height 5\' 8"  (1.727 m), weight 95.8 kg (211 lb 3.2 oz), SpO2 97 %.   Wt  Readings from Last 1 Encounters:  08/15/16 95.8 kg (211 lb 3.2 oz)     General appearance: alert and cooperative Resp: clear to auscultation bilaterally Cardio: irregularly irregular rhythm GI: soft, non-tender; bowel sounds normal; no masses,  no organomegaly Extremities: extremities normal, atraumatic, no cyanosis or edema Neurologic: Grossly normal  TELEMETRY: Reviewed telemetry pt in afib with variable but intermitent rvr:  ASSESSMENT AND PLAN:  Active Problems:   Atrial fibrillation with RVR (HCC)-atrial fibrillation rate is variable. He is on high-dose Cardizem and metoprolol. Rate is fast this morning however the patient is fairly asymptomatic from this. Will give a dose of IV digoxin 0.25 mg once and see if this: His rate. In the center discharge as soon as his rate is in better control. Continue with anticoagulation.   Diabetes ([place])   Leukocytosis   Fall   Alcoholic hepatitis   Alcohol withdrawal ([place])   Tobacco abuse counseling    Teodoro Spray, MD, [facility] 08/15/2016 9:52 AM

## 2016-08-15 NOTE — Progress Notes (Signed)
[place] at [place] NAME: Brendan Edwards    MR#:  [id number]  DATE OF BIRTH:  1945-06-28  SUBJECTIVE:  CHIEF COMPLAINT:   Chief Complaint  Patient presents with  . Fall     Came after a fall, found to have rib fracture and also A fib with RVR.    Rate is controlled with cardizem drip, now oral cardizem and metoprolol.    Had alcohol withdrawal , on precedex drip.   Now came off the drip and alert and oriented.   He denies regular use of alcohol, but his daughter came out of room, and told- he drinks everyday.   Again today have A FIB with RVR. Denies using oxycodone as he is " allergic"  REVIEW OF SYSTEMS:   Review of Systems  Constitutional: Negative for chills, fever and malaise/fatigue.  HENT: Negative for congestion and hearing loss.   Eyes: Negative for blurred vision and discharge.  Respiratory: Negative for cough, sputum production and shortness of breath.   Cardiovascular: Positive for chest pain. Negative for palpitations and leg swelling.  Gastrointestinal: Negative for abdominal pain, diarrhea, nausea and vomiting.  Genitourinary: Negative for dysuria and frequency.  Musculoskeletal: Negative for myalgias.  Skin: Negative for rash.  Neurological: Negative for dizziness, tremors and sensory change.  Psychiatric/Behavioral: Negative for depression and suicidal ideas.    DRUG ALLERGIES:   Allergies  Allergen Reactions  . Oxycodone Other (See Comments)    Hallucinations  . Cardura [Doxazosin Mesylate]   . Darvon [Propoxyphene]   . Penicillins   . Requip [Ropinirole Hcl]   . Septra [Sulfamethoxazole-Trimethoprim]     VITALS:  Blood pressure (!) 148/100, pulse (!) 126, temperature 97.6 F (36.4 C), temperature source Oral, resp. rate 20, height 5\' 8"  (1.727 m), weight 95.8 kg (211 lb 3.2 oz), SpO2 96 %.  PHYSICAL EXAMINATION:  GENERAL:  71 y.o.-year-old patient lying in the bed with no acute distress.  EYES: Pupils  equal, round, reactive to light . No scleral icterus. Extraocular muscles intact.  HEENT: Head atraumatic, normocephalic. Oropharynx and nasopharynx clear.  NECK:  Supple, no jugular venous distention. No thyroid enlargement, no tenderness.  LUNGS: Normal breath sounds bilaterally, no wheezing, rales,rhonchi or crepitation. No use of accessory muscles of respiration. Tender right side back. CARDIOVASCULAR: S1, S2 fast, irregular. No murmurs, rubs, or gallops.  ABDOMEN: Soft, nontender, nondistended. Bowel sounds present. No organomegaly or mass.  EXTREMITIES: No pedal edema, cyanosis, or clubbing.  NEUROLOGIC: cranial nerves- intact, power /5 all limbs follows commands, sensation intact, gait not checked. PSYCHIATRIC: The patient is alert and oriented  SKIN: No obvious rash, lesion, or ulcer.   Physical Exam LABORATORY PANEL:   CBC  Recent Labs Lab 08/15/16 0959  WBC 10.7*  HGB 15.7  HCT 45.4  PLT 227   ------------------------------------------------------------------------------------------------------------------  Chemistries   Recent Labs Lab 08/10/16 1359  08/15/16 0959  NA 139  < > 131*  K 4.1  < > 3.4*  CL 101  < > 92*  CO2 22  < > 30  GLUCOSE 160*  < > 195*  BUN 11  < > 17  CREATININE 0.70  < > 0.84  CALCIUM 8.8*  < > 8.4*  MG 1.1*  --   --   AST 50*  --   --   ALT 41  --   --   ALKPHOS 91  --   --   BILITOT 1.0  --   --   < > =  values in this interval not displayed. ------------------------------------------------------------------------------------------------------------------  Cardiac Enzymes  Recent Labs Lab 08/11/16 0216 08/11/16 0745  TROPONINI <0.03 <0.03   ------------------------------------------------------------------------------------------------------------------  RADIOLOGY:  No results found.  ASSESSMENT AND PLAN:   Active Problems:   Atrial fibrillation with RVR (HCC)   Diabetes (HCC)   Leukocytosis   Fall   Alcoholic  hepatitis   Alcohol withdrawal ([place])   Tobacco abuse counseling  # Alcohol withdrawal,  pt was having active DT's, started on precedex drip.  initiate CIWA scale,  Low magnesium level- replaced , initiate thiamine, high-dose, folic acid.    08/13/16- came off the precedex drip, moved to medical floor and watch for DT. Stable now. # A. fib, RVR,   Initially given Cardizem IV drip,   was stable on oral Cardizem CD and metoprolol,  continue Eliquis, appreciated cardiologist involved for recommendations   Likely RVR was due to pain.   Monitor on tele.    Again have RVR ( 08/14/16)- given IV metoprolol.    Also now on iv digoxin as per cardio. # Diabetes mellitus type 2,    Hb A1c- 6.3, continue outpatient medications, sliding scale insulin while in the hospital # Leukocytosis, likely stress related, follow with therapy, no antibody therapy, no obvious infection at present # Alcoholic hepatitis,    Normal LFT, ammonia and platelets. # Tobacco abuse. Counseling, discussed this patient for 4 minutes, refused nicotine replacement therapy # pain with rib fractures  pt refused oxycodone, asking for ibuprofen and tylenol.   PT suggest [facility]HA PT.     All the records are reviewed and case discussed with Care Management/Social Workerr. Management plans discussed with the patient, family and they are in agreement.  CODE STATUS: full.  TOTAL TIME TAKING CARE OF THIS PATIENT: 35 critical care minutes.  Again have A fib with rvr today- giving IV rate controlling meds today.  POSSIBLE D/C IN 2-3 DAYS, DEPENDING ON CLINICAL CONDITION.   Vaughan Basta M.D on 08/15/2016   Between 7am to 6pm - Pager - [phone]  After 6pm go to [email or web address] - password [email or web address] [place] [facility]  Office  [phone]  CC: Primary care physician; Tracie Harrier, MD  Note: This dictation was prepared with Dragon dictation along with smaller phrase technology. Any transcriptional errors that  result from this process are unintentional.

## 2016-08-16 LAB — GLUCOSE, CAPILLARY
GLUCOSE-CAPILLARY: 135 mg/dL — AB (ref 65–99)
GLUCOSE-CAPILLARY: 120 mg/dL — AB (ref 65–99)
GLUCOSE-CAPILLARY: 127 mg/dL — AB (ref 65–99)
Glucose-Capillary: 100 mg/dL — ABNORMAL HIGH (ref 65–99)

## 2016-08-16 MED ORDER — METOPROLOL SUCCINATE ER 50 MG PO TB24
50.0000 mg | ORAL_TABLET | Freq: Every day | ORAL | Status: DC
  Administered 2016-08-16: 50 mg via ORAL
  Filled 2016-08-16: qty 1

## 2016-08-16 MED ORDER — BENZOCAINE 10 % MT GEL
Freq: Four times a day (QID) | OROMUCOSAL | Status: DC | PRN
  Administered 2016-08-16: 1 via OROMUCOSAL
  Administered 2016-08-20: 08:00:00 via OROMUCOSAL
  Filled 2016-08-16: qty 9.4

## 2016-08-16 MED ORDER — DIGOXIN 250 MCG PO TABS
0.2500 mg | ORAL_TABLET | Freq: Every day | ORAL | Status: DC
  Administered 2016-08-16 – 2016-08-17 (×2): 0.25 mg via ORAL
  Filled 2016-08-16 (×2): qty 1

## 2016-08-16 NOTE — Progress Notes (Signed)
[place] [facility] [place] NAME: Brendan Edwards    MR#:  [id number]  DATE OF BIRTH:  04-17-1945  SUBJECTIVE:  CHIEF COMPLAINT:   Chief Complaint  Patient presents with  . Fall     Came after a fall, found to have rib fracture and also A fib with RVR.    Rate was initiallys controlled with cardizem drip, started on oral cardizem and metoprolol.    Had alcohol withdrawal , on precedex drip.   later came off the drip and alert and oriented.   Again have A FIB with RVR. Denies using oxycodone as he is " allergic"  REVIEW OF SYSTEMS:   Review of Systems  Constitutional: Negative for chills, fever and malaise/fatigue.  HENT: Negative for congestion and hearing loss.   Eyes: Negative for blurred vision and discharge.  Respiratory: Negative for cough, sputum production and shortness of breath.   Cardiovascular: Negative for chest pain, palpitations and leg swelling.  Gastrointestinal: Negative for abdominal pain, diarrhea, nausea and vomiting.  Genitourinary: Negative for dysuria and frequency.  Musculoskeletal: Negative for myalgias.  Skin: Negative for rash.  Neurological: Negative for dizziness, tremors and sensory change.  Psychiatric/Behavioral: Negative for depression and suicidal ideas.    DRUG ALLERGIES:   Allergies  Allergen Reactions  . Oxycodone Other (See Comments)    Hallucinations  . Cardura [Doxazosin Mesylate]   . Darvon [Propoxyphene]   . Penicillins   . Requip [Ropinirole Hcl]   . Septra [Sulfamethoxazole-Trimethoprim]     VITALS:  Blood pressure (!) 152/81, pulse (!) 107, temperature 97.6 F (36.4 C), temperature source Oral, resp. rate 20, height 5\' 8"  (1.727 m), weight 95.1 kg (209 lb 10.5 oz), SpO2 95 %.  PHYSICAL EXAMINATION:  GENERAL:  71 y.o.-year-old patient lying in the bed with no acute distress.  EYES: Pupils equal, round, reactive to light . No scleral icterus. Extraocular muscles intact.  HEENT: Head  atraumatic, normocephalic. Oropharynx and nasopharynx clear.  NECK:  Supple, no jugular venous distention. No thyroid enlargement, no tenderness.  LUNGS: Normal breath sounds bilaterally, no wheezing, rales,rhonchi or crepitation. No use of accessory muscles of respiration. Tender right side back. CARDIOVASCULAR: S1, S2 fast, irregular. No murmurs, rubs, or gallops.  ABDOMEN: Soft, nontender, nondistended. Bowel sounds present. No organomegaly or mass.  EXTREMITIES: No pedal edema, cyanosis, or clubbing.  NEUROLOGIC: cranial nerves- intact, power 5/5 all limbs follows commands, sensation intact, gait not checked. PSYCHIATRIC: The patient is alert and oriented  SKIN: No obvious rash, lesion, or ulcer.   Physical Exam LABORATORY PANEL:   CBC  Recent Labs Lab 08/15/16 0959  WBC 10.7*  HGB 15.7  HCT 45.4  PLT 227   ------------------------------------------------------------------------------------------------------------------  Chemistries   Recent Labs Lab 08/10/16 1359  08/15/16 0959  NA 139  < > 131*  K 4.1  < > 3.4*  CL 101  < > 92*  CO2 22  < > 30  GLUCOSE 160*  < > 195*  BUN 11  < > 17  CREATININE 0.70  < > 0.84  CALCIUM 8.8*  < > 8.4*  MG 1.1*  --   --   AST 50*  --   --   ALT 41  --   --   ALKPHOS 91  --   --   BILITOT 1.0  --   --   < > = values in this interval not displayed. ------------------------------------------------------------------------------------------------------------------  Cardiac Enzymes  Recent Labs Lab 08/11/16 0216  08/11/16 0745  TROPONINI <0.03 <0.03   ------------------------------------------------------------------------------------------------------------------  RADIOLOGY:  No results found.  ASSESSMENT AND PLAN:   Active Problems:   Atrial fibrillation with RVR (HCC)   Diabetes (HCC)   Leukocytosis   Fall   Alcoholic hepatitis   Alcohol withdrawal ([place])   Tobacco abuse counseling  # Alcohol withdrawal,  pt was  having active DT's, started on precedex drip.  initiate CIWA scale,  Low magnesium level- replaced , initiate thiamine, high-dose, folic acid.    08/13/16- came off the precedex drip, moved to medical floor and watch for DT. Stable now. # A. fib, RVR,   Initially given Cardizem IV drip,   was stable on oral Cardizem CD and metoprolol,  continue Eliquis, appreciated cardiologist involved for recommendations    Monitor on tele.    Again have RVR ( 08/14/16)- given IV metoprolol. TSH normal.    Also now on iv digoxin as per cardio.     May need cardioversion soon, if does not improve. # Diabetes mellitus type 2,    Hb A1c- 6.3, continue outpatient medications, sliding scale insulin while in the hospital # Leukocytosis, likely stress related, follow with therapy, no antibody therapy, no obvious infection at present # Alcoholic hepatitis,    Normal LFT, ammonia and platelets. # Tobacco abuse. Counseling, discussed this patient for 4 minutes, refused nicotine replacement therapy # pain with rib fractures  pt refused oxycodone, asking for ibuprofen and tylenol.   PT suggest HHA PT.     All the records are reviewed and case discussed with Care Management/Social Workerr. Management plans discussed with the patient, family and they are in agreement.  CODE STATUS: full.  TOTAL TIME TAKING CARE OF THIS PATIENT: 35  minutes.  Again have A fib with rvr today- giving IV rate controlling meds today.  POSSIBLE D/C IN 2-3 DAYS, DEPENDING ON CLINICAL CONDITION.   Vaughan Basta M.D on 08/16/2016   Between 7am to 6pm - Pager - [phone]  After 6pm go to [email or web address] - password [email or web address] [facility]  Office  [phone]  CC: Primary care physician; Tracie Harrier, MD  Note: This dictation was prepared with Dragon dictation along with smaller phrase technology. Any transcriptional errors that result from this process are unintentional.

## 2016-08-16 NOTE — Progress Notes (Signed)
bipap mask applied. Patient in no distress. Good seal noted

## 2016-08-16 NOTE — Progress Notes (Signed)
Pt placed on [facility] CPAP for sleep. CPAP is plugged into red outlet. 3L O2 in line.

## 2016-08-16 NOTE — Progress Notes (Signed)
       [facility]  SUBJECTIVE: Some sob. Tele reveals afib with variable but mostly rvr.    Vitals:   08/16/16 0422 08/16/16 0500 08/16/16 0750 08/16/16 1248  BP: (!) 174/83  (!) 160/98 122/74  Pulse: (!) 113  (!) 136 85  Resp:   16 16  Temp:  97.8 F (36.6 C) 98 F (36.7 C) 97.6 F (36.4 C)  TempSrc:   Oral   SpO2:   95% 94%  Weight:  95.1 kg (209 lb 10.5 oz)    Height:        Intake/Output Summary (Last 24 hours) at 08/16/16 1303 Last data filed at 08/16/16 0900  Gross per 24 hour  Intake              240 ml  Output             2100 ml  Net            -1860 ml    LABS: Basic Metabolic Panel:  Recent Labs  08/15/16 0959  NA 131*  K 3.4*  CL 92*  CO2 30  GLUCOSE 195*  BUN 17  CREATININE 0.84  CALCIUM 8.4*   Liver Function Tests: No results for input(s): AST, ALT, ALKPHOS, BILITOT, PROT, ALBUMIN in the last 72 hours. No results for input(s): LIPASE, AMYLASE in the last 72 hours. CBC:  Recent Labs  08/15/16 0959  WBC 10.7*  HGB 15.7  HCT 45.4  MCV 103.7*  PLT 227   Cardiac Enzymes: No results for input(s): CKTOTAL, CKMB, CKMBINDEX, TROPONINI in the last 72 hours. BNP: Invalid input(s): POCBNP D-Dimer: No results for input(s): DDIMER in the last 72 hours. Hemoglobin A1C: No results for input(s): HGBA1C in the last 72 hours. Fasting Lipid Panel: No results for input(s): CHOL, HDL, LDLCALC, TRIG, CHOLHDL, LDLDIRECT in the last 72 hours. Thyroid Function Tests: No results for input(s): TSH, T4TOTAL, T3FREE, THYROIDAB in the last 72 hours.  Invalid input(s): FREET3 Anemia Panel: No results for input(s): VITAMINB12, FOLATE, FERRITIN, TIBC, IRON, RETICCTPCT in the last 72 hours.   Physical Exam: Blood pressure 122/74, pulse 85, temperature 97.6 F (36.4 C), resp. rate 16, height 5\' 8"  (1.727 m), weight 95.1 kg (209 lb 10.5 oz), SpO2 94 %.   Wt Readings from Last 1 Encounters:  08/16/16 95.1 kg (209  lb 10.5 oz)     General appearance: alert and cooperative Resp: rhonchi bibasilar Cardio: irregularly irregular rhythm GI: soft, non-tender; bowel sounds normal; no masses,  no organomegaly Extremities: extremities normal, atraumatic, no cyanosis or edema Pulses: 2+ and symmetric Neurologic: Grossly normal  TELEMETRY: Reviewed telemetry pt in afib with rvr:  ASSESSMENT AND PLAN:  Active Problems:   Atrial fibrillation with RVR (HCC)-has been in chronic afib with anticoagulation with apixiban. Rate has been difficult to control. He is currently on cardizem cd at 360 mg , metoprolol xl 100 mg daily and was started on digoxin 0.25 mg po this am. Will Change metoprolol succinate to 100 mg q am and 50 mg q pm If not well rate controlled, will consider cardioversion .     Diabetes ([place])   Leukocytosis   Fall   Alcoholic hepatitis   Alcohol withdrawal ([place])   Tobacco abuse counseling    Teodoro Spray, MD, [facility] 08/16/2016 1:03 PM

## 2016-08-17 LAB — GLUCOSE, CAPILLARY
GLUCOSE-CAPILLARY: 158 mg/dL — AB (ref 65–99)
Glucose-Capillary: 136 mg/dL — ABNORMAL HIGH (ref 65–99)
Glucose-Capillary: 102 mg/dL — ABNORMAL HIGH (ref 65–99)
Glucose-Capillary: 112 mg/dL — ABNORMAL HIGH (ref 65–99)

## 2016-08-17 LAB — BASIC METABOLIC PANEL
ANION GAP: 7 (ref 5–15)
BUN: 14 mg/dL (ref 6–20)
CALCIUM: 9.1 mg/dL (ref 8.9–10.3)
CO2: 31 mmol/L (ref 22–32)
CREATININE: 0.87 mg/dL (ref 0.61–1.24)
Chloride: 101 mmol/L (ref 101–111)
Glucose, Bld: 107 mg/dL — ABNORMAL HIGH (ref 65–99)
Potassium: 4.2 mmol/L (ref 3.5–5.1)
SODIUM: 139 mmol/L (ref 135–145)

## 2016-08-17 LAB — MAGNESIUM: MAGNESIUM: 2 mg/dL (ref 1.7–2.4)

## 2016-08-17 MED ORDER — AMIODARONE HCL 200 MG PO TABS
400.0000 mg | ORAL_TABLET | Freq: Two times a day (BID) | ORAL | Status: DC
  Administered 2016-08-17 – 2016-08-20 (×7): 400 mg via ORAL
  Filled 2016-08-17 (×7): qty 2

## 2016-08-17 MED ORDER — INSULIN ASPART 100 UNIT/ML ~~LOC~~ SOLN
3.0000 [IU] | Freq: Three times a day (TID) | SUBCUTANEOUS | Status: DC
  Administered 2016-08-17 – 2016-08-20 (×6): 3 [IU] via SUBCUTANEOUS
  Filled 2016-08-17 (×6): qty 3

## 2016-08-17 NOTE — Progress Notes (Signed)
HR sustaining 130s-140s. Patient sleeping. IV medication given. No changes. MD notified. Oral cardizem dose given early. Will monitor

## 2016-08-17 NOTE — Clinical Social Work Note (Signed)
CSW received referral for SNF.  Case discussed with case manager and plan is to discharge home with home health.  CSW to sign off please re-consult if social work needs arise.  Alenah Sarria R. Lauramae Kneisley, MSW, LCSWA [phone]  

## 2016-08-17 NOTE — Progress Notes (Signed)
Inpatient Diabetes Program Recommendations  AACE/ADA: New Consensus Statement on Inpatient Glycemic Control (2015)  Target Ranges:  Prepandial:   less than 140 mg/dL      Peak postprandial:   less than 180 mg/dL (1-2 hours)      Critically ill patients:  140 - 180 mg/dL   Results for MARSHAL, ESKEW (MRN [id number]) as of 08/17/2016 09:49  Ref. Range 08/16/2016 08:21 08/16/2016 11:45 08/16/2016 16:35 08/16/2016 21:12 08/17/2016 07:49  Glucose-Capillary Latest Ref Range: 65 - 99 mg/dL 135 (H)  2 units Novolog 120 (H)  0 units Novolog 100 (H)  0 units Novolog 127 (H)  0 units Novolog 158 (H)   Results for EILAM, SHREWSBURY (MRN [id number]) as of 08/17/2016 09:49  Ref. Range 08/10/2016 13:59  Hemoglobin A1C Latest Ref Range: 4.8 - 5.6 % 6.3 (H)    Admit with: Fall/ Rib Fracture/ A Fib with RVR  History: DM2, ETOH  Home DM Meds: None  Current Insulin Orders: Novolog Moderate Correction Scale/ SSI (0-15 units) TID AC + HS      Novolog 4 units TID with meals     MD- Per nursing notes, patient was only given 2 units Novolog yesterday all day (given at 8am).  Please consider the following in-hospital insulin adjustments:  1. Reduce Novolog SSI to Sensitive scale (0-9 units) TID AC + HS  2. Reduce Novolog Meal Coverage to: Novolog 2 units TID with meals (hold if pt eats <50% of meal)      --Will follow patient during hospitalization--  Wyn Quaker RN, MSN, CDE Diabetes Coordinator Inpatient Glycemic Control Team Team Pager: [phone] (8a-5p)

## 2016-08-17 NOTE — Progress Notes (Signed)
       [facility]  SUBJECTIVE: tachycardia. Mild sob   Vitals:   08/16/16 0750 08/16/16 1248 08/16/16 2013 08/17/16 0429  BP: (!) 160/98 122/74 (!) 152/81 (!) 158/106  Pulse: (!) 136 85 (!) 107 (!) 125  Resp: 16 16 20 18   Temp: 98 F (36.7 C) 97.6 F (36.4 C) 97.6 F (36.4 C) 98.5 F (36.9 C)  TempSrc: Oral  Oral   SpO2: 95% 94% 95% 98%  Weight:    94.8 kg (209 lb)  Height:        Intake/Output Summary (Last 24 hours) at 08/17/16 1022 Last data filed at 08/17/16 1007  Gross per 24 hour  Intake              360 ml  Output             1150 ml  Net             -790 ml    LABS: Basic Metabolic Panel:  Recent Labs  08/15/16 0959 08/17/16 0440  NA 131* 139  K 3.4* 4.2  CL 92* 101  CO2 30 31  GLUCOSE 195* 107*  BUN 17 14  CREATININE 0.84 0.87  CALCIUM 8.4* 9.1  MG  --  2.0   Liver Function Tests: No results for input(s): AST, ALT, ALKPHOS, BILITOT, PROT, ALBUMIN in the last 72 hours. No results for input(s): LIPASE, AMYLASE in the last 72 hours. CBC:  Recent Labs  08/15/16 0959  WBC 10.7*  HGB 15.7  HCT 45.4  MCV 103.7*  PLT 227   Cardiac Enzymes: No results for input(s): CKTOTAL, CKMB, CKMBINDEX, TROPONINI in the last 72 hours. BNP: Invalid input(s): POCBNP D-Dimer: No results for input(s): DDIMER in the last 72 hours. Hemoglobin A1C: No results for input(s): HGBA1C in the last 72 hours. Fasting Lipid Panel: No results for input(s): CHOL, HDL, LDLCALC, TRIG, CHOLHDL, LDLDIRECT in the last 72 hours. Thyroid Function Tests: No results for input(s): TSH, T4TOTAL, T3FREE, THYROIDAB in the last 72 hours.  Invalid input(s): FREET3 Anemia Panel: No results for input(s): VITAMINB12, FOLATE, FERRITIN, TIBC, IRON, RETICCTPCT in the last 72 hours.   Physical Exam: Blood pressure (!) 158/106, pulse (!) 125, temperature 98.5 F (36.9 C), resp. rate 18, height 5\' 8"  (1.727 m), weight 94.8 kg (209 lb), SpO2 98  %.   Wt Readings from Last 1 Encounters:  08/17/16 94.8 kg (209 lb)     General appearance: alert and cooperative Resp: rhonchi bibasilar Cardio: irregularly irregular rhythm Neurologic: Grossly normal  TELEMETRY: Reviewed telemetry pt in afib with rvr:  ASSESSMENT AND PLAN:  Active Problems:   Atrial fibrillation with RVR (HCC)-rate remains rapid. Will load with amidarone orally and taper off of metoprolol as rate improves. Continue with apixiban.      Teodoro Spray, MD, [facility] 08/17/2016 10:22 AM

## 2016-08-17 NOTE — Progress Notes (Signed)
[place] [facility] [place] NAME: Brendan Edwards    MR#:  [id number]  DATE OF BIRTH:  [birth year]  SUBJECTIVE:  CHIEF COMPLAINT:   Chief Complaint  Patient presents with  . Fall     Came after a fall, found to have rib fracture and also A fib with RVR.    Rate was initiallys controlled with cardizem drip, started on oral cardizem and metoprolol.    Had alcohol withdrawal , on precedex drip.   later came off the drip and alert and oriented.   Again have A FIB with RVR. Denies using oxycodone as he is " allergic"  REVIEW OF SYSTEMS:   Review of Systems  Constitutional: Negative for chills, fever and malaise/fatigue.  HENT: Negative for congestion and hearing loss.   Eyes: Negative for blurred vision and discharge.  Respiratory: Negative for cough, sputum production and shortness of breath.   Cardiovascular: Negative for chest pain, palpitations and leg swelling.  Gastrointestinal: Negative for abdominal pain, diarrhea, nausea and vomiting.  Genitourinary: Negative for dysuria and frequency.  Musculoskeletal: Negative for myalgias.  Skin: Negative for rash.  Neurological: Negative for dizziness, tremors and sensory change.  Psychiatric/Behavioral: Negative for depression and suicidal ideas.    DRUG ALLERGIES:   Allergies  Allergen Reactions  . Oxycodone Other (See Comments)    Hallucinations  . Cardura [Doxazosin Mesylate]   . Darvon [Propoxyphene]   . Penicillins   . Requip [Ropinirole Hcl]   . Septra [Sulfamethoxazole-Trimethoprim]     VITALS:  Blood pressure (!) 158/106, pulse (!) 125, temperature 98.5 F (36.9 C), resp. rate 18, height 5\' 8"  (1.727 m), weight 94.8 kg (209 lb), SpO2 98 %.  PHYSICAL EXAMINATION:  GENERAL:  71 y.o.-year-old patient lying in the bed with no acute distress.  EYES: Pupils equal, round, reactive to light . No scleral icterus. Extraocular muscles intact.  HEENT: Head atraumatic, normocephalic. Oropharynx  and nasopharynx clear.  NECK:  Supple, no jugular venous distention. No thyroid enlargement, no tenderness.  LUNGS: Normal breath sounds bilaterally, no wheezing, rales,rhonchi or crepitation. No use of accessory muscles of respiration. Tender right side back. CARDIOVASCULAR:  Irregular, tachycardic.  ABDOMEN: Soft, nontender, nondistended. Bowel sounds present. No organomegaly or mass.  EXTREMITIES: No pedal edema, cyanosis, or clubbing.  NEUROLOGIC: cranial nerves- intact, power 5/5 all limbs follows commands, sensation intact, gait not checked. PSYCHIATRIC: The patient is alert and oriented  SKIN: No obvious rash, lesion, or ulcer.   Physical Exam LABORATORY PANEL:   CBC  Recent Labs Lab 08/15/16 0959  WBC 10.7*  HGB 15.7  HCT 45.4  PLT 227   ------------------------------------------------------------------------------------------------------------------  Chemistries   Recent Labs Lab 08/10/16 1359  08/17/16 0440  NA 139  < > 139  K 4.1  < > 4.2  CL 101  < > 101  CO2 22  < > 31  GLUCOSE 160*  < > 107*  BUN 11  < > 14  CREATININE 0.70  < > 0.87  CALCIUM 8.8*  < > 9.1  MG 1.1*  --  2.0  AST 50*  --   --   ALT 41  --   --   ALKPHOS 91  --   --   BILITOT 1.0  --   --   < > = values in this interval not displayed. ------------------------------------------------------------------------------------------------------------------  Cardiac Enzymes  Recent Labs Lab 08/11/16 0216 08/11/16 0745  TROPONINI <0.03 <0.03   ------------------------------------------------------------------------------------------------------------------  RADIOLOGY:  No  results found.  ASSESSMENT AND PLAN:   Active Problems:   Atrial fibrillation with RVR (HCC)   Diabetes (HCC)   Leukocytosis   Fall   Alcoholic hepatitis   Alcohol withdrawal ([place])   Tobacco abuse counseling  # A. fib, RVR,   was stable on oral Cardizem CD and metoprolol but now tachycardic  continue  Eliquis Dr. Ubaldo Glassing seeing patient    Monitor on tele. Start amiodarone today due to rapid ventricular rate  # Alcohol withdrawal,  pt was having active DT's, started on precedex drip.  initiate CIWA scale,  Low magnesium level- replaced , initiate thiamine, high-dose, folic acid.    08/13/16- came off the precedex drip Stable now.  # Diabetes mellitus type 2,    Hb A1c- 6.3, continue outpatient medications, sliding scale insulin while in the hospital Decrease Pre meal Novolog  # Leukocytosis, likely stress related, follow with therapy, no antibiotic therapy, no obvious infection at present. Resolved  # Tobacco abuse. Counseled this admission  # Right 4,5 rib fractures Pain well controlled  # Weakness Needs HH at discharge     All the records are reviewed and case discussed with Care Management/Social Worker Management plans discussed with the patient, family and they are in agreement.  CODE STATUS: Full.  TOTAL TIME TAKING CARE OF THIS PATIENT: 35 minutes.   POSSIBLE D/C IN 2-3 DAYS, DEPENDING ON CLINICAL CONDITION.  Hillary Bow R M.D on 08/17/2016   Between 7am to 6pm - Pager - [phone]  After 6pm go to [email or web address] - password [email or web address] [facility]  Office  [phone]  CC: Primary care physician; Tracie Harrier, MD  Note: This dictation was prepared with Dragon dictation along with smaller phrase technology. Any transcriptional errors that result from this process are unintentional.

## 2016-08-17 NOTE — Care Management (Signed)
Barrier- Heart rate sustaining at 130s- 140s.  Patient is verbally agreeable to home health

## 2016-08-17 NOTE — Plan of Care (Signed)
Problem: Safety: Goal: Ability to remain free from injury will improve Outcome: Progressing Pt is a high fall risk, exit alarm is activated. Pt is encouraged to call for assistance with activity.

## 2016-08-17 NOTE — Progress Notes (Signed)
PT Cancellation Note  Patient Details Name: Brendan Edwards MRN: [id number] DOB: April 06, 1946   Cancelled Treatment:    Reason Eval/Treat Not Completed: Medical issues which prohibited therapy   HR noted to be 130's-140's sustained at rest.  Therapy held this am per protocols.  Will continue as appropriate.   Chesley Noon 08/17/2016, 9:35 AM

## 2016-08-18 LAB — GLUCOSE, CAPILLARY
GLUCOSE-CAPILLARY: 131 mg/dL — AB (ref 65–99)
GLUCOSE-CAPILLARY: 174 mg/dL — AB (ref 65–99)
Glucose-Capillary: 111 mg/dL — ABNORMAL HIGH (ref 65–99)
Glucose-Capillary: 134 mg/dL — ABNORMAL HIGH (ref 65–99)

## 2016-08-18 MED ORDER — METOPROLOL SUCCINATE ER 50 MG PO TB24
50.0000 mg | ORAL_TABLET | Freq: Every day | ORAL | Status: DC
  Administered 2016-08-19: 50 mg via ORAL
  Filled 2016-08-18: qty 1

## 2016-08-18 NOTE — Plan of Care (Signed)
Problem: Safety: Goal: Ability to remain free from injury will improve Outcome: Not Progressing Patient refuses help from staff. Patient fell while at home. Patient ignores instructions to call for help.

## 2016-08-18 NOTE — Progress Notes (Signed)
PT Cancellation Note  Patient Details Name: Brendan Edwards MRN: [id number] DOB: 1945/12/24   Cancelled Treatment:    Reason Eval/Treat Not Completed: Medical issues which prohibited therapy   HR monitored this am.  Pt 140/150's at rest around 9:00, had recently received cardiac medication.  Returned after 1 hour.  HR  160/170's.  Went to check pt and he had just returned from bathroom with nursing staff.  Tech reported he did well with walker to/from bathroom.  Held session at this time due to HR.  Will continue to monitor and provide therapy as appropriate per protocols.  Discussed with primary PT.   Chesley Noon 08/18/2016, 9:59 AM

## 2016-08-18 NOTE — Plan of Care (Signed)
Problem: Safety: Goal: Ability to remain free from injury will improve Outcome: Progressing Patient is compliant with safety measures

## 2016-08-18 NOTE — Progress Notes (Signed)
       [place] [facility]  SUBJECTIVE: some sob   Vitals:   08/18/16 0603 08/18/16 0604 08/18/16 0737 08/18/16 1135  BP: (!) 154/101 (!) 142/88 (!) 165/93 (!) 148/80  Pulse: (!) 123 91 100 (!) 47  Resp: 16  16 19   Temp: 97.6 F (36.4 C)  98.3 F (36.8 C) 97.7 F (36.5 C)  TempSrc: Oral  Oral   SpO2: 97% 98% 96% 92%  Weight:  92.9 kg (204 lb 12.8 oz)    Height:        Intake/Output Summary (Last 24 hours) at 08/18/16 1544 Last data filed at 08/18/16 1521  Gross per 24 hour  Intake             1000 ml  Output             2526 ml  Net            -1526 ml    LABS: Basic Metabolic Panel:  Recent Labs  08/17/16 0440  NA 139  K 4.2  CL 101  CO2 31  GLUCOSE 107*  BUN 14  CREATININE 0.87  CALCIUM 9.1  MG 2.0   Liver Function Tests: No results for input(s): AST, ALT, ALKPHOS, BILITOT, PROT, ALBUMIN in the last 72 hours. No results for input(s): LIPASE, AMYLASE in the last 72 hours. CBC: No results for input(s): WBC, NEUTROABS, HGB, HCT, MCV, PLT in the last 72 hours. Cardiac Enzymes: No results for input(s): CKTOTAL, CKMB, CKMBINDEX, TROPONINI in the last 72 hours. BNP: Invalid input(s): POCBNP D-Dimer: No results for input(s): DDIMER in the last 72 hours. Hemoglobin A1C: No results for input(s): HGBA1C in the last 72 hours. Fasting Lipid Panel: No results for input(s): CHOL, HDL, LDLCALC, TRIG, CHOLHDL, LDLDIRECT in the last 72 hours. Thyroid Function Tests: No results for input(s): TSH, T4TOTAL, T3FREE, THYROIDAB in the last 72 hours.  Invalid input(s): FREET3 Anemia Panel: No results for input(s): VITAMINB12, FOLATE, FERRITIN, TIBC, IRON, RETICCTPCT in the last 72 hours.   Physical Exam: Blood pressure (!) 148/80, pulse (!) 47, temperature 97.7 F (36.5 C), resp. rate 19, height 5\' 8"  (1.727 m), weight 92.9 kg (204 lb 12.8 oz), SpO2 92 %.   Wt Readings from Last 1 Encounters:  08/18/16 92.9 kg (204 lb 12.8 oz)     General appearance: alert and cooperative Resp: rhonchi bilaterally Cardio: irregularly irregular rhythm Neurologic: Grossly normal  TELEMETRY: Reviewed telemetry pt in afib with variable vr:  ASSESSMENT AND PLAN:  Active Problems:   Atrial fibrillation with RVR (HCC)-rate has been difficult to control. Have started on amiodarone. Had some brady today. WIll reduce metoprolol to 50    Diabetes ([place])   Leukocytosis   Fall   Alcoholic hepatitis   Alcohol withdrawal ([place])   Tobacco abuse counseling    Teodoro Spray, MD, [facility] 08/18/2016 3:44 PM

## 2016-08-18 NOTE — Progress Notes (Signed)
[facility] [place] NAME: Brendan Edwards    MR#:  [id number]  DATE OF BIRTH:  1946/03/21  SUBJECTIVE:  CHIEF COMPLAINT:   Chief Complaint  Patient presents with  . Fall     Came after a fall, found to have rib fracture and also A fib with RVR.    Rate was initiallys controlled with cardizem drip, started on oral cardizem and metoprolol.    Had alcohol withdrawal , on precedex drip.   later came off the drip and alert and oriented.  Heart rate fluctuating from 140-160. No palpitations or chest pain or shortness of breath.  REVIEW OF SYSTEMS:   Review of Systems  Constitutional: Negative for chills, fever and malaise/fatigue.  HENT: Negative for congestion and hearing loss.   Eyes: Negative for blurred vision and discharge.  Respiratory: Negative for cough, sputum production and shortness of breath.   Cardiovascular: Negative for chest pain, palpitations and leg swelling.  Gastrointestinal: Negative for abdominal pain, diarrhea, nausea and vomiting.  Genitourinary: Negative for dysuria and frequency.  Musculoskeletal: Negative for myalgias.  Skin: Negative for rash.  Neurological: Negative for dizziness, tremors and sensory change.  Psychiatric/Behavioral: Negative for depression and suicidal ideas.    DRUG ALLERGIES:   Allergies  Allergen Reactions  . Oxycodone Other (See Comments)    Hallucinations  . Cardura [Doxazosin Mesylate]   . Darvon [Propoxyphene]   . Penicillins   . Requip [Ropinirole Hcl]   . Septra [Sulfamethoxazole-Trimethoprim]     VITALS:  Blood pressure (!) 148/80, pulse (!) 47, temperature 97.7 F (36.5 C), resp. rate 19, height 5\' 8"  (1.727 m), weight 92.9 kg (204 lb 12.8 oz), SpO2 92 %.  PHYSICAL EXAMINATION:  GENERAL:  71 y.o.-year-old patient lying in the bed with no acute distress.  EYES: Pupils equal, round, reactive to light . No scleral icterus. Extraocular muscles intact.  HEENT: Head atraumatic,  normocephalic. Oropharynx and nasopharynx clear.  NECK:  Supple, no jugular venous distention. No thyroid enlargement, no tenderness.  LUNGS: Normal breath sounds bilaterally, no wheezing, rales,rhonchi or crepitation. No use of accessory muscles of respiration. Tender right side back. CARDIOVASCULAR:  Irregular, tachycardic.  ABDOMEN: Soft, nontender, nondistended. Bowel sounds present. No organomegaly or mass.  EXTREMITIES: No pedal edema, cyanosis, or clubbing.  NEUROLOGIC: cranial nerves- intact, power 5/5 all limbs follows commands, sensation intact, gait not checked. PSYCHIATRIC: The patient is alert and oriented  SKIN: No obvious rash, lesion, or ulcer.   Physical Exam LABORATORY PANEL:   CBC  Recent Labs Lab 08/15/16 0959  WBC 10.7*  HGB 15.7  HCT 45.4  PLT 227   ------------------------------------------------------------------------------------------------------------------  Chemistries   Recent Labs Lab 08/17/16 0440  NA 139  K 4.2  CL 101  CO2 31  GLUCOSE 107*  BUN 14  CREATININE 0.87  CALCIUM 9.1  MG 2.0   ------------------------------------------------------------------------------------------------------------------  Cardiac Enzymes No results for input(s): TROPONINI in the last 168 hours. ------------------------------------------------------------------------------------------------------------------  RADIOLOGY:  No results found.  ASSESSMENT AND PLAN:   Active Problems:   Atrial fibrillation with RVR (HCC)   Diabetes (HCC)   Leukocytosis   Fall   Alcoholic hepatitis   Alcohol withdrawal ([place])   Tobacco abuse counseling  # A. fib, RVR,   was stable on oral Cardizem CD and metoprolol but now tachycardic  continue Eliquis    Monitor on tele. Started amiodarone yesterday due to rapid ventricular rate Discussed with Dr. Ubaldo Glassing. Continue same medications and monitor.  #  Alcohol withdrawal,  pt was having active DT's, started on precedex  drip.  initiated CIWA scale,  Low magnesium level- replaced , thiamine,  folic acid.    08/13/16- came off the precedex drip Stable now.  # Diabetes mellitus type 2,    Hb A1c- 6.3, continue outpatient medications, sliding scale insulin while in the hospital Decrease Pre meal Novolog  # Leukocytosis, likely stress related, follow with therapy, no antibiotic therapy, no obvious infection at present. Resolved  # Tobacco abuse. Counseled this admission  # Right 4,5 rib fractures Pain well controlled  # Weakness Needs HH at discharge  All the records are reviewed and case discussed with Care Management/Social Worker Management plans discussed with the patient, family and they are in agreement.  CODE STATUS: Full.  TOTAL TIME TAKING CARE OF THIS PATIENT: 35 minutes.   POSSIBLE D/C IN 2-3 DAYS, DEPENDING ON CLINICAL CONDITION.  Hillary Bow R M.D on 08/18/2016   Between 7am to 6pm - Pager - [phone]  After 6pm go to [email or web address] - password [email or web address] [facility]  Office  [phone]  CC: Primary care physician; Tracie Harrier, MD  Note: This dictation was prepared with Dragon dictation along with smaller phrase technology. Any transcriptional errors that result from this process are unintentional.

## 2016-08-19 LAB — GLUCOSE, CAPILLARY
GLUCOSE-CAPILLARY: 114 mg/dL — AB (ref 65–99)
GLUCOSE-CAPILLARY: 159 mg/dL — AB (ref 65–99)
GLUCOSE-CAPILLARY: 92 mg/dL (ref 65–99)
Glucose-Capillary: 156 mg/dL — ABNORMAL HIGH (ref 65–99)

## 2016-08-19 NOTE — Progress Notes (Signed)
Pt placed on nasal CPAP. CPAP plugged into red outlet. 3L O2 inline. Pt took strap off mask and it was replaced. Pt asked to leave strap in place

## 2016-08-19 NOTE — Progress Notes (Signed)
[facility] [place] NAME: Brendan Edwards    MR#:  [id number]  DATE OF BIRTH:  07-11-1945  SUBJECTIVE:  CHIEF COMPLAINT:   Chief Complaint  Patient presents with  . Fall     Came after a fall, found to have rib fracture and also A fib with RVR.    Rate was initiallys controlled with cardizem drip, started on oral cardizem and metoprolol.    Had alcohol withdrawal , on precedex drip.   later came off the drip and alert and oriented.  Heart rate fluctuating from 140-160. No palpitations or chest pain or shortness of breath.  REVIEW OF SYSTEMS:   Review of Systems  Constitutional: Negative for chills, fever and malaise/fatigue.  HENT: Negative for congestion and hearing loss.   Eyes: Negative for blurred vision and discharge.  Respiratory: Negative for cough, sputum production and shortness of breath.   Cardiovascular: Negative for chest pain, palpitations and leg swelling.  Gastrointestinal: Negative for abdominal pain, diarrhea, nausea and vomiting.  Genitourinary: Negative for dysuria and frequency.  Musculoskeletal: Negative for myalgias.  Skin: Negative for rash.  Neurological: Negative for dizziness, tremors and sensory change.  Psychiatric/Behavioral: Negative for depression and suicidal ideas.    DRUG ALLERGIES:   Allergies  Allergen Reactions  . Oxycodone Other (See Comments)    Hallucinations  . Cardura [Doxazosin Mesylate]   . Darvon [Propoxyphene]   . Penicillins   . Requip [Ropinirole Hcl]   . Septra [Sulfamethoxazole-Trimethoprim]     VITALS:  Blood pressure (!) 141/87, pulse (!) 103, temperature 98.3 F (36.8 C), temperature source Oral, resp. rate 18, height 5\' 8"  (1.727 m), weight 92.4 kg (203 lb 12.3 oz), SpO2 94 %.  PHYSICAL EXAMINATION:  GENERAL:  71 y.o.-year-old patient lying in the bed with no acute distress.  EYES: Pupils equal, round, reactive to light . No scleral icterus. Extraocular muscles intact.   HEENT: Head atraumatic, normocephalic. Oropharynx and nasopharynx clear.  NECK:  Supple, no jugular venous distention. No thyroid enlargement, no tenderness.  LUNGS: Normal breath sounds bilaterally, no wheezing, rales,rhonchi or crepitation. No use of accessory muscles of respiration. Tender right side back. CARDIOVASCULAR:  Irregular, tachycardic.  ABDOMEN: Soft, nontender, nondistended. Bowel sounds present. No organomegaly or mass.  EXTREMITIES: No pedal edema, cyanosis, or clubbing.  NEUROLOGIC: cranial nerves- intact, power 5/5 all limbs follows commands, sensation intact, gait not checked. PSYCHIATRIC: The patient is alert and oriented  SKIN: No obvious rash, lesion, or ulcer.   Physical Exam LABORATORY PANEL:   CBC  Recent Labs Lab 08/15/16 0959  WBC 10.7*  HGB 15.7  HCT 45.4  PLT 227   ------------------------------------------------------------------------------------------------------------------  Chemistries   Recent Labs Lab 08/17/16 0440  NA 139  K 4.2  CL 101  CO2 31  GLUCOSE 107*  BUN 14  CREATININE 0.87  CALCIUM 9.1  MG 2.0   ------------------------------------------------------------------------------------------------------------------  Cardiac Enzymes No results for input(s): TROPONINI in the last 168 hours. ------------------------------------------------------------------------------------------------------------------  RADIOLOGY:  No results found.  ASSESSMENT AND PLAN:   Active Problems:   Atrial fibrillation with RVR (HCC)   Diabetes (HCC)   Leukocytosis   Fall   Alcoholic hepatitis   Alcohol withdrawal ([place])   Tobacco abuse counseling  # A. fib, RVR,   was stable on oral Cardizem CD and metoprolol.  continue Eliquis    Monitor on tele. Started amiodarone 08/17/2016 due to rapid ventricular rate Discussed with Dr. Ubaldo Glassing. HR improved but still 115-145 at  this time. With amiodarone build up slowly he might be ready by tomorrow  for discharge.  # Alcohol withdrawal,  pt was having active DT's, started on precedex drip.  initiated CIWA scale,  Low magnesium level- replaced , thiamine,  folic acid.    08/13/16- off the precedex drip Stable now.  # Diabetes mellitus type 2,    Hb A1c- 6.3, continue outpatient medications, sliding scale insulin while in the hospital Decreased Pre meal Novolog  # Leukocytosis, likely stress related, follow with therapy, no antibiotic therapy, no obvious infection at present. Resolved  # Tobacco abuse. Counseled this admission  # Right 4,5 rib fractures. 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  CODE STATUS: Full.  TOTAL TIME TAKING CARE OF THIS PATIENT: 35 minutes.   Likely discharge tomorrow  Hillary Bow R M.D on 08/19/2016   Between 7am to 6pm - Pager - [phone]  After 6pm go to [email or web address] - password [email or web address] [facility]  Office  [phone]  CC: Primary care physician; Tracie Harrier, MD  Note: This dictation was prepared with Dragon dictation along with smaller phrase technology. Any transcriptional errors that result from this process are unintentional.

## 2016-08-19 NOTE — Progress Notes (Signed)
Physical Therapy Treatment Patient Details Name: Brendan Edwards MRN: [id number] DOB: 22-Feb-1946 Today's Date: 08/19/2016    History of Present Illness Pt is a 70 y.o. male presenting to hospital s/p fall and presenting with L sided rib pain and jaw pain with swelling.  Pt found to have old R 4-5th rib fx's and T3 and T12 compression deformities of uncertain chronicity.  Pt admitted to step-down unit with a-fib with RVR and now transferred to cardiac floor.  PMH includes a-fib on eliquis, BPH, htn, sleep apnea, cardiomyopathy, B jt replacements, chronic systolic CHF, h/o ETOH abuse.    PT Comments    Pt reports he has been walking to the bathroom with the walker.  Pt able to walk around 160 feet with HR fluctuating between 114-136 bpm and HR occasionally reaching 144 bpm last 40 feet of ambulation (but HR immediately decreasing back into upper 120's/low 130's during ambulation).  End of session pt's HR at rest laying in bed 103-118 bpm.  MD notified of above HR vitals.  Pt overall steady with use of RW although required some cueing to keep LE's within RW with ambulation (especially with turns).  Will continue to progress pt with balance and transition pt to least restrictive assistive device with ambulation as able during hospital stay.   Follow Up Recommendations  Home health PT     Equipment Recommendations  Rolling walker with 5" wheels    Recommendations for Other Services       Precautions / Restrictions Precautions Precautions: Fall Restrictions Weight Bearing Restrictions: No    Mobility  Bed Mobility Overal bed mobility: Modified Independent Bed Mobility: Supine to Sit;Sit to Supine     Supine to sit: Modified independent (Device/Increase time);HOB elevated Sit to supine: Modified independent (Device/Increase time);HOB elevated   General bed mobility comments: no difficulties noted with bed mobility  Transfers Overall transfer level: Modified independent Equipment  used: Rolling walker (2 wheeled) Transfers: Sit to/from Stand Sit to Stand: Modified independent (Device/Increase time)         General transfer comment: steady without loss of balance  Ambulation/Gait Ambulation/Gait assistance: Supervision Ambulation Distance (Feet): 200 Feet Assistive device: Rolling walker (2 wheeled) Gait Pattern/deviations: Step-through pattern Gait velocity: mildly decreased   General Gait Details: intermittent vc's to keep B LE's within RW (especially with turns) but pt overall steady without loss of balance   Stairs            Wheelchair Mobility    Modified Rankin (Stroke Patients Only)       Balance Overall balance assessment: Needs assistance;History of Falls Sitting-balance support: No upper extremity supported;Feet supported Sitting balance-Leahy Scale: Normal     Standing balance support: Bilateral upper extremity supported;During functional activity Standing balance-Leahy Scale: Good Standing balance comment: with ambulation no loss of balance noted                            Cognition Arousal/Alertness: Awake/alert Behavior During Therapy: WFL for tasks assessed/performed Overall Cognitive Status: Within Functional Limits for tasks assessed                                        Exercises      General Comments   Nursing cleared pt for participation in physical therapy.  Pt agreeable to PT session.      Pertinent Vitals/Pain Pain  Assessment: 0-10 Pain Score: 3  Pain Location: L ribs Pain Descriptors / Indicators: Sore;Tender Pain Intervention(s): Limited activity within patient's tolerance;Monitored during session;Repositioned    Home Living                      Prior Function            PT Goals (current goals can now be found in the care plan section) Acute Rehab PT Goals Patient Stated Goal: to have less L rib pain PT Goal Formulation: With patient Time For Goal  Achievement: 08/27/16 Potential to Achieve Goals: Good Additional Goals Additional Goal #1: Perform objective balance assessment. Progress towards PT goals: Progressing toward goals    Frequency    Min 2X/week      PT Plan Current plan remains appropriate    Co-evaluation              AM-PAC PT "6 Clicks" Daily Activity  Outcome Measure  Difficulty turning over in bed (including adjusting bedclothes, sheets and blankets)?: None Difficulty moving from lying on back to sitting on the side of the bed? : None Difficulty sitting down on and standing up from a chair with arms (e.g., wheelchair, bedside commode, etc,.)?: None Help needed moving to and from a bed to chair (including a wheelchair)?: None Help needed walking in hospital room?: A Little Help needed climbing 3-5 steps with a railing? : A Little 6 Click Score: 22    End of Session Equipment Utilized During Treatment: Gait belt Activity Tolerance: Patient tolerated treatment well Patient left: in bed;with call bell/phone within reach;with bed alarm set Nurse Communication: Mobility status;Precautions (MD notified regarding pt's HR with ambulation during session and HR end of session) PT Visit Diagnosis: History of falling (Z91.81);Difficulty in walking, not elsewhere classified (R26.2)     Time: 9323-5573 PT Time Calculation (min) (ACUTE ONLY): 12 min  Charges:  $Therapeutic Activity: 8-22 mins                    G CodesLeitha Bleak, PT 08/19/16, 11:13 AM [phone]

## 2016-08-19 NOTE — Progress Notes (Signed)
       [facility]   SUBJECTIVE: heart rate somewhat better.   Vitals:   08/18/16 0737 08/18/16 1135 08/18/16 2000 08/19/16 0348  BP: (!) 165/93 (!) 148/80 139/87 (!) 141/87  Pulse: 100 (!) 47 70 (!) 103  Resp: 16 19 18 18   Temp: 98.3 F (36.8 C) 97.7 F (36.5 C) 98.2 F (36.8 C) 98.3 F (36.8 C)  TempSrc: Oral  Oral Oral  SpO2: 96% 92% 93% 94%  Weight:    92.4 kg (203 lb 12.3 oz)  Height:        Intake/Output Summary (Last 24 hours) at 08/19/16 0657 Last data filed at 08/19/16 0353  Gross per 24 hour  Intake              760 ml  Output             1901 ml  Net            -1141 ml    LABS: Basic Metabolic Panel:  Recent Labs  08/17/16 0440  NA 139  K 4.2  CL 101  CO2 31  GLUCOSE 107*  BUN 14  CREATININE 0.87  CALCIUM 9.1  MG 2.0   Liver Function Tests: No results for input(s): AST, ALT, ALKPHOS, BILITOT, PROT, ALBUMIN in the last 72 hours. No results for input(s): LIPASE, AMYLASE in the last 72 hours. CBC: No results for input(s): WBC, NEUTROABS, HGB, HCT, MCV, PLT in the last 72 hours. Cardiac Enzymes: No results for input(s): CKTOTAL, CKMB, CKMBINDEX, TROPONINI in the last 72 hours. BNP: Invalid input(s): POCBNP D-Dimer: No results for input(s): DDIMER in the last 72 hours. Hemoglobin A1C: No results for input(s): HGBA1C in the last 72 hours. Fasting Lipid Panel: No results for input(s): CHOL, HDL, LDLCALC, TRIG, CHOLHDL, LDLDIRECT in the last 72 hours. Thyroid Function Tests: No results for input(s): TSH, T4TOTAL, T3FREE, THYROIDAB in the last 72 hours.  Invalid input(s): FREET3 Anemia Panel: No results for input(s): VITAMINB12, FOLATE, FERRITIN, TIBC, IRON, RETICCTPCT in the last 72 hours.   Physical Exam: Blood pressure (!) 141/87, pulse (!) 103, temperature 98.3 F (36.8 C), temperature source Oral, resp. rate 18, height 5\' 8"  (1.727 m), weight 92.4 kg (203 lb 12.3 oz), SpO2 94 %.   Wt Readings  from Last 1 Encounters:  08/19/16 92.4 kg (203 lb 12.3 oz)     General appearance: cooperative and no distress Resp: rhonchi bilaterally Cardio: irregularly irregular rhythm Pulses: 2+ and symmetric Neurologic: Grossly normal  TELEMETRY: Reviewed telemetry pt in afib with variable ventricular rate. Rate 105 this am. Increased to 120's yesterday :  ASSESSMENT AND PLAN:  Active Problems:   Atrial fibrillation with RVR (HCC)-will remain on amiodarone 400 bid and stay on diltiazem 360 daily. Remain on apixiban for anticoagulation. Ambulate today. Consider discharge on this regimen with early follow up with Dr. Saralyn Pilar.    Diabetes ([place])   Leukocytosis   Fall   Alcoholic hepatitis   Alcohol withdrawal ([place])-   Tobacco abuse counseling    Teodoro Spray, MD, [facility] 08/19/2016 6:57 AM

## 2016-08-20 LAB — GLUCOSE, CAPILLARY
GLUCOSE-CAPILLARY: 139 mg/dL — AB (ref 65–99)
GLUCOSE-CAPILLARY: 136 mg/dL — AB (ref 65–99)

## 2016-08-20 MED ORDER — THIAMINE HCL 50 MG PO TABS: 50.0000 mg | ORAL_TABLET | Freq: Every day | ORAL | 0 refills | Status: DC

## 2016-08-20 MED ORDER — METOPROLOL SUCCINATE ER 100 MG PO TB24
100.0000 mg | ORAL_TABLET | Freq: Every day | ORAL | Status: DC
  Administered 2016-08-20: 100 mg via ORAL
  Filled 2016-08-20: qty 1

## 2016-08-20 MED ORDER — BENZOCAINE 10 % MT GEL: Freq: Three times a day (TID) | OROMUCOSAL | Status: DC | PRN

## 2016-08-20 MED ORDER — DILTIAZEM HCL ER COATED BEADS 360 MG PO CP24: 360.0000 mg | ORAL_CAPSULE | Freq: Every day | ORAL | 1 refills | Status: DC

## 2016-08-20 MED ORDER — AMIODARONE HCL 400 MG PO TABS: 400.0000 mg | ORAL_TABLET | Freq: Two times a day (BID) | ORAL | 1 refills | Status: DC

## 2016-08-20 MED ORDER — BENZOCAINE 10 % MT GEL: Freq: Four times a day (QID) | OROMUCOSAL | 0 refills | Status: DC | PRN

## 2016-08-20 MED ORDER — FOLIC ACID 1 MG PO TABS: 1.0000 mg | ORAL_TABLET | Freq: Every day | ORAL | 0 refills | Status: AC

## 2016-08-20 NOTE — Discharge Summary (Signed)
[facility] NAME: Brendan Edwards    MR#:  [id number]  DATE OF BIRTH:  Nov 19, 1945  DATE OF ADMISSION:  08/10/2016 ADMITTING PHYSICIAN: Theodoro Grist, MD  DATE OF DISCHARGE: 08/20/16  PRIMARY CARE PHYSICIAN: Tracie Harrier, MD    ADMISSION DIAGNOSIS:  Chest wall pain [R07.89] Ecchymosis [R58] Fall [W19.XXXA] Atrial fibrillation with RVR (HCC) [I48.91] Lip laceration, initial encounter [W97.989Q] Fall, initial encounter [W19.XXXA]  DISCHARGE DIAGNOSIS:    SECONDARY DIAGNOSIS:   Past Medical History:  Diagnosis Date  . A-fib ([place])   . Benign prostatic hyperplasia   . Cardiomyopathy, secondary ([place])   . GERD (gastroesophageal reflux disease)   . Gout   . Hyperlipidemia   . Hypertension   . Obesity   . Sleep apnea     HOSPITAL COURSE:  Brendan Edwards  is a 71 y.o. male with a known history of Multiple medical problems including paroxysmal atrial ablation, chronic systolic CHF, severe sleep apnea, diabetes mellitus type 2, restless leg syndrome, gastric esophageal reflux disease disease, alcohol abuse, hypertension, hyperlipidemia, who presents to the hospital after he fell down earlier today while tripping on the rug in the bathroom, no loss of consciousness reported  # A. fib, RVR acute on chronic - was stable on oral Cardizem CD and metoprolol. - continue Eliquis -Started amiodarone 08/17/2016 due to rapid ventricular rate Discussed with Dr. Ubaldo Glassing. HR improved but still 1108-112 at this time. With amiodarone build up slowly will cont current regimen and have him see Dr Josefa Half as out pt  # Alcohol withdrawal,  pt was having active DT's, started on precedex drip.  initiated CIWA scale,  Low magnesium level- replaced , thiamine,  folic acid.    08/13/16- off the precedex drip Stable now.  # Diabetes mellitus type 2,    Hb A1c- 6.3, continue outpatient medications, sliding scale insulin while in the  hospital Decreased Pre meal Novolog  # Leukocytosis, likely stress related, follow with therapy, no antibiotic therapy, no obvious infection at present. Resolved  # Tobacco abuse. Counseled this admission  # Right 4,5 rib fractures. 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 Oxycodone Other (See Comments)    Hallucinations  . Cardura [Doxazosin Mesylate]   . Darvon [Propoxyphene]   . Penicillins   . Requip [Ropinirole Hcl]   . Septra [Sulfamethoxazole-Trimethoprim]     DISCHARGE MEDICATIONS:   Current Discharge Medication List    START taking these medications   Details  amiodarone (PACERONE) 400 MG tablet Take 1 tablet (400 mg total) by mouth 2 (two) times daily. Qty: 60 tablet, Refills: 1    benzocaine (ORAJEL) 10 % mucosal gel Use as directed in the mouth or throat 4 (four) times daily as needed for mouth pain. Qty: 5.3 g, Refills: 0    diltiazem (CARDIZEM CD) 360 MG 24 hr capsule Take 1 capsule (360 mg total) by mouth daily. Qty: 30 capsule, Refills: 1    folic acid (FOLVITE) 1 MG tablet Take 1 tablet (1 mg total) by mouth daily. Qty: 30 tablet, Refills: 0    thiamine 50 MG tablet Take 1 tablet (50 mg total) by mouth daily. Qty: 30 tablet, Refills: 0      CONTINUE  these medications which have NOT CHANGED   Details  allopurinol (ZYLOPRIM) 300 MG tablet Take 300 mg by mouth daily.    apixaban (ELIQUIS) 5 MG TABS tablet Take 5 mg by mouth 2 (two) times daily.    furosemide (LASIX) 40 MG tablet Take 40 mg by mouth daily.    lisinopril (PRINIVIL,ZESTRIL) 5 MG tablet Take 5 mg by mouth daily.    metoprolol succinate (TOPROL-XL) 100 MG 24 hr tablet Take 100 mg by mouth daily. Take with or immediately following a meal.    Multiple Vitamin  (MULTIVITAMIN) tablet Take by mouth daily.    potassium chloride SA (K-DUR,KLOR-CON) 20 MEQ tablet Take 20 mEq by mouth 2 (two) times daily.    rOPINIRole (REQUIP) 0.5 MG tablet Take 0.5 mg by mouth 3 (three) times daily.    ibuprofen (ADVIL,MOTRIN) 100 MG/5ML suspension Take 200 mg by mouth every 4 (four) hours as needed.    temazepam (RESTORIL) 30 MG capsule Take 30 mg by mouth at bedtime as needed for sleep.      STOP taking these medications     diltiazem (TIAZAC) 300 MG 24 hr capsule         If you experience worsening of your admission symptoms, develop shortness of breath, life threatening emergency, suicidal or homicidal thoughts you must seek medical attention immediately by calling 911 or calling your MD immediately  if symptoms less severe.  You Must read complete instructions/literature along with all the possible adverse reactions/side effects for all the Medicines you take and that have been prescribed to you. Take any new Medicines after you have completely understood and accept all the possible adverse reactions/side effects.   Please note  You were cared for by a hospitalist during your hospital stay. If you have any questions about your discharge medications or the care you received while you were in the hospital after you are discharged, you can call the unit and asked to speak with the hospitalist on call if the hospitalist that took care of you is not available. Once you are discharged, your primary care physician will handle any further medical issues. Please note that NO REFILLS for any discharge medications will be authorized once you are discharged, as it is imperative that you return to your primary care physician (or establish a relationship with a primary care physician if you do not have one) for your aftercare needs so that they can reassess your need for medications and monitor your lab values. Today   SUBJECTIVE   Doing ok  VITAL SIGNS:  Blood pressure  (!) 146/86, pulse 99, temperature 97.8 F (36.6 C), resp. rate 18, height 5\' 8"  (1.727 m), weight 92 kg (202 lb 11.8 oz), SpO2 97 %.  I/O:   Intake/Output Summary (Last 24 hours) at 08/20/16 0753 Last data filed at 08/20/16 0659  Gross per 24 hour  Intake              480 ml  Output             1445 ml  Net             -965 ml    PHYSICAL EXAMINATION:  GENERAL:  71 y.o.-year-old patient lying in the bed with no acute distress.  EYES: Pupils equal, round, reactive to light and accommodation. No scleral icterus. Extraocular muscles intact.  HEENT: Head atraumatic, normocephalic. Oropharynx and nasopharynx clear.  NECK:  Supple, no jugular venous distention. No thyroid enlargement, no tenderness.  LUNGS:  Normal breath sounds bilaterally, no wheezing, rales,rhonchi or crepitation. No use of accessory muscles of respiration.  CARDIOVASCULAR: S1, S2 normal. No murmurs, rubs, or gallops.  ABDOMEN: Soft, non-tender, non-distended. Bowel sounds present. No organomegaly or mass.  EXTREMITIES: No pedal edema, cyanosis, or clubbing.  NEUROLOGIC: Cranial nerves II through XII are intact. Muscle strength 5/5 in all extremities. Sensation intact. Gait not checked.  PSYCHIATRIC: The patient is alert and oriented x 3.  SKIN: No obvious rash, lesion, or ulcer.   DATA REVIEW:   CBC   Recent Labs Lab 08/15/16 0959  WBC 10.7*  HGB 15.7  HCT 45.4  PLT 227    Chemistries   Recent Labs Lab 08/17/16 0440  NA 139  K 4.2  CL 101  CO2 31  GLUCOSE 107*  BUN 14  CREATININE 0.87  CALCIUM 9.1  MG 2.0    Microbiology Results   Recent Results (from the past 240 hour(s))  MRSA PCR Screening     Status: None   Collection Time: 08/10/16  8:20 PM  Result Value Ref Range Status   MRSA by PCR NEGATIVE NEGATIVE Final    Comment:        The GeneXpert MRSA Assay (FDA approved for NASAL specimens only), is one component of a comprehensive MRSA colonization surveillance program. It is  not intended to diagnose MRSA infection nor to guide or monitor treatment for MRSA infections.     RADIOLOGY:  No results found.   Management plans discussed with the patient, family and they are in agreement.  CODE STATUS:     Code Status Orders        Start     Ordered   08/10/16 2017  Full code  Continuous     08/10/16 2016    Code Status History    Date Active Date Inactive Code Status Order ID Comments User Context   This patient has a current code status but no historical code status.      TOTAL TIME TAKING CARE OF THIS PATIENT: 40 minutes.    Joelys Staubs M.D on 08/20/2016 at 7:53 AM  Between 7am to 6pm - Pager - [phone] After 6pm go to [email or web address] - password [email or web address]  Office  [phone]  CC: Primary care physician; Tracie Harrier, MD

## 2016-08-20 NOTE — Progress Notes (Signed)
Discharge instructions along with home medication list and follow up gone over with patient. Patient verbalized that he understood instruction. Will go over instructions with patients girlfriend per patients request. No c/o pain no distress. Noted. vss. Iv removed x1. Telemetry removed. Patient to be discharged home on ra with home health services arranged. Waiting on ride

## 2016-08-20 NOTE — Care Management Important Message (Signed)
Important Message  Patient Details  Name: Brendan Edwards MRN: [id number] Date of Birth: 1946-01-04   Medicare Important Message Given:  Yes    Katrina Stack, RN 08/20/2016, 9:31 AM

## 2016-08-20 NOTE — Care Management (Signed)
Patient for discharge home today.  [facility] will will make initial visit 5/11.

## 2016-08-24 DIAGNOSIS — M109 Gout, unspecified: Secondary | ICD-10-CM | POA: Diagnosis not present

## 2016-08-24 DIAGNOSIS — E669 Obesity, unspecified: Secondary | ICD-10-CM | POA: Diagnosis not present

## 2016-08-24 DIAGNOSIS — G2581 Restless legs syndrome: Secondary | ICD-10-CM | POA: Diagnosis not present

## 2016-08-24 DIAGNOSIS — K219 Gastro-esophageal reflux disease without esophagitis: Secondary | ICD-10-CM | POA: Diagnosis not present

## 2016-08-24 DIAGNOSIS — S2241XD Multiple fractures of ribs, right side, subsequent encounter for fracture with routine healing: Secondary | ICD-10-CM | POA: Diagnosis not present

## 2016-08-24 DIAGNOSIS — F17210 Nicotine dependence, cigarettes, uncomplicated: Secondary | ICD-10-CM | POA: Diagnosis not present

## 2016-08-24 DIAGNOSIS — K7010 Alcoholic hepatitis without ascites: Secondary | ICD-10-CM | POA: Diagnosis not present

## 2016-08-24 DIAGNOSIS — I110 Hypertensive heart disease with heart failure: Secondary | ICD-10-CM | POA: Diagnosis not present

## 2016-08-24 DIAGNOSIS — E785 Hyperlipidemia, unspecified: Secondary | ICD-10-CM | POA: Diagnosis not present

## 2016-08-24 DIAGNOSIS — E119 Type 2 diabetes mellitus without complications: Secondary | ICD-10-CM | POA: Diagnosis not present

## 2016-08-24 DIAGNOSIS — G4730 Sleep apnea, unspecified: Secondary | ICD-10-CM | POA: Diagnosis not present

## 2016-08-24 DIAGNOSIS — S22030D Wedge compression fracture of third thoracic vertebra, subsequent encounter for fracture with routine healing: Secondary | ICD-10-CM | POA: Diagnosis not present

## 2016-08-24 DIAGNOSIS — S22080D Wedge compression fracture of T11-T12 vertebra, subsequent encounter for fracture with routine healing: Secondary | ICD-10-CM | POA: Diagnosis not present

## 2016-08-24 DIAGNOSIS — N400 Enlarged prostate without lower urinary tract symptoms: Secondary | ICD-10-CM | POA: Diagnosis not present

## 2016-08-24 DIAGNOSIS — I429 Cardiomyopathy, unspecified: Secondary | ICD-10-CM | POA: Diagnosis not present

## 2016-08-24 DIAGNOSIS — I5022 Chronic systolic (congestive) heart failure: Secondary | ICD-10-CM | POA: Diagnosis not present

## 2016-08-24 DIAGNOSIS — F1010 Alcohol abuse, uncomplicated: Secondary | ICD-10-CM | POA: Diagnosis not present

## 2016-08-24 DIAGNOSIS — I480 Paroxysmal atrial fibrillation: Secondary | ICD-10-CM | POA: Diagnosis not present

## 2016-08-27 DIAGNOSIS — W19XXXD Unspecified fall, subsequent encounter: Secondary | ICD-10-CM | POA: Diagnosis not present

## 2016-08-27 DIAGNOSIS — Z09 Encounter for follow-up examination after completed treatment for conditions other than malignant neoplasm: Secondary | ICD-10-CM | POA: Diagnosis not present

## 2016-08-27 DIAGNOSIS — Y92009 Unspecified place in unspecified non-institutional (private) residence as the place of occurrence of the external cause: Secondary | ICD-10-CM | POA: Diagnosis not present

## 2016-08-27 DIAGNOSIS — I480 Paroxysmal atrial fibrillation: Secondary | ICD-10-CM | POA: Diagnosis not present

## 2016-08-27 DIAGNOSIS — G2581 Restless legs syndrome: Secondary | ICD-10-CM | POA: Diagnosis not present

## 2016-08-27 DIAGNOSIS — M25561 Pain in right knee: Secondary | ICD-10-CM | POA: Diagnosis not present

## 2016-08-27 DIAGNOSIS — E119 Type 2 diabetes mellitus without complications: Secondary | ICD-10-CM | POA: Diagnosis not present

## 2016-08-27 DIAGNOSIS — S8001XA Contusion of right knee, initial encounter: Secondary | ICD-10-CM | POA: Diagnosis not present

## 2016-08-27 DIAGNOSIS — I5022 Chronic systolic (congestive) heart failure: Secondary | ICD-10-CM | POA: Diagnosis not present

## 2016-08-27 DIAGNOSIS — G4733 Obstructive sleep apnea (adult) (pediatric): Secondary | ICD-10-CM | POA: Diagnosis not present

## 2016-08-27 DIAGNOSIS — F1029 Alcohol dependence with unspecified alcohol-induced disorder: Secondary | ICD-10-CM | POA: Diagnosis not present

## 2016-08-31 DIAGNOSIS — I420 Dilated cardiomyopathy: Secondary | ICD-10-CM | POA: Diagnosis not present

## 2016-08-31 DIAGNOSIS — I5022 Chronic systolic (congestive) heart failure: Secondary | ICD-10-CM | POA: Diagnosis not present

## 2016-08-31 DIAGNOSIS — I480 Paroxysmal atrial fibrillation: Secondary | ICD-10-CM | POA: Diagnosis not present

## 2016-08-31 DIAGNOSIS — I10 Essential (primary) hypertension: Secondary | ICD-10-CM | POA: Diagnosis not present

## 2016-08-31 DIAGNOSIS — G4733 Obstructive sleep apnea (adult) (pediatric): Secondary | ICD-10-CM | POA: Diagnosis not present

## 2016-08-31 DIAGNOSIS — E119 Type 2 diabetes mellitus without complications: Secondary | ICD-10-CM | POA: Diagnosis not present

## 2016-09-10 DIAGNOSIS — E119 Type 2 diabetes mellitus without complications: Secondary | ICD-10-CM | POA: Diagnosis not present

## 2016-09-10 DIAGNOSIS — I110 Hypertensive heart disease with heart failure: Secondary | ICD-10-CM | POA: Diagnosis not present

## 2016-09-10 DIAGNOSIS — I5022 Chronic systolic (congestive) heart failure: Secondary | ICD-10-CM | POA: Diagnosis not present

## 2016-09-10 DIAGNOSIS — E785 Hyperlipidemia, unspecified: Secondary | ICD-10-CM | POA: Diagnosis not present

## 2016-09-10 DIAGNOSIS — I480 Paroxysmal atrial fibrillation: Secondary | ICD-10-CM | POA: Diagnosis not present

## 2016-10-07 ENCOUNTER — Emergency Department: Payer: PPO

## 2016-10-07 ENCOUNTER — Encounter: Payer: Self-pay | Admitting: Medical Oncology

## 2016-10-07 ENCOUNTER — Inpatient Hospital Stay
Admission: EM | Admit: 2016-10-07 | Discharge: 2016-10-12 | DRG: 308 | Disposition: A | Discharge status: Home Health | Payer: PPO | Attending: Specialist | Admitting: Specialist

## 2016-10-07 DIAGNOSIS — I5043 Acute on chronic combined systolic (congestive) and diastolic (congestive) heart failure: Secondary | ICD-10-CM | POA: Diagnosis present

## 2016-10-07 DIAGNOSIS — Z8249 Family history of ischemic heart disease and other diseases of the circulatory system: Secondary | ICD-10-CM

## 2016-10-07 DIAGNOSIS — I10 Essential (primary) hypertension: Secondary | ICD-10-CM | POA: Diagnosis not present

## 2016-10-07 DIAGNOSIS — E876 Hypokalemia: Secondary | ICD-10-CM | POA: Diagnosis present

## 2016-10-07 DIAGNOSIS — S3992XA Unspecified injury of lower back, initial encounter: Secondary | ICD-10-CM | POA: Diagnosis not present

## 2016-10-07 DIAGNOSIS — Z7901 Long term (current) use of anticoagulants: Secondary | ICD-10-CM

## 2016-10-07 DIAGNOSIS — I429 Cardiomyopathy, unspecified: Secondary | ICD-10-CM | POA: Diagnosis present

## 2016-10-07 DIAGNOSIS — G2581 Restless legs syndrome: Secondary | ICD-10-CM | POA: Diagnosis not present

## 2016-10-07 DIAGNOSIS — I110 Hypertensive heart disease with heart failure: Secondary | ICD-10-CM | POA: Diagnosis present

## 2016-10-07 DIAGNOSIS — I4891 Unspecified atrial fibrillation: Secondary | ICD-10-CM | POA: Diagnosis not present

## 2016-10-07 DIAGNOSIS — M109 Gout, unspecified: Secondary | ICD-10-CM | POA: Diagnosis present

## 2016-10-07 DIAGNOSIS — N400 Enlarged prostate without lower urinary tract symptoms: Secondary | ICD-10-CM | POA: Diagnosis not present

## 2016-10-07 DIAGNOSIS — K219 Gastro-esophageal reflux disease without esophagitis: Secondary | ICD-10-CM | POA: Diagnosis present

## 2016-10-07 DIAGNOSIS — Z96643 Presence of artificial hip joint, bilateral: Secondary | ICD-10-CM | POA: Diagnosis not present

## 2016-10-07 DIAGNOSIS — S299XXA Unspecified injury of thorax, initial encounter: Secondary | ICD-10-CM | POA: Diagnosis not present

## 2016-10-07 DIAGNOSIS — I482 Chronic atrial fibrillation: Principal | ICD-10-CM | POA: Diagnosis present

## 2016-10-07 DIAGNOSIS — E785 Hyperlipidemia, unspecified: Secondary | ICD-10-CM | POA: Diagnosis present

## 2016-10-07 DIAGNOSIS — G4733 Obstructive sleep apnea (adult) (pediatric): Secondary | ICD-10-CM | POA: Diagnosis not present

## 2016-10-07 DIAGNOSIS — Z881 Allergy status to other antibiotic agents status: Secondary | ICD-10-CM | POA: Diagnosis not present

## 2016-10-07 DIAGNOSIS — Z716 Tobacco abuse counseling: Secondary | ICD-10-CM | POA: Diagnosis not present

## 2016-10-07 DIAGNOSIS — Z885 Allergy status to narcotic agent status: Secondary | ICD-10-CM | POA: Diagnosis not present

## 2016-10-07 DIAGNOSIS — M545 Low back pain: Secondary | ICD-10-CM | POA: Diagnosis not present

## 2016-10-07 DIAGNOSIS — Z888 Allergy status to other drugs, medicaments and biological substances status: Secondary | ICD-10-CM

## 2016-10-07 DIAGNOSIS — M549 Dorsalgia, unspecified: Secondary | ICD-10-CM | POA: Diagnosis not present

## 2016-10-07 DIAGNOSIS — I480 Paroxysmal atrial fibrillation: Secondary | ICD-10-CM | POA: Diagnosis not present

## 2016-10-07 DIAGNOSIS — S22039A Unspecified fracture of third thoracic vertebra, initial encounter for closed fracture: Secondary | ICD-10-CM | POA: Diagnosis not present

## 2016-10-07 DIAGNOSIS — I5033 Acute on chronic diastolic (congestive) heart failure: Secondary | ICD-10-CM | POA: Diagnosis not present

## 2016-10-07 DIAGNOSIS — Z880 Allergy status to penicillin: Secondary | ICD-10-CM

## 2016-10-07 DIAGNOSIS — E669 Obesity, unspecified: Secondary | ICD-10-CM | POA: Diagnosis not present

## 2016-10-07 DIAGNOSIS — F17290 Nicotine dependence, other tobacco product, uncomplicated: Secondary | ICD-10-CM | POA: Diagnosis not present

## 2016-10-07 DIAGNOSIS — S22089A Unspecified fracture of T11-T12 vertebra, initial encounter for closed fracture: Secondary | ICD-10-CM | POA: Diagnosis not present

## 2016-10-07 DIAGNOSIS — Z6832 Body mass index (BMI) 32.0-32.9, adult: Secondary | ICD-10-CM

## 2016-10-07 DIAGNOSIS — I5021 Acute systolic (congestive) heart failure: Secondary | ICD-10-CM | POA: Diagnosis not present

## 2016-10-07 DIAGNOSIS — D72829 Elevated white blood cell count, unspecified: Secondary | ICD-10-CM | POA: Diagnosis not present

## 2016-10-07 LAB — COMPREHENSIVE METABOLIC PANEL
ALT: 20 U/L (ref 17–63)
ANION GAP: 14 (ref 5–15)
AST: 32 U/L (ref 15–41)
Albumin: 3.9 g/dL (ref 3.5–5.0)
Alkaline Phosphatase: 74 U/L (ref 38–126)
BUN: 10 mg/dL (ref 6–20)
CHLORIDE: 103 mmol/L (ref 101–111)
CO2: 23 mmol/L (ref 22–32)
Calcium: 8.6 mg/dL — ABNORMAL LOW (ref 8.9–10.3)
Creatinine, Ser: 0.84 mg/dL (ref 0.61–1.24)
Glucose, Bld: 146 mg/dL — ABNORMAL HIGH (ref 65–99)
Potassium: 3.7 mmol/L (ref 3.5–5.1)
SODIUM: 140 mmol/L (ref 135–145)
Total Bilirubin: 0.7 mg/dL (ref 0.3–1.2)
Total Protein: 7.7 g/dL (ref 6.5–8.1)

## 2016-10-07 LAB — URINALYSIS, COMPLETE (UACMP) WITH MICROSCOPIC
BILIRUBIN URINE: NEGATIVE
Bacteria, UA: NONE SEEN
GLUCOSE, UA: NEGATIVE mg/dL
HGB URINE DIPSTICK: NEGATIVE
KETONES UR: NEGATIVE mg/dL
LEUKOCYTES UA: NEGATIVE
Nitrite: NEGATIVE
PH: 7 (ref 5.0–8.0)
Protein, ur: NEGATIVE mg/dL
RBC / HPF: NONE SEEN RBC/hpf (ref 0–5)
Specific Gravity, Urine: 1.009 (ref 1.005–1.030)

## 2016-10-07 LAB — TROPONIN I
Troponin I: <0.03 ng/mL (ref <0.03)
Troponin I: <0.03 ng/mL (ref <0.03)

## 2016-10-07 LAB — CBC
HCT: 48.9 % (ref 40.0–52.0)
Hemoglobin: 16.8 g/dL (ref 13.0–18.0)
MCH: 34 pg (ref 26.0–34.0)
MCHC: 34.3 g/dL (ref 32.0–36.0)
MCV: 99.1 fL (ref 80.0–100.0)
PLATELETS: 216 10*3/uL (ref 150–440)
RBC: 4.93 MIL/uL (ref 4.40–5.90)
RDW: 14.9 % — AB (ref 11.5–14.5)
WBC: 13.9 10*3/uL — ABNORMAL HIGH (ref 3.8–10.6)

## 2016-10-07 LAB — PROTIME-INR
INR: 1.12
PROTHROMBIN TIME: 14.5 s (ref 11.4–15.2)

## 2016-10-07 LAB — GLUCOSE, CAPILLARY: Glucose-Capillary: 131 mg/dL — ABNORMAL HIGH (ref 65–99)

## 2016-10-07 LAB — TSH: TSH: 1.149 u[IU]/mL (ref 0.350–4.500)

## 2016-10-07 LAB — BRAIN NATRIURETIC PEPTIDE: B NATRIURETIC PEPTIDE 5: 130 pg/mL — AB (ref 0.0–100.0)

## 2016-10-07 LAB — APTT: aPTT: 28 seconds (ref 24–36)

## 2016-10-07 LAB — MRSA PCR SCREENING: MRSA BY PCR: NEGATIVE

## 2016-10-07 MED ORDER — TEMAZEPAM 15 MG PO CAPS
30.0000 mg | ORAL_CAPSULE | Freq: Every evening | ORAL | Status: DC | PRN
  Administered 2016-10-07 – 2016-10-11 (×5): 30 mg via ORAL
  Filled 2016-10-07 (×6): qty 2

## 2016-10-07 MED ORDER — ALLOPURINOL 300 MG PO TABS
300.0000 mg | ORAL_TABLET | Freq: Every day | ORAL | Status: DC
  Administered 2016-10-07 – 2016-10-12 (×6): 300 mg via ORAL
  Filled 2016-10-07 (×6): qty 1

## 2016-10-07 MED ORDER — FUROSEMIDE 40 MG PO TABS
40.0000 mg | ORAL_TABLET | Freq: Every day | ORAL | Status: DC
  Administered 2016-10-08 – 2016-10-09 (×2): 40 mg via ORAL
  Filled 2016-10-07: qty 1
  Filled 2016-10-07: qty 2
  Filled 2016-10-07: qty 1

## 2016-10-07 MED ORDER — AMIODARONE HCL IN DEXTROSE 360-4.14 MG/200ML-% IV SOLN
30.0000 mg/h | INTRAVENOUS | Status: DC
  Filled 2016-10-07: qty 200

## 2016-10-07 MED ORDER — AMIODARONE LOAD VIA INFUSION
150.0000 mg | Freq: Once | INTRAVENOUS | Status: AC
  Administered 2016-10-07: 150 mg via INTRAVENOUS
  Filled 2016-10-07: qty 83.34

## 2016-10-07 MED ORDER — AMIODARONE HCL IN DEXTROSE 360-4.14 MG/200ML-% IV SOLN
30.0000 mg/h | INTRAVENOUS | Status: DC
  Administered 2016-10-07 – 2016-10-08 (×2): 30 mg/h via INTRAVENOUS
  Filled 2016-10-07: qty 200

## 2016-10-07 MED ORDER — IBUPROFEN 100 MG/5ML PO SUSP
200.0000 mg | Freq: Four times a day (QID) | ORAL | Status: DC | PRN
  Administered 2016-10-07: 200 mg via ORAL
  Filled 2016-10-07: qty 10

## 2016-10-07 MED ORDER — IBUPROFEN 100 MG/5ML PO SUSP
200.0000 mg | ORAL | Status: DC | PRN
  Administered 2016-10-07 – 2016-10-08 (×2): 200 mg via ORAL
  Filled 2016-10-07 (×4): qty 10

## 2016-10-07 MED ORDER — POTASSIUM CHLORIDE CRYS ER 20 MEQ PO TBCR
20.0000 meq | EXTENDED_RELEASE_TABLET | Freq: Two times a day (BID) | ORAL | Status: DC
  Administered 2016-10-07 – 2016-10-12 (×10): 20 meq via ORAL
  Filled 2016-10-07 (×11): qty 1

## 2016-10-07 MED ORDER — ACETAMINOPHEN 500 MG PO TABS
1000.0000 mg | ORAL_TABLET | Freq: Once | ORAL | Status: AC
  Administered 2016-10-07: 1000 mg via ORAL
  Filled 2016-10-07: qty 2

## 2016-10-07 MED ORDER — METOPROLOL TARTRATE 5 MG/5ML IV SOLN
2.5000 mg | INTRAVENOUS | Status: DC | PRN
  Administered 2016-10-07 – 2016-10-08 (×2): 5 mg via INTRAVENOUS
  Filled 2016-10-07 (×2): qty 5

## 2016-10-07 MED ORDER — ADULT MULTIVITAMIN W/MINERALS CH
1.0000 | ORAL_TABLET | Freq: Every day | ORAL | Status: DC
  Administered 2016-10-07 – 2016-10-12 (×6): 1 via ORAL
  Filled 2016-10-07 (×6): qty 1

## 2016-10-07 MED ORDER — DOCUSATE SODIUM 100 MG PO CAPS: 100.0000 mg | ORAL_CAPSULE | Freq: Two times a day (BID) | ORAL | Status: DC | PRN

## 2016-10-07 MED ORDER — METOPROLOL TARTRATE 50 MG PO TABS
50.0000 mg | ORAL_TABLET | Freq: Once | ORAL | Status: AC
  Administered 2016-10-07: 50 mg via ORAL
  Filled 2016-10-07: qty 1

## 2016-10-07 MED ORDER — FOLIC ACID 1 MG PO TABS
1.0000 mg | ORAL_TABLET | Freq: Every day | ORAL | Status: DC
  Administered 2016-10-07 – 2016-10-12 (×6): 1 mg via ORAL
  Filled 2016-10-07 (×6): qty 1

## 2016-10-07 MED ORDER — AMIODARONE HCL IN DEXTROSE 360-4.14 MG/200ML-% IV SOLN
60.0000 mg/h | INTRAVENOUS | Status: DC
  Administered 2016-10-07: 60 mg/h via INTRAVENOUS

## 2016-10-07 MED ORDER — METOPROLOL SUCCINATE ER 100 MG PO TB24
100.0000 mg | ORAL_TABLET | Freq: Every day | ORAL | Status: DC
  Administered 2016-10-08 – 2016-10-12 (×5): 100 mg via ORAL
  Filled 2016-10-07: qty 1
  Filled 2016-10-07: qty 2
  Filled 2016-10-07 (×3): qty 1

## 2016-10-07 MED ORDER — IBUPROFEN 100 MG/5ML PO SUSP
ORAL | Status: AC
  Filled 2016-10-07: qty 10

## 2016-10-07 MED ORDER — AMIODARONE HCL IN DEXTROSE 360-4.14 MG/200ML-% IV SOLN
60.0000 mg/h | INTRAVENOUS | Status: DC
  Administered 2016-10-07: 60 mg/h via INTRAVENOUS
  Filled 2016-10-07: qty 200

## 2016-10-07 MED ORDER — VITAMIN B-1 100 MG PO TABS
50.0000 mg | ORAL_TABLET | Freq: Every day | ORAL | Status: DC
  Administered 2016-10-07 – 2016-10-12 (×6): 50 mg via ORAL
  Filled 2016-10-07 (×6): qty 1

## 2016-10-07 MED ORDER — ROPINIROLE HCL 0.25 MG PO TABS
0.5000 mg | ORAL_TABLET | Freq: Three times a day (TID) | ORAL | Status: DC
  Administered 2016-10-07 – 2016-10-12 (×13): 0.5 mg via ORAL
  Filled 2016-10-07: qty 1
  Filled 2016-10-07 (×2): qty 2
  Filled 2016-10-07: qty 1
  Filled 2016-10-07 (×8): qty 2
  Filled 2016-10-07 (×2): qty 1
  Filled 2016-10-07 (×2): qty 2
  Filled 2016-10-07: qty 1
  Filled 2016-10-07: qty 2

## 2016-10-07 MED ORDER — APIXABAN 5 MG PO TABS
5.0000 mg | ORAL_TABLET | Freq: Two times a day (BID) | ORAL | Status: DC
  Administered 2016-10-07 – 2016-10-12 (×10): 5 mg via ORAL
  Filled 2016-10-07 (×11): qty 1

## 2016-10-07 MED ORDER — LISINOPRIL 5 MG PO TABS
5.0000 mg | ORAL_TABLET | Freq: Every day | ORAL | Status: DC
  Administered 2016-10-07 – 2016-10-12 (×6): 5 mg via ORAL
  Filled 2016-10-07 (×6): qty 1

## 2016-10-07 MED ORDER — DILTIAZEM HCL ER COATED BEADS 120 MG PO CP24
360.0000 mg | ORAL_CAPSULE | Freq: Every day | ORAL | Status: DC
  Administered 2016-10-08 – 2016-10-12 (×5): 360 mg via ORAL
  Filled 2016-10-07 (×3): qty 3
  Filled 2016-10-07: qty 1
  Filled 2016-10-07: qty 3

## 2016-10-07 NOTE — Progress Notes (Signed)
Family Meeting Note  Advance Directive:yes  Today a meeting took place with the Patient and his daughter.  The following clinical team members were present during this meeting:MD  The following were discussed:Patient's diagnosis: a fib with RVR, CHF, Hypertension , Patient's progosis: Unable to determine and Goals for treatment: Full Code  Additional follow-up to be provided: cardiology consult.  Time spent during discussion:20 minutes  Brendan Edwards, [name], MD

## 2016-10-07 NOTE — Consult Note (Signed)
[facility]  CARDIOLOGY CONSULT NOTE  Patient ID: Brendan Edwards MRN: [id number] DOB/AGE: September 24, 1945 71 y.o.  Admit date: 10/07/2016 Referring Physician Dr. Anselm Jungling Primary Physician   Primary Cardiologist Dr. Saralyn Pilar Reason for Consultation afib with rvr  HPI: Pt is a 71 yo male with history of chronic afib, moderate nonischemic cardiomyopathy, chronic systolic heart failure, htn, etoh abuse who is now admitted with afib with rvr. He states he is compliat with meds since a recent admission for afib with rvr and etoh withdrawal. He is on eliquis for anticoagulation. He has ruled out for an mi and rate is improved somewhat with iv bolus of amiodarone. He is scheduled to take amiodaroen at home.   Review of Systems  HENT: Negative.   Eyes: Negative.   Respiratory: Positive for shortness of breath.   Cardiovascular: Positive for palpitations and leg swelling.  Gastrointestinal: Negative.   Genitourinary: Negative.   Musculoskeletal: Negative.   Skin: Negative.   Neurological: Positive for weakness.  Endo/Heme/Allergies: Negative.   Psychiatric/Behavioral: Negative.     Past Medical History:  Diagnosis Date  . A-fib ([place])   . Benign prostatic hyperplasia   . Cardiomyopathy, secondary ([place])   . GERD (gastroesophageal reflux disease)   . Gout   . Hyperlipidemia   . Hypertension   . Obesity   . Sleep apnea     Family History  Problem Relation Age of Onset  . Hypertension Mother     Social History   Social History  . Marital status: Widowed    Spouse name: N/A  . Number of children: N/A  . Years of education: N/A   Occupational History  . Not on file.   Social History Main Topics  . Smoking status: Current Every Day Smoker    Types: Cigars  . Smokeless tobacco: Never Used  . Alcohol use Yes  . Drug use: No  . Sexual activity: Not on file   Other Topics Concern  . Not on file   Social History Narrative  .  No narrative on file    Past Surgical History:  Procedure Laterality Date  . APPENDECTOMY    . COLONOSCOPY    . COLONOSCOPY WITH PROPOFOL N/A 11/29/2014   Procedure: COLONOSCOPY WITH PROPOFOL;  Surgeon: Hulen Luster, MD;  Location: [facility] ENDOSCOPY;  Service: Gastroenterology;  Laterality: N/A;  . ESOPHAGOGASTRODUODENOSCOPY    . JOINT REPLACEMENT     right and left hip     Prescriptions Prior to Admission  Medication Sig Dispense Refill Last Dose  . allopurinol (ZYLOPRIM) 300 MG tablet Take 300 mg by mouth daily.   10/06/2016 at Unknown time  . amiodarone (PACERONE) 400 MG tablet Take 1 tablet (400 mg total) by mouth 2 (two) times daily. (Patient taking differently: Take 400 mg by mouth daily. ) 60 tablet 1 10/06/2016 at Unknown time  . apixaban (ELIQUIS) 5 MG TABS tablet Take 5 mg by mouth 2 (two) times daily.   10/06/2016 at Unknown time  . diltiazem (CARDIZEM CD) 360 MG 24 hr capsule Take 1 capsule (360 mg total) by mouth daily. 30 capsule 1 10/06/2016 at Unknown time  . folic acid (FOLVITE) 1 MG tablet Take 1 tablet (1 mg total) by mouth daily. 30 tablet 0 10/06/2016 at Unknown time  . furosemide (LASIX) 40 MG tablet Take 40 mg by mouth daily.   10/06/2016 at Unknown time  . lisinopril (PRINIVIL,ZESTRIL) 5 MG tablet Take 5 mg by  mouth daily.   10/06/2016 at Unknown time  . metoprolol succinate (TOPROL-XL) 100 MG 24 hr tablet Take 100 mg by mouth daily. Take with or immediately following a meal.   10/06/2016 at Unknown time  . Multiple Vitamin (MULTIVITAMIN) tablet Take by mouth daily.   10/06/2016 at Unknown time  . potassium chloride SA (K-DUR,KLOR-CON) 20 MEQ tablet Take 20 mEq by mouth 2 (two) times daily.   10/06/2016 at Unknown time  . rOPINIRole (REQUIP) 0.5 MG tablet Take 0.5 mg by mouth 3 (three) times daily.   10/06/2016 at Unknown time  . temazepam (RESTORIL) 30 MG capsule Take 30 mg by mouth at bedtime as needed for sleep.   10/06/2016 at Unknown time  . thiamine 50 MG tablet Take 1 tablet  (50 mg total) by mouth daily. 30 tablet 0 10/06/2016 at Unknown time  . benzocaine (ORAJEL) 10 % mucosal gel Use as directed in the mouth or throat 4 (four) times daily as needed for mouth pain. 5.3 g 0 prn at prn  . ibuprofen (ADVIL,MOTRIN) 100 MG/5ML suspension Take 200 mg by mouth every 4 (four) hours as needed.   prn at prn    Physical Exam: Blood pressure (!) 171/123, pulse (!) 173, temperature 97.8 F (36.6 C), temperature source Oral, resp. rate 19, weight 91.6 kg (202 lb), SpO2 95 %.   Wt Readings from Last 1 Encounters:  10/07/16 91.6 kg (202 lb)     General appearance: alert and cooperative Resp: clear to auscultation bilaterally Cardio: irregularly irregular rhythm GI: soft, non-tender; bowel sounds normal; no masses,  no organomegaly Extremities: edema 2+ edema Neurologic: Grossly normal  Labs:   Lab Results  Component Value Date   WBC 13.9 (H) 10/07/2016   HGB 16.8 10/07/2016   HCT 48.9 10/07/2016   MCV 99.1 10/07/2016   PLT 216 10/07/2016    Recent Labs Lab 10/07/16 1054  NA 140  K 3.7  CL 103  CO2 23  BUN 10  CREATININE 0.84  CALCIUM 8.6*  PROT 7.7  BILITOT 0.7  ALKPHOS 74  ALT 20  AST 32  GLUCOSE 146*   Lab Results  Component Value Date   CKTOTAL 297 08/10/2016   CKMB 0.8 02/09/2012   TROPONINI <0.03 10/07/2016      Radiology: No acute cardiopulmonary disease EKG: afib with rvr  ASSESSMENT AND PLAN:  Pt iwht history of afib with rvr, etoh abuse and nonischemic cardiomyopathy admitted with recurrent afib with rvr. Hemodynamically stable. Herat rates were I the 150-160 range. Improved with iv amioidarone. Will reload with iv amiodarone and continue with eliquis. Concerned about compliance with meds.  Signed: Teodoro Spray MD, [facility] 10/07/2016, 7:07 PM

## 2016-10-07 NOTE — Plan of Care (Signed)
ICU RN spoke to MD in the ER concerned that amiodarone was turned off at 1630hrs, and pt did not receive iv med, pharmacist notified to contact md to adjust meds per md request, hr in the 170s bp elevated, metoprolol given hr is starting to come down, troponin was drawn late from er, md aware, lab drew sample while in icu, pending mrsa    Arrival Method: Bed accompanied by 1 RNs andcna Mental Orientation: A&O x 4 Telemetry: Pt placed on monitor. Central tele and Elink aware of pt's transfer Assessment: Completed Skin: wnl  IV: flushes easily, no pain, no blood return Pain: no pain  Environmental changes completed to facilitate rest and relaxation.  Safety Measures: Bed alarm obn, 2/4 bed rails up.  Unit Orientation: Pt and family oriented to room, has received patient guide, and taught how to use call bell system.  Family: Family  at bedside with pt.    Intake/Output Summary (Last 24 hours) at 10/07/16 1928 Last data filed at 10/07/16 1918  Gross per 24 hour  Intake           559.33 ml  Output              100 ml  Net           459.33 ml     BP (!) 151/106   Pulse (!) 160   Temp 98.9 F (37.2 C) (Oral)   Resp (!) 22   Ht 5\' 8"  (1.727 m)   Wt 97.8 kg (215 lb 9.8 oz)   SpO2 98%   BMI 32.78 kg/m

## 2016-10-07 NOTE — ED Triage Notes (Signed)
Pt from home via ems with reports that pt tripped and fell around 0400 this am. Denies hitting head, takes Eliquis at home. Pt was found on floor by EMS. Pt had 197 HR SVT, was given 6mg  and 12 mg of adenosine PTA. Per ems pt converted to afib for a moment then went back into SVT. Pt reports pain to back but denies other pain, denies sob.

## 2016-10-07 NOTE — ED Notes (Signed)
This RN called to give report and to clarify the Amiodarone order.

## 2016-10-07 NOTE — ED Notes (Signed)
Pt's troponin draw delayed due to the RN being in a code.

## 2016-10-07 NOTE — Consult Note (Signed)
Name: Brendan Edwards MRN: [id number] DOB: 06-07-1945    ADMISSION DATE:  10/07/2016  CONSULTATION DATE: 10/07/16  REFERRING MD : Dr. Anselm Jungling  CHIEF COMPLAINT: Fall / tachcardia  BRIEF PATIENT DESCRIPTION: 71 year old male with afib with RVR  SIGNIFICANT EVENTS  6/27 Patient admitted to the SDU with Afib RVR, on amiodarone  STUDIES:  none   HISTORY OF PRESENT ILLNESS:  Brendan Edwards is a 71 year old male with known history of afib, BPH,Hyperlipidemia, Hypertension, obesity and sleep apnea. Patient states that he has been feeling dizzy for the past few and is noncompliant with his medication.  He fell at home and was brought to ED on 6/27.  Patient was noted to be in afib with RVR upon arrival to the unit and therefore was started on amiodarone drip.  Cardiologist was consulted and the patient was sent to the SDU  for closer monitoring.  PAST MEDICAL HISTORY :   has a past medical history of A-fib ([place]); Benign prostatic hyperplasia; Cardiomyopathy, secondary ([place]); GERD (gastroesophageal reflux disease); Gout; Hyperlipidemia; Hypertension; Obesity; and Sleep apnea.  has a past surgical history that includes Appendectomy; Joint replacement; Colonoscopy; Esophagogastroduodenoscopy; and Colonoscopy with propofol (N/A, 11/29/2014). Prior to Admission medications   Medication Sig Start Date End Date Taking? Authorizing Provider  allopurinol (ZYLOPRIM) 300 MG tablet Take 300 mg by mouth daily.   Yes [provider]  amiodarone (PACERONE) 400 MG tablet Take 1 tablet (400 mg total) by mouth 2 (two) times daily. Patient taking differently: Take 400 mg by mouth daily.  08/20/16  Yes Fritzi Mandes, MD  apixaban (ELIQUIS) 5 MG TABS tablet Take 5 mg by mouth 2 (two) times daily.   Yes [provider]  diltiazem (CARDIZEM CD) 360 MG 24 hr capsule Take 1 capsule (360 mg total) by mouth daily. 08/20/16  Yes Fritzi Mandes, MD  folic acid (FOLVITE) 1 MG tablet Take 1 tablet (1 mg total) by  mouth daily. 08/20/16  Yes Fritzi Mandes, MD  furosemide (LASIX) 40 MG tablet Take 40 mg by mouth daily.   Yes [provider]  lisinopril (PRINIVIL,ZESTRIL) 5 MG tablet Take 5 mg by mouth daily.   Yes [provider]  metoprolol succinate (TOPROL-XL) 100 MG 24 hr tablet Take 100 mg by mouth daily. Take with or immediately following a meal.   Yes [provider]  Multiple Vitamin (MULTIVITAMIN) tablet Take by mouth daily.   Yes [provider]  potassium chloride SA (K-DUR,KLOR-CON) 20 MEQ tablet Take 20 mEq by mouth 2 (two) times daily.   Yes [provider]  rOPINIRole (REQUIP) 0.5 MG tablet Take 0.5 mg by mouth 3 (three) times daily.   Yes [provider]  temazepam (RESTORIL) 30 MG capsule Take 30 mg by mouth at bedtime as needed for sleep.   Yes [provider]  thiamine 50 MG tablet Take 1 tablet (50 mg total) by mouth daily. 08/20/16  Yes Fritzi Mandes, MD  benzocaine (ORAJEL) 10 % mucosal gel Use as directed in the mouth or throat 4 (four) times daily as needed for mouth pain. 08/20/16   Fritzi Mandes, MD  ibuprofen (ADVIL,MOTRIN) 100 MG/5ML suspension Take 200 mg by mouth every 4 (four) hours as needed.    [provider]   Allergies  Allergen Reactions  . Oxycodone Other (See Comments)    Hallucinations  . Cardura [Doxazosin Mesylate]   . Darvon [Propoxyphene]   . Penicillins   . Requip [Ropinirole Hcl]   .  Septra [Sulfamethoxazole-Trimethoprim]     FAMILY HISTORY:  family history includes Hypertension in his mother. SOCIAL HISTORY:  reports that he has been smoking Cigars.  He has never used smokeless tobacco. He reports that he drinks alcohol. He reports that he does not use drugs.  REVIEW OF SYSTEMS:   Constitutional: Negative for fever, chills, weight loss, malaise/fatigue and diaphoresis.  HENT: Negative for hearing loss, ear pain, nosebleeds, congestion, sore throat, neck pain, tinnitus and ear discharge.     Eyes: Negative for blurred vision, double vision, photophobia, pain, discharge and redness.  Respiratory: Negative for cough, hemoptysis, sputum production, shortness of breath, wheezing and stridor.   Cardiovascular: Negative for chest pain, palpitations, orthopnea, claudication, leg swelling and PND.  Gastrointestinal: Negative for heartburn, nausea, vomiting, abdominal pain, diarrhea, constipation, blood in stool and melena.  Genitourinary: Negative for dysuria, urgency, frequency, hematuria and flank pain.  Musculoskeletal: Negative for myalgias, back pain, joint pain and falls.  Skin: Negative for itching and rash.  Neurological: Negative for dizziness, tingling, tremors, sensory change, speech change, focal weakness, seizures, loss of consciousness, weakness and headaches.  Endo/Heme/Allergies: Negative for environmental allergies and polydipsia. Does not bruise/bleed easily.  SUBJECTIVE: Patient states that "he has been feeling dizzy but is fine currently.  Denies any chest pain, shortness of breath or palpitations at this time."  VITAL SIGNS: Temp:  [97.8 F (36.6 C)-98.9 F (37.2 C)] 98.9 F (37.2 C) (06/27 1915) Pulse Rate:  [160-193] 160 (06/27 1915) Resp:  [19-24] 22 (06/27 1915) BP: (133-173)/(89-137) 151/106 (06/27 1915) SpO2:  [95 %-99 %] 98 % (06/27 1915) Weight:  [202 lb (91.6 kg)-215 lb 9.8 oz (97.8 kg)] 215 lb 9.8 oz (97.8 kg) (06/27 1915)  PHYSICAL EXAMINATION: General:  Elderly gentleman, in no acute distress Neuro:  Awake, alert and oriented HEENT: AT,[place],No JVD,  Cardiovascular:  S1S2,irregularly iregular, no m/r/g noted Lungs: Clear bilaterally, no wheezes,crackles, rhonchi Abdomen: Soft,NT,ND Musculoskeletal:  No edema,cyanosis  Skin:  Warm, dry and intact   Recent Labs Lab 10/07/16 1054  NA 140  K 3.7  CL 103  CO2 23  BUN 10  CREATININE 0.84  GLUCOSE 146*    Recent Labs Lab 10/07/16 1054  HGB 16.8  HCT 48.9  WBC 13.9*  PLT 216   Dg  Lumbar Spine 2-3 Views  Result Date: 10/07/2016 CLINICAL DATA:  Status post fall this morning. Persistent low back pain. EXAM: LUMBAR SPINE - 2-3 VIEW COMPARISON:  Coronal and sagittal reconstructed images from an abdominal and pelvic CT scan dated August 10, 2016 FINDINGS: There is known compression of the body of T12. The loss of height anteriorly is approximately 50% and posteriorly approximately 15%. This has progressed slightly since the previous study. The lumbar vertebral bodies are preserved in height. The disc space heights are reasonably well-maintained. There is facet joint hypertrophy from L3-4 through L5-S1. The pedicles and transverse processes are intact. There is gentle dextrocurvature centered at L3 which is stable. IMPRESSION: No acute lumbar spine abnormality. Slight further compression of the body of T12 as described above. Electronically Signed   By: David  Martinique M.D.   On: 10/07/2016 11:51   Dg Chest Portable 1 View  Result Date: 10/07/2016 CLINICAL DATA:  Fall. EXAM: PORTABLE CHEST 1 VIEW COMPARISON:  08/11/2016 FINDINGS: Low lung volumes. Improving bibasilar opacities from prior study. Heart is borderline in size with mild vascular congestion. No overt edema or visible effusions. No pneumothorax or visible acute bony abnormality. IMPRESSION: Low lung volumes.  Improving bibasilar  atelectasis. Mild vascular congestion. Electronically Signed   By: Rolm Baptise M.D.   On: 10/07/2016 11:26    ASSESSMENT / PLAN:  Afib with RVR Hypertension Tobacco abuse Leukocytocis  Amiodarone gtt Metoprolol PRN to keep HR<115 Continue Apixaban Continue metoprolol/diltiazem Cardiology following the patient TSH WNL Tobacco cessation councelling   Bincy Varughese,AG-ACNP Pulmonary and [place]   10/07/2016, 7:54 PM

## 2016-10-07 NOTE — Progress Notes (Addendum)
  Amiodarone Drug - Drug Interaction Consult Note  Recommendations: No significant drug interactions with amiodarone at this time. See below for potential interactions.    Amiodarone is metabolized by the cytochrome P450 system and therefore has the potential to cause many drug interactions. Amiodarone has an average plasma half-life of 50 days (range 20 to 100 days).   There is potential for drug interactions to occur several weeks or months after stopping treatment and the onset of drug interactions may be slow after initiating amiodarone.   []  Statins: Increased risk of myopathy. Simvastatin- restrict dose to 20mg  daily. Other statins: counsel patients to report any muscle pain or weakness immediately.  []  Anticoagulants: Amiodarone can increase anticoagulant effect. Consider warfarin dose reduction. Patients should be monitored closely and the dose of anticoagulant altered accordingly, remembering that amiodarone levels take several weeks to stabilize.  []  Antiepileptics: Amiodarone can increase plasma concentration of phenytoin, the dose should be reduced. Note that small changes in phenytoin dose can result in large changes in levels. Monitor patient and counsel on signs of toxicity.  [x]  Beta blockers: increased risk of bradycardia, AV block and myocardial depression. Sotalol - avoid concomitant use.  [x]   Calcium channel blockers (diltiazem and verapamil): increased risk of bradycardia, AV block and myocardial depression.  []   Cyclosporine: Amiodarone increases levels of cyclosporine. Reduced dose of cyclosporine is recommended.  []  Digoxin dose should be halved when amiodarone is started.  [x]  Diuretics: increased risk of cardiotoxicity if hypokalemia occurs.  []  Oral hypoglycemic agents (glyburide, glipizide, glimepiride): increased risk of hypoglycemia. Patient's glucose levels should be monitored closely when initiating amiodarone therapy.   []  Drugs that prolong the QT  interval:  Torsades de pointes risk may be increased with concurrent use - avoid if possible.  Monitor QTc, also keep magnesium/potassium WNL if concurrent therapy can't be avoided. Marland Kitchen Antibiotics: e.g. fluoroquinolones, erythromycin. . Antiarrhythmics: e.g. quinidine, procainamide, disopyramide, sotalol. . Antipsychotics: e.g. phenothiazines, haloperidol.  . Lithium, tricyclic antidepressants, and methadone. Thank You,  Napoleon Form  10/07/2016 7:46 PM

## 2016-10-07 NOTE — ED Notes (Signed)
This nurse called report to receiving nurse Caryl Pina regarding expired amiodarone drip at 33.3 ML/hr . Discussed need for additional bag and clarification of order. Notified Primary nurse Maudie Mercury and paged admitting Dr. Damaris Schooner with admitting MD regarding change/issue of order ; MD to put in new order. Pharmacy was notified by Maudie Mercury , primary nurse, for further clarification on linked order

## 2016-10-07 NOTE — ED Notes (Signed)
Admitting MD at bedside.

## 2016-10-07 NOTE — H&P (Signed)
[facility] [place] NAME: Brendan Edwards    MR#:  [id number]  DATE OF BIRTH:  [birth year]  DATE OF ADMISSION:  10/07/2016  PRIMARY CARE PHYSICIAN: Tracie Harrier, MD   REQUESTING/REFERRING PHYSICIAN: [email or web address]  CHIEF COMPLAINT:   Chief Complaint  Patient presents with  . Fall  . Tachycardia    HISTORY OF PRESENT ILLNESS: Brendan Edwards  is a 71 y.o. male with a known history of A. fib, hyperlipidemia, hypertension, sleep apnea, benign prostatic hypertrophy, cardiomyopathy- for last few days has complaints of dizziness. He denies any chest pain or shortness of breath or edema on the legs. Patient claims to be compliant with his medications but his girlfriend who is present in the room 7 days she found many times he is not taking his Lasix regularly and his leg swells up and then he started taking it for a few days. So she questions his compliance about medications. He had a fall at home and brought to emergency room, x-ray on the back is negative for any acute fractures but he is noted to be in A. fib with rapid ventricular response in ER physician discussed with his cardiologist who suggested to start him on amiodarone drip.  PAST MEDICAL HISTORY:   Past Medical History:  Diagnosis Date  . A-fib ([place])   . Benign prostatic hyperplasia   . Cardiomyopathy, secondary ([place])   . GERD (gastroesophageal reflux disease)   . Gout   . Hyperlipidemia   . Hypertension   . Obesity   . Sleep apnea     PAST SURGICAL HISTORY: Past Surgical History:  Procedure Laterality Date  . APPENDECTOMY    . COLONOSCOPY    . COLONOSCOPY WITH PROPOFOL N/A 11/29/2014   Procedure: COLONOSCOPY WITH PROPOFOL;  Surgeon: Hulen Luster, MD;  Location: [facility] ENDOSCOPY;  Service: Gastroenterology;  Laterality: N/A;  . ESOPHAGOGASTRODUODENOSCOPY    . JOINT REPLACEMENT     right and left hip    SOCIAL HISTORY:  Social History  Substance Use Topics  . Smoking status: Current  Every Day Smoker    Types: Cigars  . Smokeless tobacco: Never Used  . Alcohol use Yes    FAMILY HISTORY:  Family History  Problem Relation Age of Onset  . Hypertension Mother     DRUG ALLERGIES:  Allergies  Allergen Reactions  . Oxycodone Other (See Comments)    Hallucinations  . Cardura [Doxazosin Mesylate]   . Darvon [Propoxyphene]   . Penicillins   . Requip [Ropinirole Hcl]   . Septra [Sulfamethoxazole-Trimethoprim]     REVIEW OF SYSTEMS:   CONSTITUTIONAL: No fever, fatigue or weakness.  EYES: No blurred or double vision.  EARS, NOSE, AND THROAT: No tinnitus or ear pain.  RESPIRATORY: No cough, shortness of breath, wheezing or hemoptysis.  CARDIOVASCULAR: No chest pain,positive for orthopnea, edema.  GASTROINTESTINAL: No nausea, vomiting, diarrhea or abdominal pain.  GENITOURINARY: No dysuria, hematuria.  ENDOCRINE: No polyuria, nocturia,  HEMATOLOGY: No anemia, easy bruising or bleeding SKIN: No rash or lesion. MUSCULOSKELETAL: No joint pain or arthritis.   NEUROLOGIC: No tingling, numbness, weakness.  PSYCHIATRY: No anxiety or depression.   MEDICATIONS AT HOME:  Prior to Admission medications   Medication Sig Start Date End Date Taking? Authorizing Provider  allopurinol (ZYLOPRIM) 300 MG tablet Take 300 mg by mouth daily.   Yes [provider]  amiodarone (PACERONE) 400 MG tablet Take 1 tablet (400 mg total) by mouth 2 (two) times daily. Patient  taking differently: Take 400 mg by mouth daily.  08/20/16  Yes Fritzi Mandes, MD  apixaban (ELIQUIS) 5 MG TABS tablet Take 5 mg by mouth 2 (two) times daily.   Yes [provider]  diltiazem (CARDIZEM CD) 360 MG 24 hr capsule Take 1 capsule (360 mg total) by mouth daily. 08/20/16  Yes Fritzi Mandes, MD  folic acid (FOLVITE) 1 MG tablet Take 1 tablet (1 mg total) by mouth daily. 08/20/16  Yes Fritzi Mandes, MD  furosemide (LASIX) 40 MG tablet Take 40 mg by mouth daily.   Yes [provider]  lisinopril  (PRINIVIL,ZESTRIL) 5 MG tablet Take 5 mg by mouth daily.   Yes [provider]  metoprolol succinate (TOPROL-XL) 100 MG 24 hr tablet Take 100 mg by mouth daily. Take with or immediately following a meal.   Yes [provider]  Multiple Vitamin (MULTIVITAMIN) tablet Take by mouth daily.   Yes [provider]  potassium chloride SA (K-DUR,KLOR-CON) 20 MEQ tablet Take 20 mEq by mouth 2 (two) times daily.   Yes [provider]  rOPINIRole (REQUIP) 0.5 MG tablet Take 0.5 mg by mouth 3 (three) times daily.   Yes [provider]  temazepam (RESTORIL) 30 MG capsule Take 30 mg by mouth at bedtime as needed for sleep.   Yes [provider]  thiamine 50 MG tablet Take 1 tablet (50 mg total) by mouth daily. 08/20/16  Yes Fritzi Mandes, MD  benzocaine (ORAJEL) 10 % mucosal gel Use as directed in the mouth or throat 4 (four) times daily as needed for mouth pain. 08/20/16   Fritzi Mandes, MD  ibuprofen (ADVIL,MOTRIN) 100 MG/5ML suspension Take 200 mg by mouth every 4 (four) hours as needed.    [provider]      PHYSICAL EXAMINATION:   VITAL SIGNS: Blood pressure (!) 158/113, pulse (!) 193, temperature 97.8 F (36.6 C), temperature source Oral, resp. rate (!) 23, weight 91.6 kg (202 lb), SpO2 99 %.  GENERAL:  71 y.o.-year-old patient lying in the bed with no acute distress.  EYES: Pupils equal, round, reactive to light and accommodation. No scleral icterus. Extraocular muscles intact.  HEENT: Head atraumatic, normocephalic. Oropharynx and nasopharynx clear.  NECK:  Supple, no jugular venous distention. No thyroid enlargement, no tenderness.  LUNGS: Normal breath sounds bilaterally, no wheezing, rales,rhonchi or crepitation. No use of accessory muscles of respiration.  CARDIOVASCULAR: S1, S2 fast. No murmurs, rubs, or gallops.  ABDOMEN: Soft, nontender, nondistended. Bowel sounds present. No organomegaly or mass.  EXTREMITIES: some pedal edema,no  cyanosis, or clubbing.  NEUROLOGIC: Cranial nerves II through XII are intact. Muscle strength 5/5 in all extremities. Sensation intact. Gait not checked.  PSYCHIATRIC: The patient is alert and oriented x 3.  SKIN: No obvious rash, lesion, or ulcer.   LABORATORY PANEL:   CBC  Recent Labs Lab 10/07/16 1054  WBC 13.9*  HGB 16.8  HCT 48.9  PLT 216  MCV 99.1  MCH 34.0  MCHC 34.3  RDW 14.9*   ------------------------------------------------------------------------------------------------------------------  Chemistries   Recent Labs Lab 10/07/16 1054  NA 140  K 3.7  CL 103  CO2 23  GLUCOSE 146*  BUN 10  CREATININE 0.84  CALCIUM 8.6*  AST 32  ALT 20  ALKPHOS 74  BILITOT 0.7   ------------------------------------------------------------------------------------------------------------------ estimated creatinine clearance is 87.4 mL/min (by C-G formula based on SCr of 0.84 mg/dL). ------------------------------------------------------------------------------------------------------------------  Recent Labs  10/07/16 1054  TSH 1.149     Coagulation profile  Recent Labs Lab 10/07/16 1054  INR 1.12   ------------------------------------------------------------------------------------------------------------------- No results for input(s): DDIMER in the last 72 hours. -------------------------------------------------------------------------------------------------------------------  Cardiac Enzymes  Recent Labs Lab 10/07/16 1054  TROPONINI <0.03   ------------------------------------------------------------------------------------------------------------------ Invalid input(s): POCBNP  ---------------------------------------------------------------------------------------------------------------  Urinalysis    Component Value Date/Time   COLORURINE YELLOW (A) 08/15/2016 0945   APPEARANCEUR CLEAR (A) 08/15/2016 0945   LABSPEC 1.010 08/15/2016 0945    PHURINE 6.0 08/15/2016 0945   GLUCOSEU NEGATIVE 08/15/2016 0945   HGBUR NEGATIVE 08/15/2016 0945   BILIRUBINUR NEGATIVE 08/15/2016 0945   KETONESUR NEGATIVE 08/15/2016 0945   PROTEINUR NEGATIVE 08/15/2016 0945   NITRITE NEGATIVE 08/15/2016 0945   LEUKOCYTESUR NEGATIVE 08/15/2016 0945     RADIOLOGY: Dg Lumbar Spine 2-3 Views  Result Date: 10/07/2016 CLINICAL DATA:  Status post fall this morning. Persistent low back pain. EXAM: LUMBAR SPINE - 2-3 VIEW COMPARISON:  Coronal and sagittal reconstructed images from an abdominal and pelvic CT scan dated August 10, 2016 FINDINGS: There is known compression of the body of T12. The loss of height anteriorly is approximately 50% and posteriorly approximately 15%. This has progressed slightly since the previous study. The lumbar vertebral bodies are preserved in height. The disc space heights are reasonably well-maintained. There is facet joint hypertrophy from L3-4 through L5-S1. The pedicles and transverse processes are intact. There is gentle dextrocurvature centered at L3 which is stable. IMPRESSION: No acute lumbar spine abnormality. Slight further compression of the body of T12 as described above. Electronically Signed   By: David  Martinique M.D.   On: 10/07/2016 11:51   Dg Chest Portable 1 View  Result Date: 10/07/2016 CLINICAL DATA:  Fall. EXAM: PORTABLE CHEST 1 VIEW COMPARISON:  08/11/2016 FINDINGS: Low lung volumes. Improving bibasilar opacities from prior study. Heart is borderline in size with mild vascular congestion. No overt edema or visible effusions. No pneumothorax or visible acute bony abnormality. IMPRESSION: Low lung volumes.  Improving bibasilar atelectasis. Mild vascular congestion. Electronically Signed   By: Rolm Baptise M.D.   On: 10/07/2016 11:26    EKG: Orders placed or performed during the hospital encounter of 10/07/16  . ED EKG within 10 minutes  . ED EKG within 10 minutes    IMPRESSION AND PLAN:  * Atrial fibrillation with  rapid ventricular response   Cardiovascular he suggested a neurology.   Check TSH and urinalysis.   Monitor on telemetry and follow serial troponin. Get echocardiogram.   Cardiology consult and continue his metoprolol.   Continue anticoagulation.  * Hypertension   Continue metoprolol, lisinopril.  * Smoking   Counseled to quit smoking for 4 minutes and offered nicotine patch.  * Elevated WBCs   Get urinalysis to rule out infection.  * History of alcohol abuse   This time patient and his girlfriend denies alcohol use in the last 1 month, during last admission he had alcohol withdrawal.   All the records are reviewed and case discussed with ED provider. Management plans discussed with the patient, family and they are in agreement.  CODE STATUS:Full code  Code Status History    Date Active Date Inactive Code Status Order ID Comments User Context   08/10/2016  8:17 PM 08/20/2016  3:17 PM Full Code [id number]  Theodoro Grist, MD ED     Patient's daughter and girlfriend are present in the emergency.  TOTAL TIME TAKING CARE OF THIS PATIENT: 50 critical care  minutes.    Vaughan Basta M.D on 10/07/2016   Between 7am to 6pm -  Pager - [phone]  After 6pm go to [email or web address] - password [email or web address] [place] [facility]  Office  [phone]  CC: Primary care physician; Tracie Harrier, MD   Note: This dictation was prepared with Dragon dictation along with smaller phrase technology. Any transcriptional errors that result from this process are unintentional.

## 2016-10-07 NOTE — ED Provider Notes (Signed)
[facility] Emergency Department Provider Note   ____________________________________________    I have reviewed the triage vital signs and the nursing notes.   HISTORY  Chief Complaint Fall and Tachycardia     HPI Brendan Edwards is a 71 y.o. male with a history of atrial fibrillation on eliquis, amiodarone, Cardizem and metoprolol who presents with weakness and dizziness and elevated heart rate. He also reports a fall where he fell onto his backside. He complains of mildly worse chronic back pain. No head injury. No neck pain. No abdominal pain. No chest pain reported. He reports compliance with his medications. No fevers or chills or cough.   Past Medical History:  Diagnosis Date  . A-fib ([place])   . Benign prostatic hyperplasia   . Cardiomyopathy, secondary ([place])   . GERD (gastroesophageal reflux disease)   . Gout   . Hyperlipidemia   . Hypertension   . Obesity   . Sleep apnea     Patient Active Problem List   Diagnosis Date Noted  . Atrial fibrillation with RVR ([place]) 08/10/2016  . Diabetes ([place]) 08/10/2016  . Leukocytosis 08/10/2016  . Fall 08/10/2016  . Alcoholic hepatitis 44/81/8563  . Alcohol withdrawal ([place]) 08/10/2016  . Tobacco abuse counseling 08/10/2016    Past Surgical History:  Procedure Laterality Date  . APPENDECTOMY    . COLONOSCOPY    . COLONOSCOPY WITH PROPOFOL N/A 11/29/2014   Procedure: COLONOSCOPY WITH PROPOFOL;  Surgeon: Hulen Luster, MD;  Location: [facility] ENDOSCOPY;  Service: Gastroenterology;  Laterality: N/A;  . ESOPHAGOGASTRODUODENOSCOPY    . JOINT REPLACEMENT     right and left hip    Prior to Admission medications   Medication Sig Start Date End Date Taking? Authorizing Provider  allopurinol (ZYLOPRIM) 300 MG tablet Take 300 mg by mouth daily.   Yes [provider]  amiodarone (PACERONE) 400 MG tablet Take 1 tablet (400 mg total) by mouth 2 (two) times daily. Patient taking differently: Take 400 mg  by mouth daily.  08/20/16  Yes Fritzi Mandes, MD  apixaban (ELIQUIS) 5 MG TABS tablet Take 5 mg by mouth 2 (two) times daily.   Yes [provider]  diltiazem (CARDIZEM CD) 360 MG 24 hr capsule Take 1 capsule (360 mg total) by mouth daily. 08/20/16  Yes Fritzi Mandes, MD  folic acid (FOLVITE) 1 MG tablet Take 1 tablet (1 mg total) by mouth daily. 08/20/16  Yes Fritzi Mandes, MD  furosemide (LASIX) 40 MG tablet Take 40 mg by mouth daily.   Yes [provider]  lisinopril (PRINIVIL,ZESTRIL) 5 MG tablet Take 5 mg by mouth daily.   Yes [provider]  metoprolol succinate (TOPROL-XL) 100 MG 24 hr tablet Take 100 mg by mouth daily. Take with or immediately following a meal.   Yes [provider]  Multiple Vitamin (MULTIVITAMIN) tablet Take by mouth daily.   Yes [provider]  potassium chloride SA (K-DUR,KLOR-CON) 20 MEQ tablet Take 20 mEq by mouth 2 (two) times daily.   Yes [provider]  rOPINIRole (REQUIP) 0.5 MG tablet Take 0.5 mg by mouth 3 (three) times daily.   Yes [provider]  temazepam (RESTORIL) 30 MG capsule Take 30 mg by mouth at bedtime as needed for sleep.   Yes [provider]  thiamine 50 MG tablet Take 1 tablet (50 mg total) by mouth daily. 08/20/16  Yes Fritzi Mandes, MD  benzocaine (ORAJEL) 10 % mucosal gel Use as directed in  the mouth or throat 4 (four) times daily as needed for mouth pain. 08/20/16   Fritzi Mandes, MD  ibuprofen (ADVIL,MOTRIN) 100 MG/5ML suspension Take 200 mg by mouth every 4 (four) hours as needed.    [provider]     Allergies Oxycodone; Cardura [doxazosin mesylate]; Darvon [propoxyphene]; Penicillins; Requip [ropinirole hcl]; and Septra [sulfamethoxazole-trimethoprim]  No family history on file.  Social History Social History  Substance Use Topics  . Smoking status: Current Every Day Smoker    Types: Cigars  . Smokeless tobacco: Never Used  . Alcohol use Yes    Review of  Systems  Constitutional: No fever/chills Eyes: No visual changes.  ENT: No sore throat. Cardiovascular: Denies chest pain.Positive palpitations Respiratory: Denies shortness of breath. Gastrointestinal: No abdominal pain.  No nausea, no vomiting.   Genitourinary: Negative for dysuria. Musculoskeletal: Negative for back pain. Skin: Negative for rash. Neurological: Negative for headaches    ____________________________________________   PHYSICAL EXAM:  VITAL SIGNS: ED Triage Vitals  Enc Vitals Group     BP 10/07/16 1055 133/89     Pulse Rate 10/07/16 1055 (!) 183     Resp 10/07/16 1055 (!) 24     Temp 10/07/16 1055 97.8 F (36.6 C)     Temp Source 10/07/16 1055 Oral     SpO2 10/07/16 1055 95 %     Weight 10/07/16 1049 91.6 kg (202 lb)     Height --      Head Circumference --      Peak Flow --      Pain Score 10/07/16 1049 7     Pain Loc --      Pain Edu? --      Excl. in [place]? --     Constitutional: Alert and oriented.  Pleasant and interactive Eyes: Conjunctivae are normal.  Head: Atraumatic. Nose: No congestion/rhinnorhea. Mouth/Throat: Mucous membranes are Dry  Neck:  Painless ROM, no vertebral tenderness to palpation Cardiovascular: Tachycardia, irregularly irregular rhythm. Grossly normal heart sounds.  Good peripheral circulation. Respiratory: Normal respiratory effort.  No retractions. Lungs CTAB. Gastrointestinal: Soft and nontender. No distention.  No CVA tenderness. Genitourinary: deferred Musculoskeletal: No lower extremity tenderness nor edema.  Warm and well perfused Neurologic:  Normal speech and language. No gross focal neurologic deficits are appreciated.  Skin:  Skin is warm, dry and intact. No rash noted. Psychiatric: Mood and affect are normal. Speech and behavior are normal.  ____________________________________________   LABS (all labs ordered are listed, but only abnormal results are displayed)  Labs Reviewed  CBC - Abnormal; Notable for  the following:       Result Value   WBC 13.9 (*)    RDW 14.9 (*)    All other components within normal limits  COMPREHENSIVE METABOLIC PANEL - Abnormal; Notable for the following:    Glucose, Bld 146 (*)    Calcium 8.6 (*)    All other components within normal limits  BRAIN NATRIURETIC PEPTIDE - Abnormal; Notable for the following:    B Natriuretic Peptide 130.0 (*)    All other components within normal limits  TROPONIN I  PROTIME-INR  APTT   ____________________________________________  EKG  ED ECG REPORT I, Lavonia Drafts, the attending physician, personally viewed and interpreted this ECG.  Date: 10/07/2016  Rhythm: Atrophic relation QRS Axis: normal Intervals: normal ST/T Wave abnormalities: Nonspecific Narrative Interpretation: A. fib with RVR _______________________________________  RADIOLOGY  Chest x-ray unremarkable ____________________________________________   PROCEDURES  Procedure(s) performed: No  Critical Care performed:yes  CRITICAL CARE Performed by: Lavonia Drafts   Total critical care time:30 minutes  Critical care time was exclusive of separately billable procedures and treating other patients.  Critical care was necessary to treat or prevent imminent or life-threatening deterioration.  Critical care was time spent personally by me on the following activities: development of treatment plan with patient and/or surrogate as well as nursing, discussions with consultants, evaluation of patient's response to treatment, examination of patient, obtaining history from patient or surrogate, ordering and performing treatments and interventions, ordering and review of laboratory studies, ordering and review of radiographic studies, pulse oximetry and re-evaluation of patient's condition.  ____________________________________________   INITIAL IMPRESSION / ASSESSMENT AND PLAN / ED COURSE  Pertinent labs & imaging results that were available  during my care of the patient were reviewed by me and considered in my medical decision making (see chart for details).  Patient presents with atrial fibrillation with rapid ventricular response, weakness mildly diaphoretic. On triple therapy for atrial fibrillation and on eliquis. No evidence of head injury. Discussed with Dr. Ubaldo Glassing of  cardiology recommends amiodarone infusion and drip.  Overall lab work is reassuring, possibly worsened compression fracture of T12. Admitted to the hospitalist service for further management    ____________________________________________   FINAL CLINICAL IMPRESSION(S) / ED DIAGNOSES  Final diagnoses:  Atrial fibrillation with RVR ([place])      NEW MEDICATIONS STARTED DURING THIS VISIT:  New Prescriptions   No medications on file     Note:  This document was prepared using Dragon voice recognition software and may include unintentional dictation errors.    Lavonia Drafts, MD 10/07/16 1324

## 2016-10-08 ENCOUNTER — Inpatient Hospital Stay
Admit: 2016-10-08 | Discharge: 2016-10-08 | Disposition: A | Discharge status: Home / Self Care | Payer: PPO | Attending: Internal Medicine | Admitting: Internal Medicine

## 2016-10-08 DIAGNOSIS — I4891 Unspecified atrial fibrillation: Secondary | ICD-10-CM

## 2016-10-08 LAB — URINE DRUG SCREEN, QUALITATIVE (ARMC ONLY)
AMPHETAMINES, UR SCREEN: NOT DETECTED
BARBITURATES, UR SCREEN: NOT DETECTED
BENZODIAZEPINE, UR SCRN: POSITIVE — AB
CANNABINOID 50 NG, UR ~~LOC~~: NOT DETECTED
Cocaine Metabolite,Ur ~~LOC~~: NOT DETECTED
MDMA (Ecstasy)Ur Screen: NOT DETECTED
METHADONE SCREEN, URINE: NOT DETECTED
Opiate, Ur Screen: NOT DETECTED
PHENCYCLIDINE (PCP) UR S: NOT DETECTED
Tricyclic, Ur Screen: NOT DETECTED

## 2016-10-08 LAB — BASIC METABOLIC PANEL
Anion gap: 8 (ref 5–15)
BUN: 8 mg/dL (ref 6–20)
CHLORIDE: 101 mmol/L (ref 101–111)
CO2: 30 mmol/L (ref 22–32)
Calcium: 8.4 mg/dL — ABNORMAL LOW (ref 8.9–10.3)
Creatinine, Ser: 0.74 mg/dL (ref 0.61–1.24)
GFR calc non Af Amer: >60 mL/min (ref >60)
Glucose, Bld: 131 mg/dL — ABNORMAL HIGH (ref 65–99)
Potassium: 3.3 mmol/L — ABNORMAL LOW (ref 3.5–5.1)
SODIUM: 139 mmol/L (ref 135–145)

## 2016-10-08 LAB — CBC
HCT: 46.1 % (ref 40.0–52.0)
Hemoglobin: 15.8 g/dL (ref 13.0–18.0)
MCH: 33.6 pg (ref 26.0–34.0)
MCHC: 34.3 g/dL (ref 32.0–36.0)
MCV: 98 fL (ref 80.0–100.0)
PLATELETS: 188 10*3/uL (ref 150–440)
RBC: 4.71 MIL/uL (ref 4.40–5.90)
RDW: 14.4 % (ref 11.5–14.5)
WBC: 10 10*3/uL (ref 3.8–10.6)

## 2016-10-08 LAB — TROPONIN I: Troponin I: <0.03 ng/mL (ref <0.03)

## 2016-10-08 LAB — ECHOCARDIOGRAM COMPLETE
Height: 68 in
WEIGHTICAEL: 3449.76 [oz_av]

## 2016-10-08 LAB — MAGNESIUM: Magnesium: 1.4 mg/dL — ABNORMAL LOW (ref 1.7–2.4)

## 2016-10-08 MED ORDER — POTASSIUM CHLORIDE CRYS ER 20 MEQ PO TBCR
40.0000 meq | EXTENDED_RELEASE_TABLET | Freq: Once | ORAL | Status: AC
  Administered 2016-10-08: 40 meq via ORAL
  Filled 2016-10-08: qty 2

## 2016-10-08 MED ORDER — TRAMADOL HCL 50 MG PO TABS
50.0000 mg | ORAL_TABLET | Freq: Four times a day (QID) | ORAL | Status: DC | PRN
  Administered 2016-10-08 – 2016-10-09 (×3): 50 mg via ORAL
  Filled 2016-10-08 (×3): qty 1

## 2016-10-08 MED ORDER — AMIODARONE HCL 200 MG PO TABS
400.0000 mg | ORAL_TABLET | Freq: Two times a day (BID) | ORAL | Status: DC
  Administered 2016-10-08 – 2016-10-09 (×3): 400 mg via ORAL
  Filled 2016-10-08 (×3): qty 2

## 2016-10-08 MED ORDER — MAGNESIUM SULFATE 4 GM/100ML IV SOLN
4.0000 g | Freq: Once | INTRAVENOUS | Status: AC
  Administered 2016-10-08: 4 g via INTRAVENOUS
  Filled 2016-10-08: qty 100

## 2016-10-08 NOTE — Progress Notes (Signed)
*  PRELIMINARY RESULTS* Echocardiogram 2D Echocardiogram has been performed.  Sherrie Sport 10/08/2016, 10:47 AM

## 2016-10-08 NOTE — Progress Notes (Signed)
Pharmacy Consult for Electrolyte Monitoring Indication: Hypokalemia and Hypomagnesemia  Allergies  Allergen Reactions  . Oxycodone Other (See Comments)    Hallucinations  . Cardura [Doxazosin Mesylate]   . Darvon [Propoxyphene]   . Penicillins   . Requip [Ropinirole Hcl]   . Septra [Sulfamethoxazole-Trimethoprim]     Patient Measurements: Height: 5\' 8"  (172.7 cm) Weight: 215 lb 9.8 oz (97.8 kg) IBW/kg (Calculated) : 68.4  Vital Signs: Temp: 98.6 F (37 C) (06/28 0800) Temp Source: Oral (06/28 0800) BP: 146/83 (06/28 1100) Pulse Rate: 78 (06/28 1200) Intake/Output from previous day: 06/27 0701 - 06/28 0700 In: 773.5 [P.O.:240; I.V.:533.5] Out: 475 [Urine:475] Intake/Output from this shift: Total I/O In: 83.5 [I.V.:83.5] Out: 150 [Urine:150]  Labs:  Recent Labs  10/07/16 1054 10/08/16 0430  WBC 13.9* 10.0  HGB 16.8 15.8  HCT 48.9 46.1  PLT 216 188  APTT 28  --   CREATININE 0.84 0.74  MG  --  1.4*  ALBUMIN 3.9  --   PROT 7.7  --   AST 32  --   ALT 20  --   ALKPHOS 74  --   BILITOT 0.7  --    Estimated Creatinine Clearance: 94.7 mL/min (by C-G formula based on SCr of 0.74 mg/dL).  Potassium (mmol/L)  Date Value  10/08/2016 3.3 (L)  02/09/2012 3.4 (L)   Calcium (mg/dL)  Date Value  10/08/2016 8.4 (L)   Calcium, Total (mg/dL)  Date Value  02/09/2012 8.8   Sodium (mmol/L)  Date Value  10/08/2016 139  02/09/2012 141     Medical History: Past Medical History:  Diagnosis Date  . A-fib ([place])   . Benign prostatic hyperplasia   . Cardiomyopathy, secondary ([place])   . GERD (gastroesophageal reflux disease)   . Gout   . Hyperlipidemia   . Hypertension   . Obesity   . Sleep apnea     Assessment: 71 y/o M with a h/o atrial fibrillation, congestive heart failure, and alcohol abuse admitted with atrial fibrillation with rapid ventricular response. Patient is on scheduled Lasix and oral potassium supplementation.   Plan:  Will order magnesium  sulfate 4 g iv once and KCl 40 meq po once (in addition to scheduled KCl) and f/u AM labs.   Ulice Dash D 10/08/2016,12:35 PM

## 2016-10-08 NOTE — Progress Notes (Signed)
Took over care 16:00 from [facility]. Patient alert. Up to chair with physical therapy. Tolerating diet, used urinal. Has had some complaints of back pain. Medication being administered. Report called to [place].

## 2016-10-08 NOTE — Progress Notes (Signed)
Brendan Edwards at [place] NAME: Brendan Edwards    MR#:  [id number]  DATE OF BIRTH:  [birth year]  SUBJECTIVE:  CHIEF COMPLAINT:   Chief Complaint  Patient presents with  . Fall  . Tachycardia  The patient is 71 year old Caucasian male with past medical history significant for history of chronic atrial fibrillation, hyperlipidemia, hypertension, sleep apnea, BPH, [place], myopathy, who presents to the hospital with complaints of 3-4 day history of dizziness, lower extremity swelling. He fell down at home and was brought to emergency room for further evaluation, since was complaining of back pain. In emergency room, he was noted to be in A. fib, RVR and was admitted. He was initiated on an amiodarone drip, now converted to sinus rhythm. He feels good, however, admits of low back pain.  Review of Systems  Constitutional: Negative for chills, fever and weight loss.  HENT: Negative for congestion.   Eyes: Negative for blurred vision and double vision.  Respiratory: Positive for shortness of breath. Negative for cough, sputum production and wheezing.   Cardiovascular: Positive for orthopnea and leg swelling. Negative for chest pain, palpitations and PND.  Gastrointestinal: Negative for abdominal pain, blood in stool, constipation, diarrhea, nausea and vomiting.  Genitourinary: Negative for dysuria, frequency, hematuria and urgency.  Musculoskeletal: Positive for back pain and falls.  Neurological: Negative for dizziness, tremors, focal weakness and headaches.  Endo/Heme/Allergies: Does not bruise/bleed easily.  Psychiatric/Behavioral: Negative for depression. The patient does not have insomnia.     VITAL SIGNS: Blood pressure 124/81, pulse 60, temperature 98.6 F (37 C), temperature source Oral, resp. rate 19, height 5\' 8"  (1.727 m), weight 97.8 kg (215 lb 9.8 oz), SpO2 97 %.  PHYSICAL EXAMINATION:   GENERAL:  71 y.o.-year-old patient lying in  the bed in mild to moderate respiratory distress., Also very uncomfortable due to pain, especially with movements in the bed  EYES: Pupils equal, round, reactive to light and accommodation. No scleral icterus. Extraocular muscles intact.  HEENT: Head atraumatic, normocephalic. Oropharynx and nasopharynx clear.  NECK:  Supple, no jugular venous distention. No thyroid enlargement, no tenderness.  LUNGS: Normal breath sounds bilaterally, no wheezing, rales,rhonchi , but basilar crepitations were noted bilaterally. Intermittent use of accessory muscles of respiration, especially on exertion.  CARDIOVASCULAR: S1, S2 . Rhythm was regular . No murmurs, rubs, or gallops.  ABDOMEN: Soft, nontender, nondistended. Bowel sounds present. No organomegaly or mass.  EXTREMITIES: 1+ lower extremity and pedal edema, no cyanosis, or clubbing. Spinal palpation reveals minimal if any tenderness in lower back NEUROLOGIC: Cranial nerves II through XII are intact. Muscle strength 5/5 in all extremities. Sensation intact. Gait not checked.  PSYCHIATRIC: The patient is alert and oriented x 3. Intermittently agitated, restless, unhappy, but also in pain SKIN: No obvious rash, lesion, or ulcer.   ORDERS/RESULTS REVIEWED:   CBC  Recent Labs Lab 10/07/16 1054 10/08/16 0430  WBC 13.9* 10.0  HGB 16.8 15.8  HCT 48.9 46.1  PLT 216 188  MCV 99.1 98.0  MCH 34.0 33.6  MCHC 34.3 34.3  RDW 14.9* 14.4   ------------------------------------------------------------------------------------------------------------------  Chemistries   Recent Labs Lab 10/07/16 1054 10/08/16 0430  NA 140 139  K 3.7 3.3*  CL 103 101  CO2 23 30  GLUCOSE 146* 131*  BUN 10 8  CREATININE 0.84 0.74  CALCIUM 8.6* 8.4*  MG  --  1.4*  AST 32  --   ALT 20  --   ALKPHOS 74  --  BILITOT 0.7  --    ------------------------------------------------------------------------------------------------------------------ estimated creatinine  clearance is 94.7 mL/min (by C-G formula based on SCr of 0.74 mg/dL). ------------------------------------------------------------------------------------------------------------------  Recent Labs  10/07/16 1054  TSH 1.149    Cardiac Enzymes  Recent Labs Lab 10/07/16 1752 10/07/16 1842 10/07/16 2332  TROPONINI <0.03 <0.03 <0.03   ------------------------------------------------------------------------------------------------------------------ Invalid input(s): POCBNP ---------------------------------------------------------------------------------------------------------------  RADIOLOGY: Dg Lumbar Spine 2-3 Views  Result Date: 10/07/2016 CLINICAL DATA:  Status post fall this morning. Persistent low back pain. EXAM: LUMBAR SPINE - 2-3 VIEW COMPARISON:  Coronal and sagittal reconstructed images from an abdominal and pelvic CT scan dated August 10, 2016 FINDINGS: There is known compression of the body of T12. The loss of height anteriorly is approximately 50% and posteriorly approximately 15%. This has progressed slightly since the previous study. The lumbar vertebral bodies are preserved in height. The disc space heights are reasonably well-maintained. There is facet joint hypertrophy from L3-4 through L5-S1. The pedicles and transverse processes are intact. There is gentle dextrocurvature centered at L3 which is stable. IMPRESSION: No acute lumbar spine abnormality. Slight further compression of the body of T12 as described above. Electronically Signed   By: David  Martinique M.D.   On: 10/07/2016 11:51   Dg Chest Portable 1 View  Result Date: 10/07/2016 CLINICAL DATA:  Fall. EXAM: PORTABLE CHEST 1 VIEW COMPARISON:  08/11/2016 FINDINGS: Low lung volumes. Improving bibasilar opacities from prior study. Heart is borderline in size with mild vascular congestion. No overt edema or visible effusions. No pneumothorax or visible acute bony abnormality. IMPRESSION: Low lung volumes.  Improving  bibasilar atelectasis. Mild vascular congestion. Electronically Signed   By: Rolm Baptise M.D.   On: 10/07/2016 11:26    EKG:  Orders placed or performed during the hospital encounter of 10/07/16  . ED EKG within 10 minutes  . ED EKG within 10 minutes    ASSESSMENT AND PLAN:  Principal Problem:   Atrial fibrillation with RVR ([facility]) #1 A. fib with RVR, status post amiodarone loading, now in sinus rhythm, change amiodarone IV drip to amiodarone orally, continue loading with high doses of medications, continue anticoagulation with Eliquis, Cardizem, metoprolol #2. Acute on chronic diastolic CHF, continue Lasix, follow ins and outs, weight, continue oxygen therapy as needed #3. Hypokalemia, supplementing orally #4. Hypomagnesemia, supplementing intravenously #5. Leukocytosis, resolved, no obvious infection #6. Low back pain, getting physical therapist involved recommendations, continue tramadol, follow closely, may need to have a MRI of lower thoracic/lumbar spine done, if pain is not well controlled, since x-ray revealed about 50% compression fraction at T12  Management plans discussed with the patient, family and they are in agreement.   DRUG ALLERGIES:  Allergies  Allergen Reactions  . Oxycodone Other (See Comments)    Hallucinations  . Cardura [Doxazosin Mesylate]   . Darvon [Propoxyphene]   . Penicillins   . Requip [Ropinirole Hcl]   . Septra [Sulfamethoxazole-Trimethoprim]     CODE STATUS:     Code Status Orders        Start     Ordered   10/07/16 1640  Full code  Continuous     10/07/16 1639    Code Status History    Date Active Date Inactive Code Status Order ID Comments User Context   08/10/2016  8:17 PM 08/20/2016  3:17 PM Full Code [id number]  Theodoro Grist, MD ED      TOTAL TIME TAKING CARE OF THIS PATIENT: 40 minutes.    Theodoro Grist M.D on 10/08/2016 at 4:08 PM  Between 7am to 6pm - Pager - [phone]  After 6pm go to [email or web address] - password [email or web address]  [facility]  Office  [phone]  CC: Primary care physician; Tracie Harrier, MD

## 2016-10-08 NOTE — Evaluation (Signed)
Physical Therapy Evaluation Patient Details Name: Brendan Edwards MRN: [id number] DOB: [birth year] Today's Date: 10/08/2016   History of Present Illness  Pt is a 71 yo M, admitted to CCU following a fall and tachycardia with dx of Afib  with RVR on 10/07/16. Prior to admission pt modI with all ADL's and IADL's; PLOF includes driving and working at a [employer]. PMH includes: Afib, benign prostatic hyperplasia, cardiomyopathy, GERD, gout, HLD, HTN, obesity, sleep apnea, and etoh abuse.   Clinical Impression  Pt is a pleasant 71 y.o. M, admitted to acute care following a fall and tachycardia with dx of Afib with RVR. Pt performs bed mobility with minA, limited only by LBP. Tranfers performed with supervision, due to impulsivity and c/o of high pain levels to low back. Ambulation performed with CGA due to impulsivity, generalized LE weakness, and impulsive behavior. Pt on 4 L of O2 upon arrival, but nasal cannula not in pt's nose and he was maintaining saturation at 97%. Pt amb 100 ft with RW on RA with no signs/sx, SOB, or reports of dizziness. Prior to leaving session, pt vitals were stable; no instances of HR exceeding 100 BPM throughout entirety of session. Pt presents with the following deficits: strength and pain. Overall, pt responded well to today's treatment with no adverse affects. Pt would benefit from skilled PT to address the previously mentioned impairments and promote return to PLOF. Currently recommending outpatient PT, pending d/c.     Follow Up Recommendations Outpatient PT    Equipment Recommendations  Rolling walker with 5" wheels    Recommendations for Other Services       Precautions / Restrictions Precautions Precautions: Fall Restrictions Weight Bearing Restrictions: No      Mobility  Bed Mobility Overal bed mobility: Needs Assistance Bed Mobility: Supine to Sit     Supine to sit: Min assist     General bed mobility comments: Pt minA with bed mobility for  physical assist, secondary to LBP.   Transfers Overall transfer level: Needs assistance Equipment used: Rolling walker (2 wheeled) Transfers: Sit to/from Stand Sit to Stand: Supervision         General transfer comment: Supervision with STS transfer secondary to impulsivity and increased LBP upon ascent.   Ambulation/Gait Ambulation/Gait assistance: Min guard Ambulation Distance (Feet): 100 Feet Assistive device: Rolling walker (2 wheeled) Gait Pattern/deviations: Step-through pattern     General Gait Details: MinA due to LE weakness and impulsivity. Min verbal cues to maintain close proximity to RW and amb with erect posture. Amb on room air with no signs/sx noted, including SOB and dizziness. PulseOx was not reading well.   Stairs            Wheelchair Mobility    Modified Rankin (Stroke Patients Only)       Balance Overall balance assessment: Needs assistance Sitting-balance support: Bilateral upper extremity supported;Feet supported Sitting balance-Leahy Scale: Good Sitting balance - Comments: Supervision with sitting balance, due to impulsivity.    Standing balance support: Bilateral upper extremity supported Standing balance-Leahy Scale: Good Standing balance comment: Able to stand with good balance with B UE support, though requires supervision due to impulsivity and pain through low back.                              Pertinent Vitals/Pain Pain Assessment: 0-10 Pain Score: 7  Pain Location: low back  Pain Descriptors / Indicators: Constant;Discomfort;Grimacing Pain Intervention(s): Limited activity  within patient's tolerance;Monitored during session;Patient requesting pain meds-RN notified    Home Living Family/patient expects to be discharged to:: Private residence Living Arrangements: Alone Available Help at Discharge: Family;Friend(s) (Intermittently; not 24/7) Type of Home: House Home Access: Stairs to enter Entrance Stairs-Rails: Can  reach both Entrance Stairs-Number of Steps: 6  Home Layout: One level Home Equipment: [place] - 2 wheels;Cane - single point      Prior Function           Comments: Amb with SPC     Hand Dominance        Extremity/Trunk Assessment   Upper Extremity Assessment Upper Extremity Assessment: Overall WFL for tasks assessed    Lower Extremity Assessment Lower Extremity Assessment: Generalized weakness (MMT: grossly 4/5 B LE's )       Communication   Communication: No difficulties  Cognition Arousal/Alertness: Awake/alert Behavior During Therapy: Agitated Overall Cognitive Status: Within Functional Limits for tasks assessed                                 General Comments: impulsive      General Comments      Exercises Other Exercises Other Exercises: Supine therex performed to B LE's with supervision x10 reps: ankle pumps, quad sets, hip abd, and SLR. min verbal and tactile cues to maintain correct mechanics.    Assessment/Plan    PT Assessment Patient needs continued PT services  PT Problem List Decreased strength;Decreased activity tolerance;Decreased mobility;Decreased balance;Decreased coordination;Decreased safety awareness;Pain       PT Treatment Interventions DME instruction;Gait training;Stair training;Functional mobility training;Therapeutic activities;Therapeutic exercise;Balance training;Neuromuscular re-education;Patient/family education    PT Goals (Current goals can be found in the Care Plan section)  Acute Rehab PT Goals Patient Stated Goal: to go back to work  PT Goal Formulation: With patient Time For Goal Achievement: 10/22/16 Potential to Achieve Goals: Good    Frequency Min 2X/week   Barriers to discharge        Co-evaluation               AM-PAC PT "6 Clicks" Daily Activity  Outcome Measure Difficulty turning over in bed (including adjusting bedclothes, sheets and blankets)?: Total Difficulty moving from lying  on back to sitting on the side of the bed? : Total Difficulty sitting down on and standing up from a chair with arms (e.g., wheelchair, bedside commode, etc,.)?: A Little Help needed moving to and from a bed to chair (including a wheelchair)?: A Little Help needed walking in hospital room?: A Little Help needed climbing 3-5 steps with a railing? : A Lot 6 Click Score: 13    End of Session Equipment Utilized During Treatment: Gait belt Activity Tolerance: Patient limited by pain Patient left: in chair;with call bell/phone within reach Nurse Communication: Mobility status;Patient requests pain meds PT Visit Diagnosis: Unsteadiness on feet (R26.81);History of falling (Z91.81);Muscle weakness (generalized) (M62.81);Pain    Time: 8299-3716 PT Time Calculation (min) (ACUTE ONLY): 32 min   Charges:         PT G Codes:        Oran Rein PT, SPT   Bevelyn Ngo 10/08/2016, 4:26 PM

## 2016-10-09 ENCOUNTER — Inpatient Hospital Stay: Payer: PPO

## 2016-10-09 LAB — BASIC METABOLIC PANEL
Anion gap: 9 (ref 5–15)
BUN: 13 mg/dL (ref 6–20)
CALCIUM: 8.5 mg/dL — AB (ref 8.9–10.3)
CO2: 29 mmol/L (ref 22–32)
CREATININE: 0.86 mg/dL (ref 0.61–1.24)
Chloride: 96 mmol/L — ABNORMAL LOW (ref 101–111)
GFR calc Af Amer: >60 mL/min (ref >60)
GLUCOSE: 123 mg/dL — AB (ref 65–99)
POTASSIUM: 4 mmol/L (ref 3.5–5.1)
SODIUM: 134 mmol/L — AB (ref 135–145)

## 2016-10-09 LAB — MAGNESIUM: MAGNESIUM: 1.9 mg/dL (ref 1.7–2.4)

## 2016-10-09 MED ORDER — IBUPROFEN 200 MG PO TABS
200.0000 mg | ORAL_TABLET | Freq: Four times a day (QID) | ORAL | Status: DC | PRN
  Administered 2016-10-09 – 2016-10-12 (×5): 200 mg via ORAL
  Filled 2016-10-09 (×6): qty 1

## 2016-10-09 MED ORDER — LORAZEPAM 2 MG/ML IJ SOLN
1.0000 mg | Freq: Once | INTRAMUSCULAR | Status: AC
  Administered 2016-10-09: 1 mg via INTRAVENOUS
  Filled 2016-10-09: qty 1

## 2016-10-09 MED ORDER — LIDOCAINE 5 % EX PTCH
1.0000 | MEDICATED_PATCH | CUTANEOUS | Status: DC
  Administered 2016-10-09 – 2016-10-12 (×4): 1 via TRANSDERMAL
  Filled 2016-10-09 (×4): qty 1

## 2016-10-09 MED ORDER — FUROSEMIDE 10 MG/ML IJ SOLN
20.0000 mg | Freq: Two times a day (BID) | INTRAMUSCULAR | Status: DC
  Administered 2016-10-09: 20 mg via INTRAVENOUS
  Filled 2016-10-09 (×2): qty 2

## 2016-10-09 MED ORDER — AMIODARONE HCL 200 MG PO TABS
400.0000 mg | ORAL_TABLET | Freq: Every day | ORAL | Status: DC
  Administered 2016-10-10 – 2016-10-12 (×3): 400 mg via ORAL
  Filled 2016-10-09 (×3): qty 2

## 2016-10-09 MED ORDER — HYDROCODONE-ACETAMINOPHEN 5-325 MG PO TABS
1.0000 | ORAL_TABLET | ORAL | Status: DC | PRN
  Administered 2016-10-09 – 2016-10-11 (×6): 1 via ORAL
  Filled 2016-10-09 (×6): qty 1

## 2016-10-09 NOTE — Care Management (Signed)
Patient admitted from home with afib RVR.  PCP Hande.  Patient on eliquis prior to admission.  PT has assessed patient and recommended outpatient PT.  Patient has a walker and cane in the home for ambulation.  Referral was made to [facility] home health on 08/21/16.  Awaiting return call from [facility] to determine if patient is still active.  RNCM following.

## 2016-10-09 NOTE — Progress Notes (Signed)
[name] NAME: Brendan Edwards    MR#:  [id number]  DATE OF BIRTH:  05-18-1945  SUBJECTIVE:  CHIEF COMPLAINT:   Chief Complaint  Patient presents with  . Fall  . Tachycardia  The patient is 71 year old Caucasian male with past medical history significant for history of chronic atrial fibrillation, hyperlipidemia, hypertension, sleep apnea, BPH, Heidi, myopathy, who presents to the hospital with complaints of 3-4 day history of dizziness, lower extremity swelling. He fell down at home and was brought to emergency room for further evaluation, since was complaining of back pain. In emergency room, he was noted to be in A. fib, RVR and was admitted. He was initiated on an amiodarone drip, now converted to sinus rhythm. He feels good, however, admits of low back pain. Patient was changed to amiodarone orally and his heart rate remained stable. He denies any chest pains or shortness of breath, admits of significant lower back pains after fall, no significant help with tramadol . Physical therapist recommended outpatient physical therapy. Hypoxic on exertion, O2 sats of 87% on room air  Review of Systems  Constitutional: Negative for chills, fever and weight loss.  HENT: Negative for congestion.   Eyes: Negative for blurred vision and double vision.  Respiratory: Positive for shortness of breath. Negative for cough, sputum production and wheezing.   Cardiovascular: Positive for orthopnea and leg swelling. Negative for chest pain, palpitations and PND.  Gastrointestinal: Negative for abdominal pain, blood in stool, constipation, diarrhea, nausea and vomiting.  Genitourinary: Negative for dysuria, frequency, hematuria and urgency.  Musculoskeletal: Positive for back pain and falls.  Neurological: Negative for dizziness, tremors, focal weakness and headaches.  Endo/Heme/Allergies: Does not bruise/bleed easily.  Psychiatric/Behavioral:  Negative for depression. The patient does not have insomnia.     VITAL SIGNS: Blood pressure (!) 146/71, pulse 62, temperature 98.6 F (37 C), temperature source Oral, resp. rate 20, height 5\' 8"  (1.727 m), weight 94.2 kg (207 lb 11.2 oz), SpO2 92 %.  PHYSICAL EXAMINATION:   GENERAL:  71 y.o.-year-old patient lying in the bed in mild to moderate respiratory distress., Also very uncomfortable due to pain, especially with movements in the bed  EYES: Pupils equal, round, reactive to light and accommodation. No scleral icterus. Extraocular muscles intact.  HEENT: Head atraumatic, normocephalic. Oropharynx and nasopharynx clear.  NECK:  Supple, no jugular venous distention. No thyroid enlargement, no tenderness.  LUNGS: Some diminished breath sounds bilaterally, no wheezing, rales,rhonchi , but basilar crepitations were noted bilaterally. Intermittent use of accessory muscles of respiration, especially on exertion.  CARDIOVASCULAR: S1, S2 . Rhythm was regular . No murmurs, rubs, or gallops.  ABDOMEN: Soft, nontender, nondistended. Bowel sounds present. No organomegaly or mass.  EXTREMITIES: 1+ lower extremity and pedal edema, no cyanosis, or clubbing. Spinal palpation reveals minimal if any tenderness in lower back NEUROLOGIC: Cranial nerves II through XII are intact. Muscle strength 5/5 in all extremities. Sensation intact. Gait not checked.  PSYCHIATRIC: The patient is alert and oriented x 3. Intermittently agitated, restless, unhappy, but also in pain SKIN: No obvious rash, lesion, or ulcer.   ORDERS/RESULTS REVIEWED:   CBC  Recent Labs Lab 10/07/16 1054 10/08/16 0430  WBC 13.9* 10.0  HGB 16.8 15.8  HCT 48.9 46.1  PLT 216 188  MCV 99.1 98.0  MCH 34.0 33.6  MCHC 34.3 34.3  RDW 14.9* 14.4   ------------------------------------------------------------------------------------------------------------------  Chemistries   Recent Labs Lab 10/07/16 1054 10/08/16 0430  10/09/16 0317    NA 140 139 134*  K 3.7 3.3* 4.0  CL 103 101 96*  CO2 23 30 29   GLUCOSE 146* 131* 123*  BUN 10 8 13   CREATININE 0.84 0.74 0.86  CALCIUM 8.6* 8.4* 8.5*  MG  --  1.4* 1.9  AST 32  --   --   ALT 20  --   --   ALKPHOS 74  --   --   BILITOT 0.7  --   --    ------------------------------------------------------------------------------------------------------------------ estimated creatinine clearance is 86.4 mL/min (by C-G formula based on SCr of 0.86 mg/dL). ------------------------------------------------------------------------------------------------------------------  Recent Labs  10/07/16 1054  TSH 1.149    Cardiac Enzymes  Recent Labs Lab 10/07/16 1752 10/07/16 1842 10/07/16 2332  TROPONINI <0.03 <0.03 <0.03   ------------------------------------------------------------------------------------------------------------------ Invalid input(s): POCBNP ---------------------------------------------------------------------------------------------------------------  RADIOLOGY: No results found.  EKG:  Orders placed or performed during the hospital encounter of 10/07/16  . ED EKG within 10 minutes  . ED EKG within 10 minutes    ASSESSMENT AND PLAN:  Principal Problem:   Atrial fibrillation with RVR (HCC) #1 A. fib with RVR, status post amiodarone loading, now in sinus rhythm,  Continue amiodarone orally,  continue anticoagulation with Eliquis, continue home doses of Cardizem, metoprolol, heart rate is in good control #2. Acute on chronic diastolic CHF, continue Lasix, follow ins and outs, weight, continue oxygen therapy as needed, wean off of oxygen as tolerated. Echocardiogram done during this admission revealed normal ejection fraction, no valvular abnormalities #3. Hypokalemia, resolved #4. Hypomagnesemia, resolved #5. Leukocytosis, resolved, no obvious infection #6. Low back pain due to worsening T12 compression fraction, although physical therapist recommended  outpatient physical therapy, patient continues to be very uncomfortable, complains of pain, getting MRI of thoracic spine , get ortho consult for kyphoplasty, if acute changes are demonstrated on MRI.   Management plans discussed with the patient, family and they are in agreement.   DRUG ALLERGIES:  Allergies  Allergen Reactions  . Oxycodone Other (See Comments)    Hallucinations  . Cardura [Doxazosin Mesylate]   . Darvon [Propoxyphene]   . Penicillins   . Requip [Ropinirole Hcl]   . Septra [Sulfamethoxazole-Trimethoprim]     CODE STATUS:     Code Status Orders        Start     Ordered   10/07/16 1640  Full code  Continuous     10/07/16 1639    Code Status History    Date Active Date Inactive Code Status Order ID Comments User Context   08/10/2016  8:17 PM 08/20/2016  3:17 PM Full Code [id number]  Theodoro Grist, MD ED      TOTAL TIME TAKING CARE OF THIS PATIENT: 35 minutes.    Theodoro Grist M.D on 10/09/2016 at 3:27 PM  Between 7am to 6pm - Pager - [phone]  After 6pm go to [email or web address] - password [email or web address] [facility]  Office  [phone]  CC: Primary care physician; Tracie Harrier, MD

## 2016-10-09 NOTE — Care Management Important Message (Signed)
Important Message  Patient Details  Name: Brendan Edwards MRN: [id number] Date of Birth: 1946/02/14   Medicare Important Message Given:  Yes    Beverly Sessions, RN 10/09/2016, 1:26 PM

## 2016-10-09 NOTE — Care Management Important Message (Signed)
Important Message  Patient Details  Name: Brendan Edwards MRN: [id number] Date of Birth: 08-Jul-1945   Medicare Important Message Given:  Yes    Beverly Sessions, RN 10/09/2016, 1:26 PM

## 2016-10-09 NOTE — Progress Notes (Signed)
Pharmacy Consult for Electrolyte Monitoring Indication: Hypokalemia and Hypomagnesemia  Allergies  Allergen Reactions  . Oxycodone Other (See Comments)    Hallucinations  . Cardura [Doxazosin Mesylate]   . Darvon [Propoxyphene]   . Penicillins   . Requip [Ropinirole Hcl]   . Septra [Sulfamethoxazole-Trimethoprim]     Patient Measurements: Height: 5\' 8"  (172.7 cm) Weight: 207 lb 11.2 oz (94.2 kg) IBW/kg (Calculated) : 68.4  Vital Signs: Temp: 98.3 F (36.8 C) (06/29 0433) Temp Source: Oral (06/29 0433) BP: 149/73 (06/29 0433) Pulse Rate: 63 (06/29 0433) Intake/Output from previous day: 06/28 0701 - 06/29 0700 In: 220.2 [P.O.:120; I.V.:100.2] Out: 1600 [Urine:1600] Intake/Output from this shift: Total I/O In: -  Out: 1200 [Urine:1200]  Labs:  Recent Labs  10/07/16 1054 10/08/16 0430 10/09/16 0317  WBC 13.9* 10.0  --   HGB 16.8 15.8  --   HCT 48.9 46.1  --   PLT 216 188  --   APTT 28  --   --   CREATININE 0.84 0.74 0.86  MG  --  1.4* 1.9  ALBUMIN 3.9  --   --   PROT 7.7  --   --   AST 32  --   --   ALT 20  --   --   ALKPHOS 74  --   --   BILITOT 0.7  --   --    Estimated Creatinine Clearance: 86.4 mL/min (by C-G formula based on SCr of 0.86 mg/dL).  Potassium (mmol/L)  Date Value  10/09/2016 4.0  02/09/2012 3.4 (L)   Calcium (mg/dL)  Date Value  10/09/2016 8.5 (L)   Calcium, Total (mg/dL)  Date Value  02/09/2012 8.8   Sodium (mmol/L)  Date Value  10/09/2016 134 (L)  02/09/2012 141     Medical History: Past Medical History:  Diagnosis Date  . A-fib ([place])   . Benign prostatic hyperplasia   . Cardiomyopathy, secondary ([place])   . GERD (gastroesophageal reflux disease)   . Gout   . Hyperlipidemia   . Hypertension   . Obesity   . Sleep apnea     Assessment: 71 y/o M with a h/o atrial fibrillation, congestive heart failure, and alcohol abuse admitted with atrial fibrillation with rapid ventricular response. Patient is on scheduled Lasix  and oral potassium supplementation.   Plan:  Will order magnesium sulfate 4 g iv once and KCl 40 meq po once (in addition to scheduled KCl) and f/u AM labs.   6/29 AM K+, Mg WNL. F/u BMP with tomorrow AM labs.  Jeyren Danowski S 10/09/2016,6:24 AM

## 2016-10-09 NOTE — Progress Notes (Signed)
O2 sat on room air 88%. Restarted on 2L [place].  Sats increased to 92%

## 2016-10-09 NOTE — Progress Notes (Signed)
       [facility]  SUBJECTIVE: anxious to go hoome. Still a little sob. No chest pain   Vitals:   10/08/16 2001 10/09/16 0433 10/09/16 0740 10/09/16 0741  BP: (!) 147/83 (!) 149/73 (!) 146/71   Pulse: 63 63 65 62  Resp: 18 18 20    Temp: 98.4 F (36.9 C) 98.3 F (36.8 C) 98.6 F (37 C)   TempSrc: Oral Oral Oral   SpO2: 98% 90% (!) 87% 92%  Weight:      Height:        Intake/Output Summary (Last 24 hours) at 10/09/16 1149 Last data filed at 10/09/16 0950  Gross per 24 hour  Intake            753.4 ml  Output             1825 ml  Net          -1071.6 ml    LABS: Basic Metabolic Panel:  Recent Labs  10/08/16 0430 10/09/16 0317  NA 139 134*  K 3.3* 4.0  CL 101 96*  CO2 30 29  GLUCOSE 131* 123*  BUN 8 13  CREATININE 0.74 0.86  CALCIUM 8.4* 8.5*  MG 1.4* 1.9   Liver Function Tests:  Recent Labs  10/07/16 1054  AST 32  ALT 20  ALKPHOS 74  BILITOT 0.7  PROT 7.7  ALBUMIN 3.9   No results for input(s): LIPASE, AMYLASE in the last 72 hours. CBC:  Recent Labs  10/07/16 1054 10/08/16 0430  WBC 13.9* 10.0  HGB 16.8 15.8  HCT 48.9 46.1  MCV 99.1 98.0  PLT 216 188   Cardiac Enzymes:  Recent Labs  10/07/16 1752 10/07/16 1842 10/07/16 2332  TROPONINI <0.03 <0.03 <0.03   BNP: Invalid input(s): POCBNP D-Dimer: No results for input(s): DDIMER in the last 72 hours. Hemoglobin A1C: No results for input(s): HGBA1C in the last 72 hours. Fasting Lipid Panel: No results for input(s): CHOL, HDL, LDLCALC, TRIG, CHOLHDL, LDLDIRECT in the last 72 hours. Thyroid Function Tests:  Recent Labs  10/07/16 1054  TSH 1.149   Anemia Panel: No results for input(s): VITAMINB12, FOLATE, FERRITIN, TIBC, IRON, RETICCTPCT in the last 72 hours.   Physical Exam: Blood pressure (!) 146/71, pulse 62, temperature 98.6 F (37 C), temperature source Oral, resp. rate 20, height 5\' 8"  (1.727 m), weight 94.2 kg (207 lb 11.2  oz), SpO2 92 %.   Wt Readings from Last 1 Encounters:  10/08/16 94.2 kg (207 lb 11.2 oz)     General appearance: alert and cooperative Resp: rhonchi bilaterally Cardio: regular rate and rhythm GI: soft, non-tender; bowel sounds normal; no masses,  no organomegaly Extremities: extremities normal, atraumatic, no cyanosis or edema Neurologic: Grossly normal  TELEMETRY: Reviewed telemetry pt in nsr:  ASSESSMENT AND PLAN:  Principal Problem:   Atrial fibrillation with RVR (HCC)-appears to have converted to nsr. Will continue with eliquis and amiodarone po.Will continue with diltiazem 360 daily, metoprolol 100 daily and will change amiodarone to 400 mg daily.  Compliance was stressed. Avoidance of etoh stressed.     Teodoro Spray, MD, [facility] 10/09/2016 11:49 AM

## 2016-10-09 NOTE — Discharge Instructions (Signed)

## 2016-10-09 NOTE — Care Management (Signed)
Spoke with Malachy Mood at [facility].  Confirmed that patient was discharged from services 09/11/16. They are able to accept the patient back if services are required at discharge.

## 2016-10-10 LAB — BASIC METABOLIC PANEL
ANION GAP: 6 (ref 5–15)
BUN: 13 mg/dL (ref 6–20)
CHLORIDE: 96 mmol/L — AB (ref 101–111)
CO2: 31 mmol/L (ref 22–32)
Calcium: 8.4 mg/dL — ABNORMAL LOW (ref 8.9–10.3)
Creatinine, Ser: 0.82 mg/dL (ref 0.61–1.24)
GFR calc non Af Amer: >60 mL/min (ref >60)
Glucose, Bld: 180 mg/dL — ABNORMAL HIGH (ref 65–99)
Potassium: 3.6 mmol/L (ref 3.5–5.1)
Sodium: 133 mmol/L — ABNORMAL LOW (ref 135–145)

## 2016-10-10 MED ORDER — FUROSEMIDE 10 MG/ML IJ SOLN
40.0000 mg | Freq: Two times a day (BID) | INTRAMUSCULAR | Status: DC
  Administered 2016-10-10 – 2016-10-12 (×4): 40 mg via INTRAVENOUS
  Filled 2016-10-10 (×4): qty 4

## 2016-10-10 NOTE — Progress Notes (Signed)
[facility] [place] NAME: Brendan Edwards    MR#:  [id number]  DATE OF BIRTH:  Sep 04, 1945  SUBJECTIVE:   Patient here after a fall and noted to be tachycardic with atrial fibrillation with rapid ventricular response. Heart rates are still labile on ambulation. Patient's back pain has improved. No chest pains, shortness of breath or any other associated symptoms.  REVIEW OF SYSTEMS:    Review of Systems  Constitutional: Negative for chills and fever.  HENT: Negative for congestion and tinnitus.   Eyes: Negative for blurred vision and double vision.  Respiratory: Negative for cough, shortness of breath and wheezing.   Cardiovascular: Negative for chest pain, orthopnea and PND.  Gastrointestinal: Negative for abdominal pain, diarrhea, nausea and vomiting.  Genitourinary: Negative for dysuria and hematuria.  Neurological: Negative for dizziness, sensory change and focal weakness.  All other systems reviewed and are negative.   Nutrition: heart Healthy/Carb control Tolerating Diet: Yes Tolerating PT: Eval noted.   DRUG ALLERGIES:   Allergies  Allergen Reactions  . Oxycodone Other (See Comments)    Hallucinations  . Cardura [Doxazosin Mesylate]   . Darvon [Propoxyphene]   . Penicillins   . Requip [Ropinirole Hcl]   . Septra [Sulfamethoxazole-Trimethoprim]     VITALS:  Blood pressure (!) 136/96, pulse 69, temperature 97.5 F (36.4 C), temperature source Oral, resp. rate 18, height 5\' 8"  (1.727 m), weight 94.2 kg (207 lb 11.2 oz), SpO2 98 %.  PHYSICAL EXAMINATION:   Physical Exam  GENERAL:  71 y.o.-year-old patient lying in bed in no acute distress.  EYES: Pupils equal, round, reactive to light and accommodation. No scleral icterus. Extraocular muscles intact.  HEENT: Head atraumatic, normocephalic. Oropharynx and nasopharynx clear.  NECK:  Supple, no jugular venous distention. No thyroid enlargement, no tenderness.  LUNGS: Normal  breath sounds bilaterally, no wheezing, bibasliar rales, No rhonchi. No use of accessory muscles of respiration.  CARDIOVASCULAR: S1, S2 Irregular and tachycardic. No murmurs, rubs, or gallops.  ABDOMEN: Soft, nontender, nondistended. Bowel sounds present. No organomegaly or mass.  EXTREMITIES: No cyanosis, clubbing or edema b/l.    NEUROLOGIC: Cranial nerves II through XII are intact. No focal Motor or sensory deficits b/l.   PSYCHIATRIC: The patient is alert and oriented x 3.  SKIN: No obvious rash, lesion, or ulcer.    LABORATORY PANEL:   CBC  Recent Labs Lab 10/08/16 0430  WBC 10.0  HGB 15.8  HCT 46.1  PLT 188   ------------------------------------------------------------------------------------------------------------------  Chemistries   Recent Labs Lab 10/07/16 1054  10/09/16 0317 10/10/16 0326  NA 140  < > 134* 133*  K 3.7  < > 4.0 3.6  CL 103  < > 96* 96*  CO2 23  < > 29 31  GLUCOSE 146*  < > 123* 180*  BUN 10  < > 13 13  CREATININE 0.84  < > 0.86 0.82  CALCIUM 8.6*  < > 8.5* 8.4*  MG  --   < > 1.9  --   AST 32  --   --   --   ALT 20  --   --   --   ALKPHOS 74  --   --   --   BILITOT 0.7  --   --   --   < > = values in this interval not displayed. ------------------------------------------------------------------------------------------------------------------  Cardiac Enzymes  Recent Labs Lab 10/07/16 2332  TROPONINI <0.03   ------------------------------------------------------------------------------------------------------------------  RADIOLOGY:  Mr Thoracic  Spine Wo Contrast  Result Date: 10/09/2016 CLINICAL DATA:  T12 fracture EXAM: MRI THORACIC SPINE WITHOUT CONTRAST TECHNIQUE: Multiplanar, multisequence MR imaging of the thoracic spine was performed. No intravenous contrast was administered. COMPARISON:  Lumbar spine radiograph 10/07/2016 FINDINGS: Alignment:  No static subluxation Vertebrae: There are multiple thoracic compression  deformities without significant edema, likely chronic. Cord:  Normal signal and caliber Paraspinal and other soft tissues: Negative. Disc levels: T3-T4:  Small central disc protrusion without stenosis. T5-T6:  Small central disc protrusion without stenosis. The other thoracic disc levels show no focal herniation, spinal canal stenosis or neural foraminal stenosis. 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 No spinal canal or neural foraminal stenosis. Electronically Signed   By: Ulyses Jarred M.D.   On: 10/09/2016 18:39     ASSESSMENT AND PLAN:   71 year old male with past medical history of hypertension, hyperlipidemia, GERD, chronic atrial fibrillation, BPH, obstructive sleep apnea on presented to the hospital after a fall and noted to be tachycardic.  1. Atrial fibrillation with rapid ventricular response-patient's heart rates are still somewhat labile. -Continue amiodarone, Cardizem, metoprolol. -cont. Eliquis.  Cardiology following and await further input.   - Echo showing normal EF.   2. CHF -  Acute on chronic diastolic CHF.  - still seems a bit fluid overloaded. Will increase Lasix to 40 bid and monitor.  - follow I's and O's and daily weights.  - cont. Lisinopril, Metoprolol.   3. Hx of Gout - no acute attack.  - cont. Allopurinol  4. Restless Leg syndrome - cont. Requip  5.  Hypertension - cont. Lisinopril, Cardizem, Toprol.    All the records are reviewed and case discussed with Care Management/Social Worker. Management plans discussed with the patient, family and they are in agreement.  CODE STATUS: Full code  DVT Prophylaxis: Eliquis  TOTAL TIME TAKING CARE OF THIS PATIENT: 30 minutes.   POSSIBLE D/C IN 1-2 DAYS, DEPENDING ON CLINICAL CONDITION.   Henreitta Leber M.D on 10/10/2016 at 1:30 PM  Between 7am to 6pm - Pager - [phone]  After 6pm go to [email or web address] - Proofreader  [facility]  Office   [phone]  CC: Primary care physician; Tracie Harrier, MD

## 2016-10-10 NOTE — Progress Notes (Signed)
Physical Therapy Treatment Patient Details Name: Brendan Edwards MRN: [id number] DOB: 14-Dec-1945 Today's Date: 10/10/2016    History of Present Illness Pt is a 71 yo M, admitted to CCU following a fall and tachycardia with dx of Afib  with RVR on 10/07/16. Prior to admission pt modI with all ADL's and IADL's; PLOF includes driving and working at a [employer] psych[employer]. PMH includes: Afib, benign prostatic hyperplasia, cardiomyopathy, GERD, gout, HLD, HTN, obesity, sleep apnea, and etoh abuse.     PT Comments    Pt asleep but agrees to session.  Bed mobility with rail and HOB raised with supervision.  Pt HR at rest 104 and increased at times to 145 with standing exercises.  Pt given frequent rest breaks to allow HR to decreased.  Discussed with primary nurse and OK to ambulate as pt with possible discharge to home today.  He was able to ambulate to nursing station and back to room.  HR varied during gait but returned to 112 upon return to supine.  Pt continues with some decreased safety awareness and during turns will get feet outside of walker base.  Stated he used [facility] at home but relied heavily on walker for assist today.  Stated he has one at home that he could use.    Follow Up Recommendations  Outpatient PT, supervision with mobility.     Equipment Recommendations  Rolling walker with 5" wheels    Recommendations for Other Services       Precautions / Restrictions Precautions Precautions: Fall Restrictions Weight Bearing Restrictions: No    Mobility  Bed Mobility Overal bed mobility: Needs Assistance Bed Mobility: Supine to Sit     Supine to sit: Min guard     General bed mobility comments: HOB raised and rails used  Transfers Overall transfer level: Needs assistance Equipment used: Rolling walker (2 wheeled) Transfers: Sit to/from Stand Sit to Stand: Supervision            Ambulation/Gait Ambulation/Gait assistance: Min guard Ambulation Distance (Feet): 100  Feet Assistive device: Rolling walker (2 wheeled) Gait Pattern/deviations: Step-through pattern   Gait velocity interpretation: Below normal speed for age/gender General Gait Details: feet outside of walker base while turning.   Stairs            Wheelchair Mobility    Modified Rankin (Stroke Patients Only)       Balance Overall balance assessment: Needs assistance Sitting-balance support: Bilateral upper extremity supported;Feet supported Sitting balance-Leahy Scale: Good     Standing balance support: Bilateral upper extremity supported Standing balance-Leahy Scale: Good Standing balance comment: Able to stand with good balance with B UE support, though requires supervision due to impulsivity                            Cognition Arousal/Alertness: Awake/alert Behavior During Therapy: WFL for tasks assessed/performed;Agitated Overall Cognitive Status: Within Functional Limits for tasks assessed                                 General Comments: Agitated at times but cooperative      Exercises Other Exercises Other Exercises: standing exercises with walker x 10 for marches, SLR and toe raises    General Comments        Pertinent Vitals/Pain Pain Assessment: No/denies pain Pain Intervention(s): Monitored during session    Home Living  Prior Function            PT Goals (current goals can now be found in the care plan section) Progress towards PT goals: Progressing toward goals    Frequency    Min 2X/week      PT Plan      Co-evaluation              AM-PAC PT "6 Clicks" Daily Activity  Outcome Measure  Difficulty turning over in bed (including adjusting bedclothes, sheets and blankets)?: A Little Difficulty moving from lying on back to sitting on the side of the bed? : A Little Difficulty sitting down on and standing up from a chair with arms (e.g., wheelchair, bedside commode,  etc,.)?: A Little Help needed moving to and from a bed to chair (including a wheelchair)?: A Little Help needed walking in hospital room?: A Little Help needed climbing 3-5 steps with a railing? : A Lot 6 Click Score: 17    End of Session Equipment Utilized During Treatment: Gait belt Activity Tolerance: Patient tolerated treatment well Patient left: in bed;with bed alarm set;with call bell/phone within reach Nurse Communication: Other (comment)       Time: 8590-9311 PT Time Calculation (min) (ACUTE ONLY): 17 min  Charges:  $Gait Training: 8-22 mins                    G Codes:      Chesley Noon, PTA 10/10/16, 10:47 AM

## 2016-10-10 NOTE — Progress Notes (Signed)
Pharmacy Consult for Electrolyte Monitoring Indication: Hypokalemia and Hypomagnesemia  Allergies  Allergen Reactions  . Oxycodone Other (See Comments)    Hallucinations  . Cardura [Doxazosin Mesylate]   . Darvon [Propoxyphene]   . Penicillins   . Requip [Ropinirole Hcl]   . Septra [Sulfamethoxazole-Trimethoprim]     Patient Measurements: Height: 5\' 8"  (172.7 cm) Weight: 207 lb 11.2 oz (94.2 kg) IBW/kg (Calculated) : 68.4  Vital Signs: Temp: 98.5 F (36.9 C) (06/30 0405) Temp Source: Oral (06/30 0405) BP: 136/88 (06/30 0405) Pulse Rate: 118 (06/30 0405) Intake/Output from previous day: 06/29 0701 - 06/30 0700 In: 960 [P.O.:960] Out: 725 [Urine:725] Intake/Output from this shift: Total I/O In: -  Out: 500 [Urine:500]  Labs:  Recent Labs  10/07/16 1054 10/08/16 0430 10/09/16 0317 10/10/16 0326  WBC 13.9* 10.0  --   --   HGB 16.8 15.8  --   --   HCT 48.9 46.1  --   --   PLT 216 188  --   --   APTT 28  --   --   --   CREATININE 0.84 0.74 0.86 0.82  MG  --  1.4* 1.9  --   ALBUMIN 3.9  --   --   --   PROT 7.7  --   --   --   AST 32  --   --   --   ALT 20  --   --   --   ALKPHOS 74  --   --   --   BILITOT 0.7  --   --   --    Estimated Creatinine Clearance: 90.6 mL/min (by C-G formula based on SCr of 0.82 mg/dL).  Potassium (mmol/L)  Date Value  10/10/2016 3.6  02/09/2012 3.4 (L)   Calcium (mg/dL)  Date Value  10/10/2016 8.4 (L)   Calcium, Total (mg/dL)  Date Value  02/09/2012 8.8   Sodium (mmol/L)  Date Value  10/10/2016 133 (L)  02/09/2012 141     Medical History: Past Medical History:  Diagnosis Date  . A-fib ([place])   . Benign prostatic hyperplasia   . Cardiomyopathy, secondary ([place])   . GERD (gastroesophageal reflux disease)   . Gout   . Hyperlipidemia   . Hypertension   . Obesity   . Sleep apnea     Assessment: 71 y/o M with a h/o atrial fibrillation, congestive heart failure, and alcohol abuse admitted with atrial fibrillation  with rapid ventricular response. Patient is on scheduled Lasix and oral potassium supplementation.   Plan:  Will order magnesium sulfate 4 g iv once and KCl 40 meq po once (in addition to scheduled KCl) and f/u AM labs.   6/29 AM K+, Mg WNL. F/u BMP with tomorrow AM labs.  6/30 BMP. No indication for electrolyte replacement. F/u BMP, Mg in AM.  Han Vejar S 10/10/2016,5:33 AM

## 2016-10-11 LAB — BASIC METABOLIC PANEL
Anion gap: 7 (ref 5–15)
BUN: 12 mg/dL (ref 6–20)
CO2: 32 mmol/L (ref 22–32)
Calcium: 8.8 mg/dL — ABNORMAL LOW (ref 8.9–10.3)
Chloride: 96 mmol/L — ABNORMAL LOW (ref 101–111)
Creatinine, Ser: 0.88 mg/dL (ref 0.61–1.24)
GFR calc Af Amer: >60 mL/min (ref >60)
GLUCOSE: 109 mg/dL — AB (ref 65–99)
POTASSIUM: 3.8 mmol/L (ref 3.5–5.1)
SODIUM: 135 mmol/L (ref 135–145)

## 2016-10-11 LAB — CBC
HCT: 45.6 % (ref 40.0–52.0)
HEMOGLOBIN: 15.8 g/dL (ref 13.0–18.0)
MCH: 33.6 pg (ref 26.0–34.0)
MCHC: 34.5 g/dL (ref 32.0–36.0)
MCV: 97.4 fL (ref 80.0–100.0)
Platelets: 193 10*3/uL (ref 150–440)
RBC: 4.69 MIL/uL (ref 4.40–5.90)
RDW: 14.4 % (ref 11.5–14.5)
WBC: 8.6 10*3/uL (ref 3.8–10.6)

## 2016-10-11 LAB — MAGNESIUM: MAGNESIUM: 1.5 mg/dL — AB (ref 1.7–2.4)

## 2016-10-11 MED ORDER — DIGOXIN 0.25 MG/ML IJ SOLN
0.2500 mg | Freq: Once | INTRAMUSCULAR | Status: AC
  Administered 2016-10-11: 0.25 mg via INTRAVENOUS
  Filled 2016-10-11: qty 1

## 2016-10-11 MED ORDER — MAGNESIUM SULFATE 2 GM/50ML IV SOLN
2.0000 g | Freq: Once | INTRAVENOUS | Status: AC
  Administered 2016-10-11: 2 g via INTRAVENOUS
  Filled 2016-10-11: qty 50

## 2016-10-11 MED ORDER — DIGOXIN 0.25 MG/ML IJ SOLN
0.5000 mg | INTRAMUSCULAR | Status: AC
  Administered 2016-10-11: 0.5 mg via INTRAVENOUS
  Filled 2016-10-11 (×2): qty 2

## 2016-10-11 NOTE — Plan of Care (Signed)
Problem: Activity: Goal: Risk for activity intolerance will decrease Outcome: Not Progressing Patient ambulated around nurses station with standby assistance from nurse tech and use of walker. Patients HR went in to 140's with ambulation and patient reported feeling dizzy. Patient was taken back to room and placed in chair. HR went down into low 100's, will continue to monitor.

## 2016-10-11 NOTE — Progress Notes (Signed)
Pharmacy Consult for Electrolyte Monitoring Indication: Hypokalemia and Hypomagnesemia  Allergies  Allergen Reactions  . Oxycodone Other (See Comments)    Hallucinations  . Cardura [Doxazosin Mesylate]   . Darvon [Propoxyphene]   . Penicillins   . Requip [Ropinirole Hcl]   . Septra [Sulfamethoxazole-Trimethoprim]     Patient Measurements: Height: 5\' 8"  (172.7 cm) Weight: 207 lb 11.2 oz (94.2 kg) IBW/kg (Calculated) : 68.4  Vital Signs: Temp: 97.7 F (36.5 C) (07/01 0426) Temp Source: Oral (07/01 0426) BP: 136/85 (07/01 0426) Pulse Rate: 100 (07/01 0426) Intake/Output from previous day: 06/30 0701 - 07/01 0700 In: 1080 [P.O.:1080] Out: 1350 [Urine:1350] Intake/Output from this shift: Total I/O In: -  Out: 650 [Urine:650]  Labs:  Recent Labs  10/09/16 0317 10/10/16 0326 10/11/16 0329  WBC  --   --  8.6  HGB  --   --  15.8  HCT  --   --  45.6  PLT  --   --  193  CREATININE 0.86 0.82 0.88  MG 1.9  --  1.5*   Estimated Creatinine Clearance: 84.5 mL/min (by C-G formula based on SCr of 0.88 mg/dL).  Potassium (mmol/L)  Date Value  10/11/2016 3.8  02/09/2012 3.4 (L)   Calcium (mg/dL)  Date Value  10/11/2016 8.8 (L)   Calcium, Total (mg/dL)  Date Value  02/09/2012 8.8   Sodium (mmol/L)  Date Value  10/11/2016 135  02/09/2012 141     Medical History: Past Medical History:  Diagnosis Date  . A-fib ([place])   . Benign prostatic hyperplasia   . Cardiomyopathy, secondary ([place])   . GERD (gastroesophageal reflux disease)   . Gout   . Hyperlipidemia   . Hypertension   . Obesity   . Sleep apnea     Assessment: 71 y/o M with a h/o atrial fibrillation, congestive heart failure, and alcohol abuse admitted with atrial fibrillation with rapid ventricular response. Patient is on scheduled Lasix and oral potassium supplementation.   Plan:  Will order magnesium sulfate 4 g iv once and KCl 40 meq po once (in addition to scheduled KCl) and f/u AM labs.    6/29 AM K+, Mg WNL. F/u BMP with tomorrow AM labs.  6/30 BMP. No indication for electrolyte replacement. F/u BMP, Mg in AM.  7/1 AM Mg 1.5. 2 grams magnesium sulfate IV ordered. Recheck BMP and Mg tomorrow AM.  Neeley Sedivy S 10/11/2016,5:37 AM

## 2016-10-11 NOTE — Progress Notes (Signed)
[facility] [place] NAME: Brendan Edwards    MR#:  [id number]  DATE OF BIRTH:  1945-05-14  SUBJECTIVE:   Patient here after a fall and noted to be tachycardic due to atrial fibrillation with rapid ventricular response. Heart rates are still labile.  Back pain has improved.  Shortness of breath improved.   REVIEW OF SYSTEMS:    Review of Systems  Constitutional: Negative for chills and fever.  HENT: Negative for congestion and tinnitus.   Eyes: Negative for blurred vision and double vision.  Respiratory: Negative for cough, shortness of breath and wheezing.   Cardiovascular: Negative for chest pain, orthopnea and PND.  Gastrointestinal: Negative for abdominal pain, diarrhea, nausea and vomiting.  Genitourinary: Negative for dysuria and hematuria.  Neurological: Negative for dizziness, sensory change and focal weakness.  All other systems reviewed and are negative.   Nutrition: heart Healthy/Carb control Tolerating Diet: Yes Tolerating PT: Eval noted.   DRUG ALLERGIES:   Allergies  Allergen Reactions  . Oxycodone Other (See Comments)    Hallucinations  . Cardura [Doxazosin Mesylate]   . Darvon [Propoxyphene]   . Penicillins   . Requip [Ropinirole Hcl]   . Septra [Sulfamethoxazole-Trimethoprim]     VITALS:  Blood pressure 125/79, pulse 60, temperature 98.2 F (36.8 C), temperature source Oral, resp. rate 18, height 5\' 8"  (1.727 m), weight 94.2 kg (207 lb 11.2 oz), SpO2 91 %.  PHYSICAL EXAMINATION:   Physical Exam  GENERAL:  71 y.o.-year-old patient lying in bed in no acute distress.  EYES: Pupils equal, round, reactive to light and accommodation. No scleral icterus. Extraocular muscles intact.  HEENT: Head atraumatic, normocephalic. Oropharynx and nasopharynx clear.  NECK:  Supple, no jugular venous distention. No thyroid enlargement, no tenderness.  LUNGS: Normal breath sounds bilaterally, no wheezing, bibasliar rales, No  rhonchi. No use of accessory muscles of respiration.  CARDIOVASCULAR: S1, S2 Irregular and tachycardic. No murmurs, rubs, or gallops.  ABDOMEN: Soft, nontender, nondistended. Bowel sounds present. No organomegaly or mass.  EXTREMITIES: No cyanosis, clubbing or edema b/l.    NEUROLOGIC: Cranial nerves II through XII are intact. No focal Motor or sensory deficits b/l.   PSYCHIATRIC: The patient is alert and oriented x 3.  SKIN: No obvious rash, lesion, or ulcer.    LABORATORY PANEL:   CBC  Recent Labs Lab 10/11/16 0329  WBC 8.6  HGB 15.8  HCT 45.6  PLT 193   ------------------------------------------------------------------------------------------------------------------  Chemistries   Recent Labs Lab 10/07/16 1054  10/11/16 0329  NA 140  < > 135  K 3.7  < > 3.8  CL 103  < > 96*  CO2 23  < > 32  GLUCOSE 146*  < > 109*  BUN 10  < > 12  CREATININE 0.84  < > 0.88  CALCIUM 8.6*  < > 8.8*  MG  --   < > 1.5*  AST 32  --   --   ALT 20  --   --   ALKPHOS 74  --   --   BILITOT 0.7  --   --   < > = values in this interval not displayed. ------------------------------------------------------------------------------------------------------------------  Cardiac Enzymes  Recent Labs Lab 10/07/16 2332  TROPONINI <0.03   ------------------------------------------------------------------------------------------------------------------  RADIOLOGY:  Mr Thoracic Spine Wo Contrast  Result Date: 10/09/2016 CLINICAL DATA:  T12 fracture EXAM: MRI THORACIC SPINE WITHOUT CONTRAST TECHNIQUE: Multiplanar, multisequence MR imaging of the thoracic spine was performed. No intravenous contrast  was administered. COMPARISON:  Lumbar spine radiograph 10/07/2016 FINDINGS: Alignment:  No static subluxation Vertebrae: There are multiple thoracic compression deformities without significant edema, likely chronic. Cord:  Normal signal and caliber Paraspinal and other soft tissues: Negative. Disc  levels: T3-T4:  Small central disc protrusion without stenosis. T5-T6:  Small central disc protrusion without stenosis. The other thoracic disc levels show no focal herniation, spinal canal stenosis or neural foraminal stenosis. 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 No spinal canal or neural foraminal stenosis. Electronically Signed   By: Ulyses Jarred M.D.   On: 10/09/2016 18:39     ASSESSMENT AND PLAN:   71 year old male with past medical history of hypertension, hyperlipidemia, GERD, chronic atrial fibrillation, BPH, obstructive sleep apnea on presented to the hospital after a fall and noted to be tachycardic.  1. Atrial fibrillation with rapid ventricular response-patient's heart rates are still somewhat labile. -Continue amiodarone, Cardizem, metoprolol. Discussed with Cardiology and will load Digoxin.   -cont. Eliquis.  - Echo showing normal EF.   2. CHF -  Acute on chronic diastolic CHF.  - cont. Diuresis with IV Lasix and follow I's and O's and daily weights.  - About 2 L (-) since admission.   - cont. Lisinopril, Metoprolol.   3. Hx of Gout - no acute attack.  - cont. Allopurinol  4. Restless Leg syndrome - cont. Requip  5.  Hypertension - cont. Lisinopril, Cardizem, Toprol.   Possible d/c home tomorrow if HR is improved.    All the records are reviewed and case discussed with Care Management/Social Worker. Management plans discussed with the patient, family and they are in agreement.  CODE STATUS: Full code  DVT Prophylaxis: Eliquis  TOTAL TIME TAKING CARE OF THIS PATIENT: 25 minutes.   POSSIBLE D/C IN 1-2 DAYS, DEPENDING ON CLINICAL CONDITION.   Henreitta Leber M.D on 10/11/2016 at 11:47 AM  Between 7am to 6pm - Pager - [phone]  After 6pm go to [email or web address] - Proofreader  [facility]  Office  [phone]  CC: Primary care physician; Tracie Harrier, MD

## 2016-10-12 LAB — BASIC METABOLIC PANEL
ANION GAP: 8 (ref 5–15)
BUN: 16 mg/dL (ref 6–20)
CHLORIDE: 95 mmol/L — AB (ref 101–111)
CO2: 31 mmol/L (ref 22–32)
Calcium: 8.7 mg/dL — ABNORMAL LOW (ref 8.9–10.3)
Creatinine, Ser: 1.07 mg/dL (ref 0.61–1.24)
GFR calc Af Amer: >60 mL/min (ref >60)
GFR calc non Af Amer: >60 mL/min (ref >60)
GLUCOSE: 103 mg/dL — AB (ref 65–99)
POTASSIUM: 3.7 mmol/L (ref 3.5–5.1)
Sodium: 134 mmol/L — ABNORMAL LOW (ref 135–145)

## 2016-10-12 LAB — MAGNESIUM: Magnesium: 1.7 mg/dL (ref 1.7–2.4)

## 2016-10-12 MED ORDER — AMIODARONE HCL 400 MG PO TABS: 400.0000 mg | ORAL_TABLET | Freq: Every day | ORAL | Status: DC

## 2016-10-12 MED ORDER — DIGOXIN 125 MCG PO TABS: 125.0000 ug | ORAL_TABLET | Freq: Every day | ORAL | 1 refills | Status: DC

## 2016-10-12 MED ORDER — MAGNESIUM SULFATE 2 GM/50ML IV SOLN
2.0000 g | Freq: Once | INTRAVENOUS | Status: AC
  Administered 2016-10-12: 2 g via INTRAVENOUS
  Filled 2016-10-12: qty 50

## 2016-10-12 NOTE — Progress Notes (Signed)
  Amiodarone Drug - Drug Interaction Consult Note  Recommendations: No significant drug interactions with amiodarone at this time. See below for potential interactions.    Amiodarone is metabolized by the cytochrome P450 system and therefore has the potential to cause many drug interactions. Amiodarone has an average plasma half-life of 50 days (range 20 to 100 days).   There is potential for drug interactions to occur several weeks or months after stopping treatment and the onset of drug interactions may be slow after initiating amiodarone.   []  Statins: Increased risk of myopathy. Simvastatin- restrict dose to 20mg  daily. Other statins: counsel patients to report any muscle pain or weakness immediately.  []  Anticoagulants: Amiodarone can increase anticoagulant effect. Consider warfarin dose reduction. Patients should be monitored closely and the dose of anticoagulant altered accordingly, remembering that amiodarone levels take several weeks to stabilize.  []  Antiepileptics: Amiodarone can increase plasma concentration of phenytoin, the dose should be reduced. Note that small changes in phenytoin dose can result in large changes in levels. Monitor patient and counsel on signs of toxicity.  [x]  Beta blockers: increased risk of bradycardia, AV block and myocardial depression. Sotalol - avoid concomitant use.  Patient on Metoprolol XL  [x]   Calcium channel blockers (diltiazem and verapamil): increased risk of bradycardia, AV block and myocardial depression.   Patient on Diltiazem.  []   Cyclosporine: Amiodarone increases levels of cyclosporine. Reduced dose of cyclosporine is recommended.  [x]  Digoxin dose should be halved when amiodarone is started.   Patient received doses of Digoxin on 7/1 but not currently ordered on 10/12/16.  [x]  Diuretics: increased risk of cardiotoxicity if hypokalemia occurs.  Patient on Lasix.  []  Oral hypoglycemic agents (glyburide, glipizide, glimepiride):  increased risk of hypoglycemia. Patient's glucose levels should be monitored closely when initiating amiodarone therapy.   []  Drugs that prolong the QT interval:  Torsades de pointes risk may be increased with concurrent use - avoid if possible.  Monitor QTc, also keep magnesium/potassium WNL if concurrent therapy can't be avoided. Marland Kitchen Antibiotics: e.g. fluoroquinolones, erythromycin. . Antiarrhythmics: e.g. quinidine, procainamide, disopyramide, sotalol. . Antipsychotics: e.g. phenothiazines, haloperidol.  . Lithium, tricyclic antidepressants, and methadone.  Thank You,  Paxson Harrower A  10/12/2016 12:35 PM

## 2016-10-12 NOTE — Progress Notes (Signed)
IV and tele removed from patient. Oxygen delivered to patient. Discharge instructions given to patient along with hard copy prescription. Verbalized understanding. No distress at this time. Family member at bedside and will transport patient home.

## 2016-10-12 NOTE — Progress Notes (Signed)
Pharmacy Consult for Electrolyte Monitoring Indication: Hypokalemia and Hypomagnesemia  Allergies  Allergen Reactions  . Oxycodone Other (See Comments)    Hallucinations  . Cardura [Doxazosin Mesylate]   . Darvon [Propoxyphene]   . Penicillins   . Requip [Ropinirole Hcl]   . Septra [Sulfamethoxazole-Trimethoprim]     Patient Measurements: Height: 5\' 8"  (172.7 cm) Weight: 207 lb 11.2 oz (94.2 kg) IBW/kg (Calculated) : 68.4  Vital Signs: Temp: 97.8 F (36.6 C) (07/02 0425) Temp Source: Oral (07/02 0425) BP: 166/78 (07/02 0425) Pulse Rate: 72 (07/02 0425) Intake/Output from previous day: 07/01 0701 - 07/02 0700 In: 480 [P.O.:480] Out: 2775 [Urine:2775] Intake/Output from this shift: No intake/output data recorded.  Labs:  Recent Labs  10/10/16 0326 10/11/16 0329 10/12/16 0411  WBC  --  8.6  --   HGB  --  15.8  --   HCT  --  45.6  --   PLT  --  193  --   CREATININE 0.82 0.88 1.07  MG  --  1.5* 1.7   Estimated Creatinine Clearance: 69.5 mL/min (by C-G formula based on SCr of 1.07 mg/dL).  Potassium (mmol/L)  Date Value  10/12/2016 3.7  02/09/2012 3.4 (L)   Calcium (mg/dL)  Date Value  10/12/2016 8.7 (L)   Calcium, Total (mg/dL)  Date Value  02/09/2012 8.8   Sodium (mmol/L)  Date Value  10/12/2016 134 (L)  02/09/2012 141     Medical History: Past Medical History:  Diagnosis Date  . A-fib ([place])   . Benign prostatic hyperplasia   . Cardiomyopathy, secondary ([place])   . GERD (gastroesophageal reflux disease)   . Gout   . Hyperlipidemia   . Hypertension   . Obesity   . Sleep apnea     Assessment: 71 y/o M with a h/o atrial fibrillation, congestive heart failure, and alcohol abuse admitted with atrial fibrillation with rapid ventricular response. Patient is on scheduled Lasix and oral potassium supplementation.   Plan:  Will order magnesium sulfate 4 g iv once and KCl 40 meq po once (in addition to scheduled KCl) and f/u AM labs.   6/29 AM  K+, Mg WNL. F/u BMP with tomorrow AM labs.  6/30 BMP. No indication for electrolyte replacement. F/u BMP, Mg in AM.  7/1 AM Mg 1.5. 2 grams magnesium sulfate IV ordered. Recheck BMP and Mg tomorrow AM.  7/2  K 3.7, Mag 1.7. Patient on KCL PO 20 meq BID and Lasix 40mg  IV BID. Mag is borderline low. Will give Magnesium 2 gram IV x 1. F/u  Labs .  Alya Smaltz A 10/12/2016,7:28 AM

## 2016-10-12 NOTE — Discharge Summary (Addendum)
[facility] NAME: Brendan Edwards    MR#:  [id number]  DATE OF BIRTH:  Dec 30, 1945  DATE OF ADMISSION:  10/07/2016 ADMITTING PHYSICIAN: Vaughan Basta, MD  DATE OF DISCHARGE: 10/12/2016  PRIMARY CARE PHYSICIAN: Tracie Harrier, MD    ADMISSION DIAGNOSIS:  Atrial fibrillation with RVR ([place]) [I48.91]  DISCHARGE DIAGNOSIS:  Principal Problem:   Atrial fibrillation with RVR ([place])   SECONDARY DIAGNOSIS:   Past Medical History:  Diagnosis Date  . A-fib ([place])   . Benign prostatic hyperplasia   . Cardiomyopathy, secondary ([place])   . GERD (gastroesophageal reflux disease)   . Gout   . Hyperlipidemia   . Hypertension   . Obesity   . Sleep apnea     HOSPITAL COURSE:   71 year old male with past medical history of hypertension, hyperlipidemia, GERD, chronic atrial fibrillation, BPH, obstructive sleep apnea on presented to the hospital after a fall and noted to be tachycardic.  1. Atrial fibrillation with rapid ventricular response-Patient was admitted to the hospital and started on oral amiodarone loading and also maintained on calcium channel blockers and beta blockers. Despite that patient continues to have labile heart rates. Patient was loaded with digoxin. -After being loaded with digoxin and continuing other maintenance meds patient's heart rate has improved and he is presently in sinus rhythm. He is being discharged home on oral amiodarone, Cardizem, digoxin with close follow-up with cardiology as an outpatient.  He is somewhat bradycardic and therefore I am discontinuing his Toprol until he sees cardiology.  -His echo showed normal ejection fraction and he will continue oral Eliquis for anticoagulation.  2. CHF -  Acute on chronic diastolic CHF.  -Patient was aggressively diuresed with IV Lasix and responded well and is about 4 L negative since admission. -He will continue oral Lasix at home. He was ambulated and still  remained hypoxic despite being diuresed and therefore is being discharged home oxygen.  - he will cont. Lisinopril, Cardizem.   3. Hx of Gout - no acute attack.  - he will cont. Allopurinol  4. Restless Leg syndrome - he will cont. Requip  5.  Hypertension - he will cont. Lisinopril, Cardizem.  Toprol discontinued due to some relative bradycardia.   Pt. Is being discharged home with Home Health services.   DISCHARGE CONDITIONS:   Stable.   CONSULTS OBTAINED:  Treatment Team:  Teodoro Spray, MD  DRUG ALLERGIES:   Allergies  Allergen Reactions  . Oxycodone Other (See Comments)    Hallucinations  . Cardura [Doxazosin Mesylate]   . Darvon [Propoxyphene]   . Penicillins   . Requip [Ropinirole Hcl]   . Septra [Sulfamethoxazole-Trimethoprim]     DISCHARGE MEDICATIONS:   Allergies as of 10/12/2016      Reactions   Oxycodone Other (See Comments)   Hallucinations   Cardura [doxazosin Mesylate]    Darvon [propoxyphene]    Penicillins    Requip [ropinirole Hcl]    Septra [sulfamethoxazole-trimethoprim]       Medication List    STOP taking these medications   metoprolol succinate 100 MG 24 hr tablet Commonly known as:  TOPROL-XL     TAKE these medications   allopurinol 300 MG tablet Commonly known as:  ZYLOPRIM Take 300 mg by mouth daily.   amiodarone 400 MG tablet Commonly known as:  PACERONE Take 1 tablet (400 mg total) by mouth daily.   apixaban 5 MG Tabs tablet Commonly known as:  ELIQUIS Take 5  mg by mouth 2 (two) times daily.   benzocaine 10 % mucosal gel Commonly known as:  ORAJEL Use as directed in the mouth or throat 4 (four) times daily as needed for mouth pain.   digoxin 0.125 MG tablet Commonly known as:  LANOXIN Take 1 tablet (125 mcg total) by mouth daily.   diltiazem 360 MG 24 hr capsule Commonly known as:  CARDIZEM CD Take 1 capsule (360 mg total) by mouth daily.   folic acid 1 MG tablet Commonly known as:  FOLVITE Take 1 tablet (1  mg total) by mouth daily.   furosemide 40 MG tablet Commonly known as:  LASIX Take 40 mg by mouth daily.   ibuprofen 100 MG/5ML suspension Commonly known as:  ADVIL,MOTRIN Take 200 mg by mouth every 4 (four) hours as needed.   lisinopril 5 MG tablet Commonly known as:  PRINIVIL,ZESTRIL Take 5 mg by mouth daily.   multivitamin tablet Take by mouth daily.   potassium chloride SA 20 MEQ tablet Commonly known as:  K-DUR,KLOR-CON Take 20 mEq by mouth 2 (two) times daily.   rOPINIRole 0.5 MG tablet Commonly known as:  REQUIP Take 0.5 mg by mouth 3 (three) times daily.   temazepam 30 MG capsule Commonly known as:  RESTORIL Take 30 mg by mouth at bedtime as needed for sleep.   thiamine 50 MG tablet Take 1 tablet (50 mg total) by mouth daily.            Durable Medical Equipment        Start     Ordered   10/12/16 1423  For home use only DME Walker rolling  Once    Question:  Patient needs a walker to treat with the following condition  Answer:  CHF (congestive heart failure) ([place])   10/12/16 1422   10/12/16 1422  For home use only DME oxygen  Once    Question Answer Comment  Mode or (Route) Nasal cannula   Liters per Minute 2   Frequency Continuous (stationary and portable oxygen unit needed)   Oxygen conserving device Yes   Oxygen delivery system Gas      10/12/16 1421        DISCHARGE INSTRUCTIONS:   DIET:  Cardiac diet  DISCHARGE CONDITION:  Stable  ACTIVITY:  Activity as tolerated  OXYGEN:  Home Oxygen: Yes.     Oxygen Delivery: 2 liters/min via Patient connected to nasal cannula oxygen  DISCHARGE LOCATION:  Home with Home Health RN.    If you experience worsening of your admission symptoms, develop shortness of breath, life threatening emergency, suicidal or homicidal thoughts you must seek medical attention immediately by calling 911 or calling your MD immediately  if symptoms less severe.  You Must read complete instructions/literature  along with all the possible adverse reactions/side effects for all the Medicines you take and that have been prescribed to you. Take any new Medicines after you have completely understood and accpet all the possible adverse reactions/side effects.   Please note  You were cared for by a hospitalist during your hospital stay. If you have any questions about your discharge medications or the care you received while you were in the hospital after you are discharged, you can call the unit and asked to speak with the hospitalist on call if the hospitalist that took care of you is not available. Once you are discharged, your primary care physician will handle any further medical issues. Please note that NO REFILLS for any  discharge medications will be authorized once you are discharged, as it is imperative that you return to your primary care physician (or establish a relationship with a primary care physician if you do not have one) for your aftercare needs so that they can reassess your need for medications and monitor your lab values.     Today   Shortness of breath improved. Feels somewhat dizzy when ambulating. Not orthostatic. Heart rates are significantly improved now converted to sinus rhythm.    VITAL SIGNS:  Blood pressure (!) 126/58, pulse (!) 56, temperature 97.6 F (36.4 C), temperature source Oral, resp. rate 18, height 5\' 8"  (1.727 m), weight 94.2 kg (207 lb 11.2 oz), SpO2 96 %.  I/O:    Intake/Output Summary (Last 24 hours) at 10/12/16 1449 Last data filed at 10/12/16 1357  Gross per 24 hour  Intake              840 ml  Output             2625 ml  Net            -1785 ml    PHYSICAL EXAMINATION:  GENERAL:  71 y.o.-year-old patient lying in the bed with no acute distress.  EYES: Pupils equal, round, reactive to light and accommodation. No scleral icterus. Extraocular muscles intact.  HEENT: Head atraumatic, normocephalic. Oropharynx and nasopharynx clear.  NECK:  Supple, no  jugular venous distention. No thyroid enlargement, no tenderness.  LUNGS: Normal breath sounds bilaterally, no wheezing, rales,rhonchi. No use of accessory muscles of respiration.  CARDIOVASCULAR: S1, S2 normal. No murmurs, rubs, or gallops.  ABDOMEN: Soft, non-tender, non-distended. Bowel sounds present. No organomegaly or mass.  EXTREMITIES: No pedal edema, cyanosis, or clubbing.  NEUROLOGIC: Cranial nerves II through XII are intact. No focal motor or sensory defecits b/l.  PSYCHIATRIC: The patient is alert and oriented x 3.   SKIN: No obvious rash, lesion, or ulcer.   DATA REVIEW:   CBC  Recent Labs Lab 10/11/16 0329  WBC 8.6  HGB 15.8  HCT 45.6  PLT 193    Chemistries   Recent Labs Lab 10/07/16 1054  10/12/16 0411  NA 140  < > 134*  K 3.7  < > 3.7  CL 103  < > 95*  CO2 23  < > 31  GLUCOSE 146*  < > 103*  BUN 10  < > 16  CREATININE 0.84  < > 1.07  CALCIUM 8.6*  < > 8.7*  MG  --   < > 1.7  AST 32  --   --   ALT 20  --   --   ALKPHOS 74  --   --   BILITOT 0.7  --   --   < > = values in this interval not displayed.  Cardiac Enzymes  Recent Labs Lab 10/07/16 2332  TROPONINI <0.03    Microbiology Results  Results for orders placed or performed during the hospital encounter of 10/07/16  MRSA PCR Screening     Status: None   Collection Time: 10/07/16  6:42 PM  Result Value Ref Range Status   MRSA by PCR NEGATIVE NEGATIVE Final    Comment:        The GeneXpert MRSA Assay (FDA approved for NASAL specimens only), is one component of a comprehensive MRSA colonization surveillance program. It is not intended to diagnose MRSA infection nor to guide or monitor treatment for MRSA infections.     RADIOLOGY:  No results  found.    Management plans discussed with the patient, family and they are in agreement.  CODE STATUS:     Code Status Orders        Start     Ordered   10/07/16 1640  Full code  Continuous     10/07/16 1639    Code Status  History    Date Active Date Inactive Code Status Order ID Comments User Context   08/10/2016  8:17 PM 08/20/2016  3:17 PM Full Code [id number]  Theodoro Grist, MD ED      TOTAL TIME TAKING CARE OF THIS PATIENT: 40 minutes.    Henreitta Leber M.D on 10/12/2016 at 2:49 PM  Between 7am to 6pm - Pager - [phone]  After 6pm go to [email or web address] - Proofreader  [facility]  Office  [phone]  CC: Primary care physician; Tracie Harrier, MD

## 2016-10-12 NOTE — Progress Notes (Signed)
Physical Therapy Treatment Patient Details Name: Brendan Edwards MRN: [id number] DOB: May 08, 1945 Today's Date: 10/12/2016    History of Present Illness Pt is a 71 yo M, admitted to CCU following a fall and tachycardia with dx of Afib  with RVR on 10/07/16. Prior to admission pt modI with all ADL's and IADL's; PLOF includes driving and working at a [employer]. PMH includes: Afib, benign prostatic hyperplasia, cardiomyopathy, GERD, gout, HLD, HTN, obesity, sleep apnea, and etoh abuse.     PT Comments    Pt is a pleasant 71 y.o. M, admitted to acute care following a fall  and tachycardia with dx of Afib. Pt performs bed mobility and tranfers with supervision, secondary to impulsivity and strength. Ambulation with RW and 2 L supplemental O2, requiring CGA, due to intermittent c/o light headedness. Pt demo good reciprocal gait pattern with amb; amb a total of 300 ft, with bathroom break after 20 ft, rest break after additional 20 (for SPT to assess vitals), and rest break after 150 ft due to c/o light-headedness. Required min verbal cues with amb for maintaining close proximity to RW. Pt O2 saturation maintained above 90% on 2 supplemental O2 upon exertion, and HR maintained between 75-87 BPM upon exertion. SaO2 on room air at rest = 89 %; SaO2 on room air while ambulating = 85%; SaO2 on 2 liters of O2 while ambulating = 95%. Pt presents with the following deficits: strength and endurance. Overall, pt responded well to today's treatment with no adverse affects, and is progressing well towards functional goals. Pt would benefit from continued skilled PT to address the previously mentioned impairments and promote return to PLOF. Currently recommending outpatient PT, pending d/c.    Follow Up Recommendations  Outpatient PT     Equipment Recommendations  Rolling walker with 5" wheels    Recommendations for Other Services       Precautions / Restrictions Precautions Precautions:  Fall Restrictions Weight Bearing Restrictions: No    Mobility  Bed Mobility Overal bed mobility: Needs Assistance Bed Mobility: Supine to Sit     Supine to sit: Supervision     General bed mobility comments: HOB raised and railing used. Required supervision, due to impulsivity and intermittent reports of light headedness with activity.   Transfers Overall transfer level: Needs assistance Equipment used: Rolling walker (2 wheeled) Transfers: Sit to/from Stand Sit to Stand: Supervision         General transfer comment: Supervision with STS due to impulsivity and intermittent reports of "light headedness."  Ambulation/Gait Ambulation/Gait assistance: Min guard Ambulation Distance (Feet): 300 Feet Assistive device: Rolling walker (2 wheeled) Gait Pattern/deviations: Step-through pattern     General Gait Details: Pt demo good reciprical gait pattern using RW and 2 L supplemental O2. Required CGA due to c/o light-headedness after 180 ft. O2 sats remained >90% with supplemental O2. Pt amb 100 ft on RA, and saturation dropped to 85%. Pt recovered quickly with 2 L O2, and cues to breath through nose.    Stairs            Wheelchair Mobility    Modified Rankin (Stroke Patients Only)       Balance                                            Cognition Arousal/Alertness: Awake/alert Behavior During Therapy: WFL for tasks assessed/performed;Agitated Overall  Cognitive Status: Within Functional Limits for tasks assessed                                 General Comments: Sllightly agitaed, but in better spirits today. Cooporative.       Exercises Other Exercises Other Exercises: Seated therex performed to B LE's with supervsion: marches, LAQ's, and glute sets. Pt education re: managemetn of nasal cannula during amb. Other Exercises: toileting: performed clothing management and hygiene with CGA assist, dude to impaired LE strength and  impulsivity. Pt with BM.    General Comments        Pertinent Vitals/Pain Pain Assessment: No/denies pain Pain Location: low back ("slight pain with movement.") Pain Intervention(s): Limited activity within patient's tolerance;Monitored during session    Home Living                      Prior Function            PT Goals (current goals can now be found in the care plan section) Acute Rehab PT Goals Patient Stated Goal: to go back to work  PT Goal Formulation: With patient Time For Goal Achievement: 10/22/16 Potential to Achieve Goals: Good Progress towards PT goals: Progressing toward goals    Frequency    Min 2X/week      PT Plan Current plan remains appropriate    Co-evaluation              AM-PAC PT "6 Clicks" Daily Activity  Outcome Measure  Difficulty turning over in bed (including adjusting bedclothes, sheets and blankets)?: A Little Difficulty moving from lying on back to sitting on the side of the bed? : A Little Difficulty sitting down on and standing up from a chair with arms (e.g., wheelchair, bedside commode, etc,.)?: A Little Help needed moving to and from a bed to chair (including a wheelchair)?: A Little Help needed walking in hospital room?: A Little Help needed climbing 3-5 steps with a railing? : A Lot 6 Click Score: 17    End of Session Equipment Utilized During Treatment: Gait belt Activity Tolerance: Patient tolerated treatment well Patient left: in chair;with call bell/phone within reach;with chair alarm set Nurse Communication: Mobility status PT Visit Diagnosis: Unsteadiness on feet (R26.81);History of falling (Z91.81);Muscle weakness (generalized) (M62.81);Pain     Time: 0263-7858 PT Time Calculation (min) (ACUTE ONLY): 32 min  Charges:                       G Codes:       Oran Rein PT, SPT   Bevelyn Ngo 10/12/2016, 11:29 AM

## 2016-10-12 NOTE — Progress Notes (Signed)
SATURATION QUALIFICATIONS: (This note is used to comply with regulatory documentation for home oxygen)  Patient Saturations on Room Air at Rest = 94%  Patient Saturations on Room Air while Ambulating = 87%  Patient Saturations on 2 Liters of oxygen while Ambulating = 93%  Please briefly explain why patient needs home oxygen: 

## 2016-10-12 NOTE — Care Management Important Message (Signed)
Important Message  Patient Details  Name: Brendan Edwards MRN: [id number] Date of Birth: 08-19-1945   Medicare Important Message Given:  Yes    Beverly Sessions, RN 10/12/2016, 4:30 PM

## 2016-10-12 NOTE — Care Management (Signed)
Patient discharged home today.  Patient qualified for home O2 at time of discharge.  Home health orders were written for home health nursing.  Patient provided home health agency preference, and notified patient that his previous home health agency was [facility].  Patient states "I don't care who you use as long as they are all from the same place".  I informed the patient that he could receive his O2 from [facility], and still recevie his nursing services through [facility].  Patient states that he wishes for both to come from [place].  Referral made to [facility] with [facility], portable O2 tank delivered prior to discharge.  RNCM signing off

## 2016-10-13 DIAGNOSIS — I4891 Unspecified atrial fibrillation: Secondary | ICD-10-CM | POA: Diagnosis not present

## 2016-10-13 DIAGNOSIS — I119 Hypertensive heart disease without heart failure: Secondary | ICD-10-CM | POA: Diagnosis not present

## 2016-10-13 DIAGNOSIS — I429 Cardiomyopathy, unspecified: Secondary | ICD-10-CM | POA: Diagnosis not present

## 2016-10-15 DIAGNOSIS — Z9981 Dependence on supplemental oxygen: Secondary | ICD-10-CM | POA: Diagnosis not present

## 2016-10-15 DIAGNOSIS — E669 Obesity, unspecified: Secondary | ICD-10-CM | POA: Diagnosis not present

## 2016-10-15 DIAGNOSIS — I5033 Acute on chronic diastolic (congestive) heart failure: Secondary | ICD-10-CM | POA: Diagnosis not present

## 2016-10-15 DIAGNOSIS — I429 Cardiomyopathy, unspecified: Secondary | ICD-10-CM | POA: Diagnosis not present

## 2016-10-15 DIAGNOSIS — G2581 Restless legs syndrome: Secondary | ICD-10-CM | POA: Diagnosis not present

## 2016-10-15 DIAGNOSIS — I482 Chronic atrial fibrillation: Secondary | ICD-10-CM | POA: Diagnosis not present

## 2016-10-15 DIAGNOSIS — Z9181 History of falling: Secondary | ICD-10-CM | POA: Diagnosis not present

## 2016-10-15 DIAGNOSIS — E785 Hyperlipidemia, unspecified: Secondary | ICD-10-CM | POA: Diagnosis not present

## 2016-10-15 DIAGNOSIS — N400 Enlarged prostate without lower urinary tract symptoms: Secondary | ICD-10-CM | POA: Diagnosis not present

## 2016-10-15 DIAGNOSIS — K219 Gastro-esophageal reflux disease without esophagitis: Secondary | ICD-10-CM | POA: Diagnosis not present

## 2016-10-15 DIAGNOSIS — G4730 Sleep apnea, unspecified: Secondary | ICD-10-CM | POA: Diagnosis not present

## 2016-10-15 DIAGNOSIS — F1010 Alcohol abuse, uncomplicated: Secondary | ICD-10-CM | POA: Diagnosis not present

## 2016-10-15 DIAGNOSIS — F17210 Nicotine dependence, cigarettes, uncomplicated: Secondary | ICD-10-CM | POA: Diagnosis not present

## 2016-10-15 DIAGNOSIS — I110 Hypertensive heart disease with heart failure: Secondary | ICD-10-CM | POA: Diagnosis not present

## 2016-10-15 DIAGNOSIS — J449 Chronic obstructive pulmonary disease, unspecified: Secondary | ICD-10-CM | POA: Diagnosis not present

## 2016-10-15 DIAGNOSIS — R296 Repeated falls: Secondary | ICD-10-CM | POA: Diagnosis not present

## 2016-10-15 DIAGNOSIS — M109 Gout, unspecified: Secondary | ICD-10-CM | POA: Diagnosis not present

## 2016-10-30 DIAGNOSIS — I5033 Acute on chronic diastolic (congestive) heart failure: Secondary | ICD-10-CM | POA: Diagnosis not present

## 2016-10-30 DIAGNOSIS — I110 Hypertensive heart disease with heart failure: Secondary | ICD-10-CM | POA: Diagnosis not present

## 2016-10-30 DIAGNOSIS — I482 Chronic atrial fibrillation: Secondary | ICD-10-CM | POA: Diagnosis not present

## 2016-10-30 DIAGNOSIS — J449 Chronic obstructive pulmonary disease, unspecified: Secondary | ICD-10-CM | POA: Diagnosis not present

## 2016-11-13 DIAGNOSIS — I4891 Unspecified atrial fibrillation: Secondary | ICD-10-CM | POA: Diagnosis not present

## 2016-11-13 DIAGNOSIS — I119 Hypertensive heart disease without heart failure: Secondary | ICD-10-CM | POA: Diagnosis not present

## 2016-11-13 DIAGNOSIS — I429 Cardiomyopathy, unspecified: Secondary | ICD-10-CM | POA: Diagnosis not present

## 2016-12-10 DIAGNOSIS — G4733 Obstructive sleep apnea (adult) (pediatric): Secondary | ICD-10-CM | POA: Diagnosis not present

## 2016-12-10 DIAGNOSIS — I420 Dilated cardiomyopathy: Secondary | ICD-10-CM | POA: Diagnosis not present

## 2016-12-10 DIAGNOSIS — I480 Paroxysmal atrial fibrillation: Secondary | ICD-10-CM | POA: Diagnosis not present

## 2016-12-10 DIAGNOSIS — I10 Essential (primary) hypertension: Secondary | ICD-10-CM | POA: Diagnosis not present

## 2016-12-10 DIAGNOSIS — I5022 Chronic systolic (congestive) heart failure: Secondary | ICD-10-CM | POA: Diagnosis not present

## 2016-12-14 DIAGNOSIS — I119 Hypertensive heart disease without heart failure: Secondary | ICD-10-CM | POA: Diagnosis not present

## 2016-12-14 DIAGNOSIS — I4891 Unspecified atrial fibrillation: Secondary | ICD-10-CM | POA: Diagnosis not present

## 2016-12-14 DIAGNOSIS — I429 Cardiomyopathy, unspecified: Secondary | ICD-10-CM | POA: Diagnosis not present

## 2017-01-13 DIAGNOSIS — I119 Hypertensive heart disease without heart failure: Secondary | ICD-10-CM | POA: Diagnosis not present

## 2017-01-13 DIAGNOSIS — I4891 Unspecified atrial fibrillation: Secondary | ICD-10-CM | POA: Diagnosis not present

## 2017-01-13 DIAGNOSIS — I429 Cardiomyopathy, unspecified: Secondary | ICD-10-CM | POA: Diagnosis not present

## 2017-01-27 DIAGNOSIS — G4733 Obstructive sleep apnea (adult) (pediatric): Secondary | ICD-10-CM | POA: Diagnosis not present

## 2017-01-27 DIAGNOSIS — I5022 Chronic systolic (congestive) heart failure: Secondary | ICD-10-CM | POA: Diagnosis not present

## 2017-01-27 DIAGNOSIS — I480 Paroxysmal atrial fibrillation: Secondary | ICD-10-CM | POA: Diagnosis not present

## 2017-01-27 DIAGNOSIS — G2581 Restless legs syndrome: Secondary | ICD-10-CM | POA: Diagnosis not present

## 2017-01-27 DIAGNOSIS — E119 Type 2 diabetes mellitus without complications: Secondary | ICD-10-CM | POA: Diagnosis not present

## 2017-01-27 DIAGNOSIS — Z0000 Encounter for general adult medical examination without abnormal findings: Secondary | ICD-10-CM | POA: Diagnosis not present

## 2017-02-03 DIAGNOSIS — I5022 Chronic systolic (congestive) heart failure: Secondary | ICD-10-CM | POA: Diagnosis not present

## 2017-02-03 DIAGNOSIS — Z8739 Personal history of other diseases of the musculoskeletal system and connective tissue: Secondary | ICD-10-CM | POA: Diagnosis not present

## 2017-02-03 DIAGNOSIS — Z0000 Encounter for general adult medical examination without abnormal findings: Secondary | ICD-10-CM | POA: Diagnosis not present

## 2017-02-03 DIAGNOSIS — I482 Chronic atrial fibrillation: Secondary | ICD-10-CM | POA: Diagnosis not present

## 2017-02-03 DIAGNOSIS — E876 Hypokalemia: Secondary | ICD-10-CM | POA: Diagnosis not present

## 2017-02-03 DIAGNOSIS — I10 Essential (primary) hypertension: Secondary | ICD-10-CM | POA: Diagnosis not present

## 2017-02-03 DIAGNOSIS — G4733 Obstructive sleep apnea (adult) (pediatric): Secondary | ICD-10-CM | POA: Diagnosis not present

## 2017-02-03 DIAGNOSIS — I420 Dilated cardiomyopathy: Secondary | ICD-10-CM | POA: Diagnosis not present

## 2017-02-13 DIAGNOSIS — I429 Cardiomyopathy, unspecified: Secondary | ICD-10-CM | POA: Diagnosis not present

## 2017-02-13 DIAGNOSIS — I119 Hypertensive heart disease without heart failure: Secondary | ICD-10-CM | POA: Diagnosis not present

## 2017-02-13 DIAGNOSIS — I4891 Unspecified atrial fibrillation: Secondary | ICD-10-CM | POA: Diagnosis not present

## 2017-03-15 DIAGNOSIS — I4891 Unspecified atrial fibrillation: Secondary | ICD-10-CM | POA: Diagnosis not present

## 2017-03-15 DIAGNOSIS — I429 Cardiomyopathy, unspecified: Secondary | ICD-10-CM | POA: Diagnosis not present

## 2017-03-15 DIAGNOSIS — I119 Hypertensive heart disease without heart failure: Secondary | ICD-10-CM | POA: Diagnosis not present

## 2017-04-15 DIAGNOSIS — I429 Cardiomyopathy, unspecified: Secondary | ICD-10-CM | POA: Diagnosis not present

## 2017-04-15 DIAGNOSIS — I119 Hypertensive heart disease without heart failure: Secondary | ICD-10-CM | POA: Diagnosis not present

## 2017-04-15 DIAGNOSIS — I4891 Unspecified atrial fibrillation: Secondary | ICD-10-CM | POA: Diagnosis not present

## 2017-04-28 DIAGNOSIS — Z716 Tobacco abuse counseling: Secondary | ICD-10-CM | POA: Diagnosis not present

## 2017-04-28 DIAGNOSIS — I5022 Chronic systolic (congestive) heart failure: Secondary | ICD-10-CM | POA: Diagnosis not present

## 2017-04-28 DIAGNOSIS — I10 Essential (primary) hypertension: Secondary | ICD-10-CM | POA: Diagnosis not present

## 2017-04-28 DIAGNOSIS — I420 Dilated cardiomyopathy: Secondary | ICD-10-CM | POA: Diagnosis not present

## 2017-04-28 DIAGNOSIS — G4733 Obstructive sleep apnea (adult) (pediatric): Secondary | ICD-10-CM | POA: Diagnosis not present

## 2017-04-28 DIAGNOSIS — E119 Type 2 diabetes mellitus without complications: Secondary | ICD-10-CM | POA: Diagnosis not present

## 2017-04-28 DIAGNOSIS — I482 Chronic atrial fibrillation: Secondary | ICD-10-CM | POA: Diagnosis not present

## 2017-05-10 ENCOUNTER — Emergency Department: Payer: PPO

## 2017-05-10 ENCOUNTER — Other Ambulatory Visit: Payer: Self-pay

## 2017-05-10 ENCOUNTER — Observation Stay
Admission: EM | Admit: 2017-05-10 | Discharge: 2017-05-13 | Disposition: A | Discharge status: Skilled Nursing Facility | Payer: PPO | Attending: Internal Medicine | Admitting: Internal Medicine

## 2017-05-10 DIAGNOSIS — E669 Obesity, unspecified: Secondary | ICD-10-CM | POA: Diagnosis not present

## 2017-05-10 DIAGNOSIS — Z880 Allergy status to penicillin: Secondary | ICD-10-CM | POA: Diagnosis not present

## 2017-05-10 DIAGNOSIS — Z885 Allergy status to narcotic agent status: Secondary | ICD-10-CM | POA: Insufficient documentation

## 2017-05-10 DIAGNOSIS — Z882 Allergy status to sulfonamides status: Secondary | ICD-10-CM | POA: Diagnosis not present

## 2017-05-10 DIAGNOSIS — M109 Gout, unspecified: Secondary | ICD-10-CM | POA: Insufficient documentation

## 2017-05-10 DIAGNOSIS — W19XXXA Unspecified fall, initial encounter: Secondary | ICD-10-CM | POA: Insufficient documentation

## 2017-05-10 DIAGNOSIS — Z794 Long term (current) use of insulin: Secondary | ICD-10-CM | POA: Diagnosis not present

## 2017-05-10 DIAGNOSIS — Z96643 Presence of artificial hip joint, bilateral: Secondary | ICD-10-CM | POA: Diagnosis not present

## 2017-05-10 DIAGNOSIS — M4856XA Collapsed vertebra, not elsewhere classified, lumbar region, initial encounter for fracture: Secondary | ICD-10-CM | POA: Diagnosis not present

## 2017-05-10 DIAGNOSIS — Z7901 Long term (current) use of anticoagulants: Secondary | ICD-10-CM | POA: Insufficient documentation

## 2017-05-10 DIAGNOSIS — X58XXXA Exposure to other specified factors, initial encounter: Secondary | ICD-10-CM | POA: Insufficient documentation

## 2017-05-10 DIAGNOSIS — F17290 Nicotine dependence, other tobacco product, uncomplicated: Secondary | ICD-10-CM | POA: Diagnosis not present

## 2017-05-10 DIAGNOSIS — I4891 Unspecified atrial fibrillation: Secondary | ICD-10-CM | POA: Diagnosis not present

## 2017-05-10 DIAGNOSIS — S32492A Other specified fracture of left acetabulum, initial encounter for closed fracture: Secondary | ICD-10-CM | POA: Diagnosis not present

## 2017-05-10 DIAGNOSIS — I10 Essential (primary) hypertension: Secondary | ICD-10-CM | POA: Insufficient documentation

## 2017-05-10 DIAGNOSIS — E785 Hyperlipidemia, unspecified: Secondary | ICD-10-CM | POA: Diagnosis not present

## 2017-05-10 DIAGNOSIS — E876 Hypokalemia: Secondary | ICD-10-CM | POA: Diagnosis not present

## 2017-05-10 DIAGNOSIS — S79912A Unspecified injury of left hip, initial encounter: Secondary | ICD-10-CM | POA: Diagnosis not present

## 2017-05-10 DIAGNOSIS — I482 Chronic atrial fibrillation: Secondary | ICD-10-CM | POA: Diagnosis not present

## 2017-05-10 DIAGNOSIS — K219 Gastro-esophageal reflux disease without esophagitis: Secondary | ICD-10-CM | POA: Insufficient documentation

## 2017-05-10 DIAGNOSIS — M25552 Pain in left hip: Secondary | ICD-10-CM

## 2017-05-10 DIAGNOSIS — Z8249 Family history of ischemic heart disease and other diseases of the circulatory system: Secondary | ICD-10-CM | POA: Insufficient documentation

## 2017-05-10 DIAGNOSIS — Y939 Activity, unspecified: Secondary | ICD-10-CM | POA: Insufficient documentation

## 2017-05-10 DIAGNOSIS — Z6831 Body mass index (BMI) 31.0-31.9, adult: Secondary | ICD-10-CM | POA: Diagnosis not present

## 2017-05-10 DIAGNOSIS — I429 Cardiomyopathy, unspecified: Secondary | ICD-10-CM | POA: Insufficient documentation

## 2017-05-10 DIAGNOSIS — Z79899 Other long term (current) drug therapy: Secondary | ICD-10-CM | POA: Insufficient documentation

## 2017-05-10 DIAGNOSIS — I700 Atherosclerosis of aorta: Secondary | ICD-10-CM | POA: Insufficient documentation

## 2017-05-10 DIAGNOSIS — E119 Type 2 diabetes mellitus without complications: Secondary | ICD-10-CM | POA: Diagnosis not present

## 2017-05-10 DIAGNOSIS — Z888 Allergy status to other drugs, medicaments and biological substances status: Secondary | ICD-10-CM | POA: Diagnosis not present

## 2017-05-10 DIAGNOSIS — K7010 Alcoholic hepatitis without ascites: Secondary | ICD-10-CM | POA: Insufficient documentation

## 2017-05-10 DIAGNOSIS — G4730 Sleep apnea, unspecified: Secondary | ICD-10-CM | POA: Insufficient documentation

## 2017-05-10 DIAGNOSIS — N400 Enlarged prostate without lower urinary tract symptoms: Secondary | ICD-10-CM | POA: Insufficient documentation

## 2017-05-10 DIAGNOSIS — R000 Tachycardia, unspecified: Secondary | ICD-10-CM | POA: Diagnosis not present

## 2017-05-10 LAB — CBC
HCT: 51.4 % (ref 40.0–52.0)
Hemoglobin: 17.3 g/dL (ref 13.0–18.0)
MCH: 35.1 pg — AB (ref 26.0–34.0)
MCHC: 33.6 g/dL (ref 32.0–36.0)
MCV: 104.5 fL — AB (ref 80.0–100.0)
PLATELETS: 229 10*3/uL (ref 150–440)
RBC: 4.92 MIL/uL (ref 4.40–5.90)
RDW: 14.9 % — AB (ref 11.5–14.5)
WBC: 9.2 10*3/uL (ref 3.8–10.6)

## 2017-05-10 LAB — BASIC METABOLIC PANEL
Anion gap: 11 (ref 5–15)
BUN: 14 mg/dL (ref 6–20)
CO2: 27 mmol/L (ref 22–32)
CREATININE: 0.83 mg/dL (ref 0.61–1.24)
Calcium: 8.7 mg/dL — ABNORMAL LOW (ref 8.9–10.3)
Chloride: 99 mmol/L — ABNORMAL LOW (ref 101–111)
GFR calc Af Amer: >60 mL/min (ref >60)
GFR calc non Af Amer: >60 mL/min (ref >60)
Glucose, Bld: 142 mg/dL — ABNORMAL HIGH (ref 65–99)
Potassium: 3.8 mmol/L (ref 3.5–5.1)
Sodium: 137 mmol/L (ref 135–145)

## 2017-05-10 LAB — TROPONIN I: Troponin I: <0.03 ng/mL (ref <0.03)

## 2017-05-10 MED ORDER — MORPHINE SULFATE (PF) 4 MG/ML IV SOLN
4.0000 mg | Freq: Once | INTRAVENOUS | Status: AC
  Administered 2017-05-10: 4 mg via INTRAVENOUS

## 2017-05-10 MED ORDER — DILTIAZEM HCL 100 MG IV SOLR
5.0000 mg/h | Freq: Once | INTRAVENOUS | Status: AC
  Administered 2017-05-10: 5 mg/h via INTRAVENOUS
  Filled 2017-05-10: qty 100

## 2017-05-10 MED ORDER — DILTIAZEM HCL 25 MG/5ML IV SOLN
15.0000 mg | Freq: Once | INTRAVENOUS | Status: AC
  Administered 2017-05-10: 15 mg via INTRAVENOUS
  Filled 2017-05-10: qty 5

## 2017-05-10 MED ORDER — SODIUM CHLORIDE 0.9 % IV BOLUS (SEPSIS)
500.0000 mL | Freq: Once | INTRAVENOUS | Status: AC
  Administered 2017-05-10: 500 mL via INTRAVENOUS

## 2017-05-10 MED ORDER — MORPHINE SULFATE (PF) 4 MG/ML IV SOLN
INTRAVENOUS | Status: AC
  Filled 2017-05-10: qty 1

## 2017-05-10 NOTE — ED Notes (Signed)
Pt voided on self - pt given blue pants and dried/changed

## 2017-05-10 NOTE — ED Provider Notes (Signed)
[facility] Emergency Department Provider Note   ____________________________________________   I have reviewed the triage vital signs and the nursing notes.   HISTORY  Chief Complaint Fall; Hip Pain; and Atrial Fibrillation   History limited by: Not Limited   HPI Brendan Edwards is a 72 y.o. male who presents to the emergency department today because of concern for left hip pain. It started 3 days ago after a fall. The patient states that sometimes when he stands up to fast he gets dizzy and falls down. He denies any other injury. Since then he has had pain in his hip with movement. Denies any pain at rest. When EMS arrived the patient was found to be in afib with rvr. Patient has history of afib. Has not been having any chest pain or palpitations. Denies any recent fevers.    Per medical record review patient has a history of Afib, bilateral hip replacement.   Past Medical History:  Diagnosis Date  . A-fib ([place])   . Benign prostatic hyperplasia   . Cardiomyopathy, secondary ([place])   . GERD (gastroesophageal reflux disease)   . Gout   . Hyperlipidemia   . Hypertension   . Obesity   . Sleep apnea     Patient Active Problem List   Diagnosis Date Noted  . Atrial fibrillation with RVR ([place]) 08/10/2016  . Diabetes ([place]) 08/10/2016  . Leukocytosis 08/10/2016  . Fall 08/10/2016  . Alcoholic hepatitis 51/05/5850  . Alcohol withdrawal ([place]) 08/10/2016  . Tobacco abuse counseling 08/10/2016    Past Surgical History:  Procedure Laterality Date  . APPENDECTOMY    . COLONOSCOPY    . COLONOSCOPY WITH PROPOFOL N/A 11/29/2014   Procedure: COLONOSCOPY WITH PROPOFOL;  Surgeon: Hulen Luster, MD;  Location: [facility] ENDOSCOPY;  Service: Gastroenterology;  Laterality: N/A;  . ESOPHAGOGASTRODUODENOSCOPY    . JOINT REPLACEMENT     right and left hip    Prior to Admission medications   Medication Sig Start Date End Date Taking? Authorizing Provider  allopurinol  (ZYLOPRIM) 300 MG tablet Take 300 mg by mouth daily.    [provider]  amiodarone (PACERONE) 400 MG tablet Take 1 tablet (400 mg total) by mouth daily. 10/12/16   Henreitta Leber, MD  apixaban (ELIQUIS) 5 MG TABS tablet Take 5 mg by mouth 2 (two) times daily.    [provider]  benzocaine (ORAJEL) 10 % mucosal gel Use as directed in the mouth or throat 4 (four) times daily as needed for mouth pain. 08/20/16   Fritzi Mandes, MD  digoxin (LANOXIN) 0.125 MG tablet Take 1 tablet (125 mcg total) by mouth daily. 10/12/16 10/12/17  Henreitta Leber, MD  diltiazem (CARDIZEM CD) 360 MG 24 hr capsule Take 1 capsule (360 mg total) by mouth daily. 08/20/16   Fritzi Mandes, MD  folic acid (FOLVITE) 1 MG tablet Take 1 tablet (1 mg total) by mouth daily. 08/20/16   Fritzi Mandes, MD  furosemide (LASIX) 40 MG tablet Take 40 mg by mouth daily.    [provider]  ibuprofen (ADVIL,MOTRIN) 100 MG/5ML suspension Take 200 mg by mouth every 4 (four) hours as needed.    [provider]  lisinopril (PRINIVIL,ZESTRIL) 5 MG tablet Take 5 mg by mouth daily.    [provider]  Multiple Vitamin (MULTIVITAMIN) tablet Take by mouth daily.    [provider]  potassium chloride SA (K-DUR,KLOR-CON) 20 MEQ tablet Take 20 mEq by mouth 2 (two) times daily.  [provider]  rOPINIRole (REQUIP) 0.5 MG tablet Take 0.5 mg by mouth 3 (three) times daily.    [provider]  temazepam (RESTORIL) 30 MG capsule Take 30 mg by mouth at bedtime as needed for sleep.    [provider]  thiamine 50 MG tablet Take 1 tablet (50 mg total) by mouth daily. 08/20/16   Fritzi Mandes, MD    Allergies Oxycodone; Cardura [doxazosin mesylate]; Darvon [propoxyphene]; Penicillins; Requip [ropinirole hcl]; and Septra [sulfamethoxazole-trimethoprim]  Family History  Problem Relation Age of Onset  . Hypertension Mother     Social History Social History   Tobacco Use  . Smoking  status: Current Every Day Smoker    Types: Cigars  . Smokeless tobacco: Never Used  Substance Use Topics  . Alcohol use: Yes  . Drug use: No    Review of Systems Constitutional: No fever/chills Eyes: No visual changes. ENT: No sore throat. Cardiovascular: Denies chest pain. Respiratory: Denies shortness of breath. Gastrointestinal: No abdominal pain.  No nausea, no vomiting.  No diarrhea.   Genitourinary: Negative for dysuria. Musculoskeletal: Positive for left hip pain. Skin: Negative for rash. Neurological: Negative for headaches, focal weakness or numbness.  ____________________________________________   PHYSICAL EXAM:  VITAL SIGNS: ED Triage Vitals [05/10/17 1530]  Enc Vitals Group     BP 163/115     Pulse 144     Resp 17     Temp 97.9     Temp src      SpO2 96     Weight 205 lb (93 kg)     Height 5\' 9"  (1.753 m)     Head Circumference      Peak Flow      Pain Score 9   Constitutional: Alert and oriented. Well appearing and in no distress. Eyes: Conjunctivae are normal.  ENT   Head: Normocephalic and atraumatic.   Nose: No congestion/rhinnorhea.   Mouth/Throat: Mucous membranes are moist.   Neck: No stridor. Hematological/Lymphatic/Immunilogical: No cervical lymphadenopathy. Cardiovascular: Tachycardic, regular rhythm.  No murmurs, rubs, or gallops. Respiratory: Normal respiratory effort without tachypnea nor retractions. Breath sounds are clear and equal bilaterally. No wheezes/rales/rhonchi. Gastrointestinal: Soft and non tender. No rebound. No guarding.  Genitourinary: Deferred Musculoskeletal: Normal range of motion in all extremities. Bilateral lower extremity edema.  Neurologic:  Normal speech and language. No gross focal neurologic deficits are appreciated.  Skin:  Skin is warm, dry and intact. No rash noted. Psychiatric: Mood and affect are normal. Speech and behavior are normal. Patient exhibits appropriate insight and  judgment.  ____________________________________________    LABS (pertinent positives/negatives)  Trop <0.03 CBC wbc 9.2, hgb 17.3, plt 229 BMP k 3.8, cr 0.83  ____________________________________________   EKG  I, Nance Pear, attending physician, personally viewed and interpreted this EKG  EKG Time: 1532 Rate: 152 Rhythm: atrial fibrillation with RVR Axis: normal Intervals: qtc 496 QRS: narrow, q waves v1, v2 ST changes: no st elevation Impression: abnormal ekg   ____________________________________________    RADIOLOGY  Left hip No acute fracture/dislocation  CXR  No acute lung disease. Multiple old compression fractures of spine and one new compression fracture from previous imaging.  I, Hermione Havlicek, personally viewed and evaluated these images (plain radiographs) as part of my medical decision making. ____________________________________________   PROCEDURES  Procedures  ____________________________________________   INITIAL IMPRESSION / ASSESSMENT AND PLAN / ED COURSE  Pertinent labs & imaging results that were available during my care of the patient were reviewed by me  and considered in my medical decision making (see chart for details).  Patient presented to the emergency department today with primary concern for left hip pain after a fall 3 days ago.  On exam he did have some tenderness to that hip and x-ray was obtained.  This did not show any fracture dislocation.  However EMS did note patient be tachycardic in A. fib with RVR.  He continued to be in A. fib with RVR here in the emergency department.  Patient was given multiple doses of IV diltiazem without significant relief.  Additionally was given narcotic pain medications to see if that could be accelerated the heart rate.  His heart rate did not improve.  Will plan on putting patient on diltiazem infusion admission to the hospital service.  I discussed the findings and plan with the  patient was comfortable with admission.  ____________________________________________   FINAL CLINICAL IMPRESSION(S) / ED DIAGNOSES  Final diagnoses:  Fall, initial encounter  Left hip pain  Atrial fibrillation with RVR ([place])     Note: This dictation was prepared with Dragon dictation. Any transcriptional errors that result from this process are unintentional     Nance Pear, MD 05/10/17 2256

## 2017-05-10 NOTE — ED Triage Notes (Signed)
Pt arrived via ems for c/o left hip p[ain after fall on Friday - pt has had bilat hip replacements in 1999 - as a side note pt was found to be in afib with hear rate of 170 - ems gave mtoprolol 2.5mg  and rate decreased to 140 - pt denies chest pain or shortness of breath

## 2017-05-10 NOTE — H&P (Signed)
[facility] [place] NAME: Brendan Edwards    MR#:  [id number]  DATE OF BIRTH:  05/03/1945  DATE OF ADMISSION:  05/10/2017  PRIMARY CARE PHYSICIAN: Tracie Harrier, MD   REQUESTING/REFERRING PHYSICIAN: Archie Balboa, MD  CHIEF COMPLAINT:   Chief Complaint  Patient presents with  . Fall  . Hip Pain  . Atrial Fibrillation    HISTORY OF PRESENT ILLNESS:  Brendan Edwards  is a 72 y.o. male who presents with fall at home and subsequent left hip pain.  He came to the ED for evaluation.  Left hip was without acute pathology on imaging, but he was found to be in A. fib with RVR.  He required multiple doses of diltiazem and then diltiazem drip to slow his rate down.  Hospitalist were called for admission  PAST MEDICAL HISTORY:   Past Medical History:  Diagnosis Date  . A-fib ([place])   . Benign prostatic hyperplasia   . Cardiomyopathy, secondary ([place])   . GERD (gastroesophageal reflux disease)   . Gout   . Hyperlipidemia   . Hypertension   . Obesity   . Sleep apnea     PAST SURGICAL HISTORY:   Past Surgical History:  Procedure Laterality Date  . APPENDECTOMY    . COLONOSCOPY    . COLONOSCOPY WITH PROPOFOL N/A 11/29/2014   Procedure: COLONOSCOPY WITH PROPOFOL;  Surgeon: Hulen Luster, MD;  Location: [facility] ENDOSCOPY;  Service: Gastroenterology;  Laterality: N/A;  . ESOPHAGOGASTRODUODENOSCOPY    . JOINT REPLACEMENT     right and left hip    SOCIAL HISTORY:   Social History   Tobacco Use  . Smoking status: Current Every Day Smoker    Types: Cigars  . Smokeless tobacco: Never Used  Substance Use Topics  . Alcohol use: Yes    FAMILY HISTORY:   Family History  Problem Relation Age of Onset  . Hypertension Mother     DRUG ALLERGIES:   Allergies  Allergen Reactions  . Oxycodone Other (See Comments)    Hallucinations  . Cardura [Doxazosin Mesylate]   . Darvon [Propoxyphene]   . Penicillins     Has patient had a PCN reaction  causing immediate rash, facial/tongue/throat swelling, SOB or lightheadedness with hypotension: Yes Has patient had a PCN reaction causing severe rash involving mucus membranes or skin necrosis: No Has patient had a PCN reaction that required hospitalization: No Has patient had a PCN reaction occurring within the last 10 years: No If all of the above answers are "NO", then may proceed with Cephalosporin use.  Marland Kitchen Requip [Ropinirole Hcl]   . Septra [Sulfamethoxazole-Trimethoprim]     MEDICATIONS AT HOME:   Prior to Admission medications   Medication Sig Start Date End Date Taking? Authorizing Provider  allopurinol (ZYLOPRIM) 300 MG tablet Take 300 mg by mouth daily.   Yes [provider]  amiodarone (PACERONE) 400 MG tablet Take 1 tablet (400 mg total) by mouth daily. Patient taking differently: Take 400 mg by mouth 2 (two) times daily.  10/12/16  Yes Sainani, Belia Heman, MD  apixaban (ELIQUIS) 5 MG TABS tablet Take 5 mg by mouth 2 (two) times daily.   Yes [provider]  lisinopril (PRINIVIL,ZESTRIL) 5 MG tablet Take 5 mg by mouth daily.   Yes [provider]  metoprolol succinate (TOPROL-XL) 50 MG 24 hr tablet Take 1 tablet by mouth daily. 04/15/17  Yes [provider]  potassium chloride SA (K-DUR,KLOR-CON) 20 MEQ tablet  Take 20 mEq by mouth 2 (two) times daily.   Yes [provider]  thiamine 50 MG tablet Take 1 tablet (50 mg total) by mouth daily. 08/20/16  Yes Fritzi Mandes, MD  traMADol (ULTRAM) 50 MG tablet Take 50 mg by mouth at bedtime.  04/28/17  Yes [provider]  benzocaine (ORAJEL) 10 % mucosal gel Use as directed in the mouth or throat 4 (four) times daily as needed for mouth pain. Patient not taking: Reported on 05/10/2017 08/20/16   Fritzi Mandes, MD  digoxin (LANOXIN) 0.125 MG tablet Take 1 tablet (125 mcg total) by mouth daily. Patient not taking: Reported on 05/10/2017 10/12/16 10/12/17  Henreitta Leber, MD  diltiazem (CARDIZEM CD) 360  MG 24 hr capsule Take 1 capsule (360 mg total) by mouth daily. Patient not taking: Reported on 05/10/2017 08/20/16   Fritzi Mandes, MD  folic acid (FOLVITE) 1 MG tablet Take 1 tablet (1 mg total) by mouth daily. Patient not taking: Reported on 05/10/2017 08/20/16   Fritzi Mandes, MD  furosemide (LASIX) 40 MG tablet Take 40 mg by mouth daily.    [provider]    REVIEW OF SYSTEMS:  Review of Systems  Constitutional: Negative for chills, fever, malaise/fatigue and weight loss.  HENT: Negative for ear pain, hearing loss and tinnitus.   Eyes: Negative for blurred vision, double vision, pain and redness.  Respiratory: Negative for cough, hemoptysis and shortness of breath.   Cardiovascular: Negative for chest pain, palpitations, orthopnea and leg swelling.  Gastrointestinal: Negative for abdominal pain, constipation, diarrhea, nausea and vomiting.  Genitourinary: Negative for dysuria, frequency and hematuria.  Musculoskeletal: Positive for joint pain (Left hip pain). Negative for back pain and neck pain.  Skin:       No acne, rash, or lesions  Neurological: Negative for dizziness, tremors, focal weakness and weakness.  Endo/Heme/Allergies: Negative for polydipsia. Does not bruise/bleed easily.  Psychiatric/Behavioral: Negative for depression. The patient is not nervous/anxious and does not have insomnia.      VITAL SIGNS:   Vitals:   05/10/17 2215 05/10/17 2230 05/10/17 2245 05/10/17 2300  BP:    (!) 124/97  Pulse:      Resp: 15 (!) 21 19 12   Temp:      TempSrc:      SpO2:      Weight:      Height:       Wt Readings from Last 3 Encounters:  05/10/17 93 kg (205 lb)  10/08/16 94.2 kg (207 lb 11.2 oz)  08/20/16 92 kg (202 lb 11.8 oz)    PHYSICAL EXAMINATION:  Physical Exam  Vitals reviewed. Constitutional: He is oriented to person, place, and time. He appears well-developed and well-nourished. No distress.  HENT:  Head: Normocephalic and atraumatic.  Mouth/Throat:  Oropharynx is clear and moist.  Eyes: Conjunctivae and EOM are normal. Pupils are equal, round, and reactive to light. No scleral icterus.  Neck: Normal range of motion. Neck supple. No JVD present. No thyromegaly present.  Cardiovascular: Intact distal pulses. Exam reveals no gallop and no friction rub.  No murmur heard. Tachycardic, irregular rhythm  Respiratory: Effort normal and breath sounds normal. No respiratory distress. He has no wheezes. He has no rales.  GI: Soft. Bowel sounds are normal. He exhibits no distension. There is no tenderness.  Musculoskeletal: Normal range of motion. He exhibits tenderness (Left hip). He exhibits no edema.  No arthritis, no gout  Lymphadenopathy:    He has no cervical adenopathy.  Neurological: He is alert and oriented to person, place, and time. No cranial nerve deficit.  No dysarthria, no aphasia  Skin: Skin is warm and dry. No rash noted. No erythema.  Psychiatric: He has a normal mood and affect. His behavior is normal. Judgment and thought content normal.    LABORATORY PANEL:   CBC Recent Labs  Lab 05/10/17 1532  WBC 9.2  HGB 17.3  HCT 51.4  PLT 229   ------------------------------------------------------------------------------------------------------------------  Chemistries  Recent Labs  Lab 05/10/17 1532  NA 137  K 3.8  CL 99*  CO2 27  GLUCOSE 142*  BUN 14  CREATININE 0.83  CALCIUM 8.7*   ------------------------------------------------------------------------------------------------------------------  Cardiac Enzymes Recent Labs  Lab 05/10/17 1532  TROPONINI <0.03   ------------------------------------------------------------------------------------------------------------------  RADIOLOGY:  Dg Chest 2 View  Result Date: 05/10/2017 CLINICAL DATA:  Tachycardia. History of atrial fibrillation and hypertension. EXAM: CHEST  2 VIEW COMPARISON:  Radiographs 10/07/2016 and 08/11/2016. CT 08/10/2016. Thoracic MRI  10/09/2016. FINDINGS: The heart size and mediastinal contours are stable. There is mild aortic atherosclerosis. There is mild chronic scarring at both lung bases. No evidence of airspace disease, edema, significant pleural effusion or pneumothorax. Chronic compression deformities at T3 and T12, stable. Superior endplate compression fracture at L2 is new from MRI of 7 months ago. IMPRESSION: No acute cardiopulmonary process. Interval L2 compression fracture since June 2018. Electronically Signed   By: Richardean Sale M.D.   On: 05/10/2017 16:11   Dg Hip Unilat W Or Wo Pelvis 2-3 Views Left  Result Date: 05/10/2017 CLINICAL DATA:  Left hip pain.  Fall on Friday EXAM: DG HIP (WITH OR WITHOUT PELVIS) 2-3V LEFT COMPARISON:  01/22/2013 FINDINGS: Bilateral hip replacements noted. No acute bony abnormality. Specifically, no fracture, subluxation, or dislocation. IMPRESSION: No acute bony abnormality.  Bilateral hip replacements. Electronically Signed   By: Rolm Baptise M.D.   On: 05/10/2017 16:41    EKG:   Orders placed or performed during the hospital encounter of 05/10/17  . ED EKG within 10 minutes  . ED EKG within 10 minutes    IMPRESSION AND PLAN:  Principal Problem:   Atrial fibrillation with RVR (HCC) -patient required multiple doses of IV diltiazem and rate.  Will admit him with diltiazem drip and cardiology consult Active Problems:   Diabetes ([place]) -sliding scale insulin with corresponding glucose checks   HTN (hypertension) -continue home meds   HLD (hyperlipidemia) -home dose anti-lipids  All the records are reviewed and case discussed with ED provider. Management plans discussed with the patient and/or family.  DVT PROPHYLAXIS: Systemic anticoagulation  GI PROPHYLAXIS: None  ADMISSION STATUS: Observation  CODE STATUS: Full Code Status History    Date Active Date Inactive Code Status Order ID Comments User Context   10/07/2016 16:40 10/12/2016 19:09 Full Code [id number]  Vaughan Basta, MD ED   08/10/2016 20:17 08/20/2016 15:17 Full Code [id number]  Theodoro Grist, MD ED      TOTAL TIME TAKING CARE OF THIS PATIENT: 40 minutes.   Adaysha Dubinsky Gibson 05/10/2017, 11:10 PM  [employer]  [phone]  CC: Primary care physician; Tracie Harrier, MD  Note:  This document was prepared using Dragon voice recognition software and may include unintentional dictation errors.

## 2017-05-11 ENCOUNTER — Other Ambulatory Visit: Payer: Self-pay

## 2017-05-11 ENCOUNTER — Observation Stay: Payer: PPO

## 2017-05-11 DIAGNOSIS — M9702XA Periprosthetic fracture around internal prosthetic left hip joint, initial encounter: Secondary | ICD-10-CM | POA: Diagnosis not present

## 2017-05-11 DIAGNOSIS — I4891 Unspecified atrial fibrillation: Secondary | ICD-10-CM | POA: Diagnosis not present

## 2017-05-11 DIAGNOSIS — E119 Type 2 diabetes mellitus without complications: Secondary | ICD-10-CM | POA: Diagnosis not present

## 2017-05-11 DIAGNOSIS — M25552 Pain in left hip: Secondary | ICD-10-CM | POA: Diagnosis not present

## 2017-05-11 DIAGNOSIS — R55 Syncope and collapse: Secondary | ICD-10-CM | POA: Diagnosis not present

## 2017-05-11 DIAGNOSIS — I10 Essential (primary) hypertension: Secondary | ICD-10-CM | POA: Diagnosis not present

## 2017-05-11 DIAGNOSIS — S32402A Unspecified fracture of left acetabulum, initial encounter for closed fracture: Secondary | ICD-10-CM | POA: Diagnosis not present

## 2017-05-11 DIAGNOSIS — I480 Paroxysmal atrial fibrillation: Secondary | ICD-10-CM | POA: Diagnosis not present

## 2017-05-11 LAB — GLUCOSE, CAPILLARY
GLUCOSE-CAPILLARY: 191 mg/dL — AB (ref 65–99)
GLUCOSE-CAPILLARY: 133 mg/dL — AB (ref 65–99)
GLUCOSE-CAPILLARY: 161 mg/dL — AB (ref 65–99)
Glucose-Capillary: 131 mg/dL — ABNORMAL HIGH (ref 65–99)
Glucose-Capillary: 110 mg/dL — ABNORMAL HIGH (ref 65–99)

## 2017-05-11 LAB — BASIC METABOLIC PANEL
ANION GAP: 10 (ref 5–15)
BUN: 13 mg/dL (ref 6–20)
CALCIUM: 8.4 mg/dL — AB (ref 8.9–10.3)
CO2: 27 mmol/L (ref 22–32)
Chloride: 100 mmol/L — ABNORMAL LOW (ref 101–111)
Creatinine, Ser: 0.81 mg/dL (ref 0.61–1.24)
GFR calc non Af Amer: >60 mL/min (ref >60)
GLUCOSE: 99 mg/dL (ref 65–99)
POTASSIUM: 3.2 mmol/L — AB (ref 3.5–5.1)
Sodium: 137 mmol/L (ref 135–145)

## 2017-05-11 LAB — CBC
HEMATOCRIT: 48.7 % (ref 40.0–52.0)
HEMOGLOBIN: 16.3 g/dL (ref 13.0–18.0)
MCH: 35.2 pg — AB (ref 26.0–34.0)
MCHC: 33.5 g/dL (ref 32.0–36.0)
MCV: 105.3 fL — ABNORMAL HIGH (ref 80.0–100.0)
Platelets: 187 10*3/uL (ref 150–440)
RBC: 4.63 MIL/uL (ref 4.40–5.90)
RDW: 15 % — ABNORMAL HIGH (ref 11.5–14.5)
WBC: 8.1 10*3/uL (ref 3.8–10.6)

## 2017-05-11 LAB — MAGNESIUM: Magnesium: 1.5 mg/dL — ABNORMAL LOW (ref 1.7–2.4)

## 2017-05-11 MED ORDER — TRAMADOL HCL 50 MG PO TABS
50.0000 mg | ORAL_TABLET | Freq: Four times a day (QID) | ORAL | Status: DC | PRN
  Administered 2017-05-11 (×2): 50 mg via ORAL
  Filled 2017-05-11 (×2): qty 1

## 2017-05-11 MED ORDER — METOPROLOL SUCCINATE ER 50 MG PO TB24
50.0000 mg | ORAL_TABLET | Freq: Every day | ORAL | Status: DC
  Administered 2017-05-11 – 2017-05-12 (×2): 50 mg via ORAL
  Filled 2017-05-11 (×3): qty 1

## 2017-05-11 MED ORDER — APIXABAN 5 MG PO TABS
5.0000 mg | ORAL_TABLET | Freq: Two times a day (BID) | ORAL | Status: DC
  Administered 2017-05-11: 5 mg via ORAL
  Filled 2017-05-11: qty 1

## 2017-05-11 MED ORDER — LISINOPRIL 5 MG PO TABS
5.0000 mg | ORAL_TABLET | Freq: Every day | ORAL | Status: DC
  Administered 2017-05-11 – 2017-05-13 (×3): 5 mg via ORAL
  Filled 2017-05-11 (×3): qty 1

## 2017-05-11 MED ORDER — KETOROLAC TROMETHAMINE 30 MG/ML IJ SOLN
30.0000 mg | Freq: Three times a day (TID) | INTRAMUSCULAR | Status: DC
  Administered 2017-05-11 – 2017-05-13 (×6): 30 mg via INTRAVENOUS
  Filled 2017-05-11 (×6): qty 1

## 2017-05-11 MED ORDER — POTASSIUM CHLORIDE CRYS ER 20 MEQ PO TBCR
40.0000 meq | EXTENDED_RELEASE_TABLET | Freq: Once | ORAL | Status: AC
  Administered 2017-05-11: 40 meq via ORAL
  Filled 2017-05-11: qty 2

## 2017-05-11 MED ORDER — OXYCODONE-ACETAMINOPHEN 5-325 MG PO TABS
1.0000 | ORAL_TABLET | Freq: Four times a day (QID) | ORAL | Status: DC | PRN
  Administered 2017-05-11 – 2017-05-13 (×2): 1 via ORAL
  Filled 2017-05-11 (×2): qty 1

## 2017-05-11 MED ORDER — ONDANSETRON HCL 4 MG/2ML IJ SOLN: 4.0000 mg | Freq: Four times a day (QID) | INTRAMUSCULAR | Status: DC | PRN

## 2017-05-11 MED ORDER — FUROSEMIDE 40 MG PO TABS
40.0000 mg | ORAL_TABLET | Freq: Every day | ORAL | Status: DC
  Administered 2017-05-11 – 2017-05-13 (×3): 40 mg via ORAL
  Filled 2017-05-11 (×3): qty 1

## 2017-05-11 MED ORDER — METOPROLOL TARTRATE 25 MG PO TABS
25.0000 mg | ORAL_TABLET | Freq: Two times a day (BID) | ORAL | Status: DC
  Administered 2017-05-11 – 2017-05-12 (×4): 25 mg via ORAL
  Filled 2017-05-11 (×5): qty 1

## 2017-05-11 MED ORDER — DILTIAZEM HCL 30 MG PO TABS
60.0000 mg | ORAL_TABLET | Freq: Three times a day (TID) | ORAL | Status: DC
  Administered 2017-05-11 – 2017-05-13 (×6): 60 mg via ORAL
  Filled 2017-05-11 (×6): qty 2

## 2017-05-11 MED ORDER — ONDANSETRON HCL 4 MG PO TABS: 4.0000 mg | ORAL_TABLET | Freq: Four times a day (QID) | ORAL | Status: DC | PRN

## 2017-05-11 MED ORDER — AMIODARONE HCL 200 MG PO TABS
400.0000 mg | ORAL_TABLET | Freq: Two times a day (BID) | ORAL | Status: DC
  Administered 2017-05-11 – 2017-05-13 (×5): 400 mg via ORAL
  Filled 2017-05-11 (×5): qty 2

## 2017-05-11 MED ORDER — ACETAMINOPHEN 325 MG PO TABS: 650.0000 mg | ORAL_TABLET | Freq: Four times a day (QID) | ORAL | Status: DC | PRN

## 2017-05-11 MED ORDER — INSULIN ASPART 100 UNIT/ML ~~LOC~~ SOLN
0.0000 [IU] | Freq: Three times a day (TID) | SUBCUTANEOUS | Status: DC
  Administered 2017-05-11 (×2): 1 [IU] via SUBCUTANEOUS
  Administered 2017-05-12: 2 [IU] via SUBCUTANEOUS
  Administered 2017-05-12: 1 [IU] via SUBCUTANEOUS
  Filled 2017-05-11 (×4): qty 1

## 2017-05-11 MED ORDER — ACETAMINOPHEN 650 MG RE SUPP: 650.0000 mg | Freq: Four times a day (QID) | RECTAL | Status: DC | PRN

## 2017-05-11 MED ORDER — INSULIN ASPART 100 UNIT/ML ~~LOC~~ SOLN: 0.0000 [IU] | Freq: Every day | SUBCUTANEOUS | Status: DC

## 2017-05-11 MED ORDER — ALUM & MAG HYDROXIDE-SIMETH 200-200-20 MG/5ML PO SUSP
30.0000 mL | ORAL | Status: DC | PRN
  Administered 2017-05-11: 30 mL via ORAL
  Filled 2017-05-11: qty 30

## 2017-05-11 MED ORDER — SODIUM CHLORIDE 0.9% FLUSH
3.0000 mL | Freq: Two times a day (BID) | INTRAVENOUS | Status: DC
  Administered 2017-05-11 – 2017-05-13 (×5): 3 mL via INTRAVENOUS

## 2017-05-11 MED ORDER — DILTIAZEM HCL 100 MG IV SOLR
5.0000 mg/h | INTRAVENOUS | Status: DC
  Administered 2017-05-11 (×3): 15 mg/h via INTRAVENOUS
  Filled 2017-05-11 (×2): qty 100

## 2017-05-11 MED ORDER — TRAZODONE HCL 50 MG PO TABS
50.0000 mg | ORAL_TABLET | Freq: Every day | ORAL | Status: DC
Start: 2017-05-11 — End: 2017-05-13
  Administered 2017-05-11 – 2017-05-12 (×2): 50 mg via ORAL
  Filled 2017-05-11 (×2): qty 1

## 2017-05-11 MED ORDER — DILTIAZEM HCL ER COATED BEADS 180 MG PO CP24
360.0000 mg | ORAL_CAPSULE | Freq: Every day | ORAL | Status: DC
  Administered 2017-05-11: 360 mg via ORAL
  Filled 2017-05-11: qty 2

## 2017-05-11 NOTE — Consult Note (Signed)
ORTHOPAEDIC CONSULTATION  PATIENT NAME: Brendan Edwards DOB: 05-31-1945  MRN: [id number]  REQUESTING PHYSICIAN: Dustin Flock, MD  Chief Complaint: Left hip and groin pain  HPI: Brendan Edwards is a 72 y.o. male who complains of left hip and groin pain with weightbearing activities.  The patient states that he sustained a fall approximately 2 weeks ago but was able to ambulate with a cane.  However, he has had some progressive increase in the left hip and groin pain and subsequently began using his walker for ambulation.  He denies any low back or buttocks pain.  He denies any radiation of pain down the leg.  The denies any significant pain with gentle range of motion of the left hip, but weightbearing does increase the pain.  He was actually admitted when noted to be in atrial fibrillation with rapid ventricular rate.  Of note, the patient is approximately 20 years status post left total hip arthroplasty performed by Dr. Cloria Spring.  Past Medical History:  Diagnosis Date  . A-fib ([place])   . Benign prostatic hyperplasia   . Cardiomyopathy, secondary ([place])   . GERD (gastroesophageal reflux disease)   . Gout   . Hyperlipidemia   . Hypertension   . Obesity   . Sleep apnea    Past Surgical History:  Procedure Laterality Date  . APPENDECTOMY    . COLONOSCOPY    . COLONOSCOPY WITH PROPOFOL N/A 11/29/2014   Procedure: COLONOSCOPY WITH PROPOFOL;  Surgeon: Hulen Luster, MD;  Location: [facility] ENDOSCOPY;  Service: Gastroenterology;  Laterality: N/A;  . ESOPHAGOGASTRODUODENOSCOPY    . JOINT REPLACEMENT     right and left hip   Social History   Socioeconomic History  . Marital status: Widowed    Spouse name: None  . Number of children: None  . Years of education: None  . Highest education level: None  Social Needs  . Financial resource strain: None  . Food insecurity - worry: None  . Food insecurity - inability: None  . Transportation needs - medical: None  . Transportation needs -  non-medical: None  Occupational History  . None  Tobacco Use  . Smoking status: Current Every Day Smoker    Types: Cigars  . Smokeless tobacco: Never Used  Substance and Sexual Activity  . Alcohol use: Yes  . Drug use: No  . Sexual activity: None  Other Topics Concern  . None  Social History Narrative  . None   Family History  Problem Relation Age of Onset  . Hypertension Mother    Allergies  Allergen Reactions  . Oxycodone Other (See Comments)    Hallucinations  . Cardura [Doxazosin Mesylate]   . Darvon [Propoxyphene]   . Penicillins     Has patient had a PCN reaction causing immediate rash, facial/tongue/throat swelling, SOB or lightheadedness with hypotension: Yes Has patient had a PCN reaction causing severe rash involving mucus membranes or skin necrosis: No Has patient had a PCN reaction that required hospitalization: No Has patient had a PCN reaction occurring within the last 10 years: No If all of the above answers are "NO", then may proceed with Cephalosporin use.  Marland Kitchen Requip [Ropinirole Hcl]   . Septra [Sulfamethoxazole-Trimethoprim]    Prior to Admission medications   Medication Sig Start Date End Date Taking? Authorizing Provider  allopurinol (ZYLOPRIM) 300 MG tablet Take 300 mg by mouth daily.   Yes [provider]  amiodarone (PACERONE) 400 MG tablet Take 1 tablet (400 mg total) by  mouth daily. Patient taking differently: Take 400 mg by mouth 2 (two) times daily.  10/12/16  Yes Sainani, Belia Heman, MD  apixaban (ELIQUIS) 5 MG TABS tablet Take 5 mg by mouth 2 (two) times daily.   Yes [provider]  lisinopril (PRINIVIL,ZESTRIL) 5 MG tablet Take 5 mg by mouth daily.   Yes [provider]  metoprolol succinate (TOPROL-XL) 50 MG 24 hr tablet Take 1 tablet by mouth daily. 04/15/17  Yes [provider]  potassium chloride SA (K-DUR,KLOR-CON) 20 MEQ tablet Take 20 mEq by mouth 2 (two) times daily.   Yes [provider]  thiamine  50 MG tablet Take 1 tablet (50 mg total) by mouth daily. 08/20/16  Yes Fritzi Mandes, MD  traMADol (ULTRAM) 50 MG tablet Take 50 mg by mouth at bedtime.  04/28/17  Yes [provider]  benzocaine (ORAJEL) 10 % mucosal gel Use as directed in the mouth or throat 4 (four) times daily as needed for mouth pain. Patient not taking: Reported on 05/10/2017 08/20/16   Fritzi Mandes, MD  digoxin (LANOXIN) 0.125 MG tablet Take 1 tablet (125 mcg total) by mouth daily. Patient not taking: Reported on 05/10/2017 10/12/16 10/12/17  Henreitta Leber, MD  diltiazem (CARDIZEM CD) 360 MG 24 hr capsule Take 1 capsule (360 mg total) by mouth daily. Patient not taking: Reported on 05/10/2017 08/20/16   Fritzi Mandes, MD  folic acid (FOLVITE) 1 MG tablet Take 1 tablet (1 mg total) by mouth daily. Patient not taking: Reported on 05/10/2017 08/20/16   Fritzi Mandes, MD  furosemide (LASIX) 40 MG tablet Take 40 mg by mouth daily.    [provider]   Dg Chest 2 View  Result Date: 05/10/2017 CLINICAL DATA:  Tachycardia. History of atrial fibrillation and hypertension. EXAM: CHEST  2 VIEW COMPARISON:  Radiographs 10/07/2016 and 08/11/2016. CT 08/10/2016. Thoracic MRI 10/09/2016. FINDINGS: The heart size and mediastinal contours are stable. There is mild aortic atherosclerosis. There is mild chronic scarring at both lung bases. No evidence of airspace disease, edema, significant pleural effusion or pneumothorax. Chronic compression deformities at T3 and T12, stable. Superior endplate compression fracture at L2 is new from MRI of 7 months ago. IMPRESSION: No acute cardiopulmonary process. Interval L2 compression fracture since June 2018. Electronically Signed   By: Richardean Sale M.D.   On: 05/10/2017 16:11   Ct Hip Left Wo Contrast  Result Date: 05/11/2017 CLINICAL DATA:  The patient was having issues with unsteadiness and fell and hurt his left hip but no evidence of fracture. EXAM: CT OF THE LEFT HIP WITHOUT CONTRAST TECHNIQUE:  Multidetector CT imaging of the left hip was performed according to the standard protocol. Multiplanar CT image reconstructions were also generated. COMPARISON:  None. FINDINGS: Bones/Joint/Cartilage Left total hip arthroplasty. Nondisplaced fracture of the anterior acetabulum adjacent to the acetabular cup component extending into the superior pubic ramus-acetabular junction. No other fracture or dislocation. Ligaments Ligaments are suboptimally evaluated by CT. Muscles and Tendons Muscles are normal. No muscle atrophy. Small amount of hemorrhagic fluid in the perivesicular space. Soft tissue No fluid collection or hematoma. No soft tissue mass. Peripheral vascular atherosclerotic disease. IMPRESSION: 1. Left total hip arthroplasty. Nondisplaced fracture of the anterior acetabulum adjacent to the acetabular cup component extending into the superior pubic ramus-acetabular junction. Electronically Signed   By: Kathreen Devoid   On: 05/11/2017 14:06   Dg Hip Unilat W Or Wo Pelvis 2-3 Views Left  Result Date: 05/10/2017 CLINICAL DATA:  Left  hip pain.  Fall on Friday EXAM: DG HIP (WITH OR WITHOUT PELVIS) 2-3V LEFT COMPARISON:  01/22/2013 FINDINGS: Bilateral hip replacements noted. No acute bony abnormality. Specifically, no fracture, subluxation, or dislocation. IMPRESSION: No acute bony abnormality.  Bilateral hip replacements. Electronically Signed   By: Rolm Baptise M.D.   On: 05/10/2017 16:41    Positive ROS: All other systems have been reviewed and were otherwise negative with the exception of those mentioned in the HPI and as above.  Physical Exam: General: Well developed, well nourished male seen in no acute distress. Skin: No lesions in the area of chief complaint Neurologic: Awake, alert, and oriented. Sensory function is grossly intact. Motor strength is felt to be 5 over 5 bilaterally. No clonus or tremor. Good motor coordination. Lymphatic: No axillary or cervical  lymphadenopathy  MUSCULOSKELETAL: Examination of the left hip demonstrates good active and passive range of motion.  Logroll maneuver is nontender.  Good hip flexor and abductor strength is appreciated.  No gross tenderness to palpation along the greater trochanter.  Some discomfort is elicited with axial compression of the hip.  Assessment: Left periprosthetic acetabular fracture, nondisplaced  Plan: The findings were discussed in detail with the patient.  I do not appreciate any gross evidence of loosening of the acetabular component.  The fracture is nondisplaced at the present time but does extend from the anterior aspect of the acetabulum into the superior pubic ramus. I have recommended foot flat touchdown weightbearing with use of a walker.  Consult was placed for physical therapy to work on transfers and gait training.  Would also recommend the patient is safe for discharge to home, arrangements be made for home health physical therapy.  Railyn House P. Holley Bouche M.D.

## 2017-05-11 NOTE — Progress Notes (Signed)
Patient requested sleep aid for bedtime. Notified Dr.Willis for order for trazadone.

## 2017-05-11 NOTE — Care Management Obs Status (Signed)
[place] NOTIFICATION   Patient Details  Name: Brendan Edwards MRN: [id number] Date of Birth: 25-Feb-1946   Medicare Observation Status Notification Given:  Yes    Katrina Stack, RN 05/11/2017, 10:03 AM

## 2017-05-11 NOTE — Progress Notes (Signed)
Brendan Edwards at [facility]                                                                                                                                                                                  Patient Demographics   Brendan Edwards, is a 72 y.o. male, DOB - 02-04-1946, XNA:[id number]  Admit date - 05/10/2017   Admitting Physician Lance Coon, MD  Outpatient Primary MD for the patient is Tracie Harrier, MD   LOS - 0  Subjective:  Patient complains of pain in the hip heart rate continues to be elevated   Review of Systems:   CONSTITUTIONAL: No documented fever. No fatigue, weakness. No weight gain, no weight loss.  EYES: No blurry or double vision.  ENT: No tinnitus. No postnasal drip. No redness of the oropharynx.  RESPIRATORY: No cough, no wheeze, no hemoptysis. No dyspnea.  CARDIOVASCULAR: No chest pain. No orthopnea. No palpitations. No syncope.  GASTROINTESTINAL: No nausea, no vomiting or diarrhea. No abdominal pain. No melena or hematochezia.  GENITOURINARY: No dysuria or hematuria.  ENDOCRINE: No polyuria or nocturia. No heat or cold intolerance.  HEMATOLOGY: No anemia. No bruising. No bleeding.  INTEGUMENTARY: No rashes. No lesions.  MUSCULOSKELETAL: No arthritis. No swelling. No gout.  Positive hip pain NEUROLOGIC: No numbness, tingling, or ataxia. No seizure-type activity.  PSYCHIATRIC: No anxiety. No insomnia. No ADD.    Vitals:   Vitals:   05/11/17 0900 05/11/17 0904 05/11/17 1400 05/11/17 1457  BP: 138/90 138/90 112/75 120/75  Pulse: 91     Resp:    16  Temp:    97.6 F (36.4 C)  TempSrc:    Oral  SpO2:    94%  Weight:      Height:        Wt Readings from Last 3 Encounters:  05/11/17 205 lb 12.8 oz (93.4 kg)  10/08/16 207 lb 11.2 oz (94.2 kg)  08/20/16 202 lb 11.8 oz (92 kg)     Intake/Output Summary (Last 24 hours) at 05/11/2017 1720 Last data filed at 05/11/2017 1600 Gross per 24 hour  Intake 1639.71 ml  Output  414 ml  Net 1225.71 ml    Physical Exam:   GENERAL: Pleasant-appearing in no apparent distress.  HEAD, EYES, EARS, NOSE AND THROAT: Atraumatic, normocephalic. Extraocular muscles are intact. Pupils equal and reactive to light. Sclerae anicteric. No conjunctival injection. No oro-pharyngeal erythema.  NECK: Supple. There is no jugular venous distention. No bruits, no lymphadenopathy, no thyromegaly.  HEART: Irregularly irregular. No murmurs, no rubs, no clicks.  LUNGS: Clear to auscultation bilaterally. No rales or rhonchi. No wheezes.  ABDOMEN: Soft, flat, nontender, nondistended. Has good  bowel sounds. No hepatosplenomegaly appreciated.  EXTREMITIES: No evidence of any cyanosis, clubbing, or peripheral edema.  +2 pedal and radial pulses bilaterally.  Left-sided hip pain NEUROLOGIC: The patient is alert, awake, and oriented x3 with no focal motor or sensory deficits appreciated bilaterally.  SKIN: Moist and warm with no rashes appreciated.  Psych: Not anxious, depressed LN: No inguinal LN enlargement    Antibiotics   Anti-infectives (From admission, onward)   None      Medications   Scheduled Meds: . amiodarone  400 mg Oral BID  . diltiazem  60 mg Oral Q8H  . furosemide  40 mg Oral Daily  . insulin aspart  0-5 Units Subcutaneous QHS  . insulin aspart  0-9 Units Subcutaneous TID WC  . ketorolac  30 mg Intravenous Q8H  . lisinopril  5 mg Oral Daily  . metoprolol succinate  50 mg Oral Daily  . metoprolol tartrate  25 mg Oral BID   Continuous Infusions: PRN Meds:.acetaminophen **OR** acetaminophen, ondansetron **OR** ondansetron (ZOFRAN) IV, oxyCODONE-acetaminophen   Data Review:   Micro Results No results found for this or any previous visit (from the past 240 hour(s)).  Radiology Reports Dg Chest 2 View  Result Date: 05/10/2017 CLINICAL DATA:  Tachycardia. History of atrial fibrillation and hypertension. EXAM: CHEST  2 VIEW COMPARISON:  Radiographs 10/07/2016 and  08/11/2016. CT 08/10/2016. Thoracic MRI 10/09/2016. FINDINGS: The heart size and mediastinal contours are stable. There is mild aortic atherosclerosis. There is mild chronic scarring at both lung bases. No evidence of airspace disease, edema, significant pleural effusion or pneumothorax. Chronic compression deformities at T3 and T12, stable. Superior endplate compression fracture at L2 is new from MRI of 7 months ago. IMPRESSION: No acute cardiopulmonary process. Interval L2 compression fracture since June 2018. Electronically Signed   By: Richardean Sale M.D.   On: 05/10/2017 16:11   Ct Hip Left Wo Contrast  Result Date: 05/11/2017 CLINICAL DATA:  The patient was having issues with unsteadiness and fell and hurt his left hip but no evidence of fracture. EXAM: CT OF THE LEFT HIP WITHOUT CONTRAST TECHNIQUE: Multidetector CT imaging of the left hip was performed according to the standard protocol. Multiplanar CT image reconstructions were also generated. COMPARISON:  None. FINDINGS: Bones/Joint/Cartilage Left total hip arthroplasty. Nondisplaced fracture of the anterior acetabulum adjacent to the acetabular cup component extending into the superior pubic ramus-acetabular junction. No other fracture or dislocation. Ligaments Ligaments are suboptimally evaluated by CT. Muscles and Tendons Muscles are normal. No muscle atrophy. Small amount of hemorrhagic fluid in the perivesicular space. Soft tissue No fluid collection or hematoma. No soft tissue mass. Peripheral vascular atherosclerotic disease. IMPRESSION: 1. Left total hip arthroplasty. Nondisplaced fracture of the anterior acetabulum adjacent to the acetabular cup component extending into the superior pubic ramus-acetabular junction. Electronically Signed   By: Kathreen Devoid   On: 05/11/2017 14:06   Dg Hip Unilat W Or Wo Pelvis 2-3 Views Left  Result Date: 05/10/2017 CLINICAL DATA:  Left hip pain.  Fall on Friday EXAM: DG HIP (WITH OR WITHOUT PELVIS) 2-3V  LEFT COMPARISON:  01/22/2013 FINDINGS: Bilateral hip replacements noted. No acute bony abnormality. Specifically, no fracture, subluxation, or dislocation. IMPRESSION: No acute bony abnormality.  Bilateral hip replacements. Electronically Signed   By: Rolm Baptise M.D.   On: 05/10/2017 16:41     CBC Recent Labs  Lab 05/10/17 1532 05/11/17 0532  WBC 9.2 8.1  HGB 17.3 16.3  HCT 51.4 48.7  PLT 229 187  MCV 104.5* 105.3*  MCH 35.1* 35.2*  MCHC 33.6 33.5  RDW 14.9* 15.0*    Chemistries  Recent Labs  Lab 05/10/17 1532 05/11/17 0532  NA 137 137  K 3.8 3.2*  CL 99* 100*  CO2 27 27  GLUCOSE 142* 99  BUN 14 13  CREATININE 0.83 0.81  CALCIUM 8.7* 8.4*  MG  --  1.5*   ------------------------------------------------------------------------------------------------------------------ estimated creatinine clearance is 91.4 mL/min (by C-G formula based on SCr of 0.81 mg/dL). ------------------------------------------------------------------------------------------------------------------ No results for input(s): HGBA1C in the last 72 hours. ------------------------------------------------------------------------------------------------------------------ No results for input(s): CHOL, HDL, LDLCALC, TRIG, CHOLHDL, LDLDIRECT in the last 72 hours. ------------------------------------------------------------------------------------------------------------------ No results for input(s): TSH, T4TOTAL, T3FREE, THYROIDAB in the last 72 hours.  Invalid input(s): FREET3 ------------------------------------------------------------------------------------------------------------------ No results for input(s): VITAMINB12, FOLATE, FERRITIN, TIBC, IRON, RETICCTPCT in the last 72 hours.  Coagulation profile No results for input(s): INR, PROTIME in the last 168 hours.  No results for input(s): DDIMER in the last 72 hours.  Cardiac Enzymes Recent Labs  Lab 05/10/17 1532  TROPONINI <0.03    ------------------------------------------------------------------------------------------------------------------ Invalid input(s): POCBNP    Assessment & Plan  Patient is a 72 year old who presented to the emergency room with complaint of hip pain Principal Problem:  1.  Atrial fibrillation with RVR ([place]) - I have discontinued his IV Cardizem drip placed him on Cardizem p.o. as well as metoprolol heart rate much better 2.  Left hip pain CT scan shows a nondisplaced fracture Bedrest Discontinue his anticoagulation Orthopedic consult Pain control 3.   Diabetes ([place]) -sliding scale insulin with corresponding glucose checks 4.   HTN (hypertension) -continue therapy with lisinopril and metoprolol 5.     Hypokalemia replace potassium        Code Status Orders  (From admission, onward)        Start     Ordered   05/11/17 0120  Full code  Continuous     05/11/17 0119    Code Status History    Date Active Date Inactive Code Status Order ID Comments User Context   10/07/2016 16:40 10/12/2016 19:09 Full Code [id number]  Vaughan Basta, MD ED   08/10/2016 20:17 08/20/2016 15:17 Full Code [id number]  Theodoro Grist, MD ED           Consults cardiology orthopedics  DVT Prophylaxis SCDs  Lab Results  Component Value Date   PLT 187 05/11/2017     Time Spent in minutes   35 minutes greater than 50% of time spent in care coordination and counseling patient regarding the condition and plan of care.   Dustin Flock M.D on 05/11/2017 at 5:20 PM  Between 7am to 6pm - Pager - [phone]  After 6pm go to [email or web address] - password [email or web address]   Office  [phone]

## 2017-05-11 NOTE — Consult Note (Signed)
[facility] Consultation Note  Patient ID: Brendan Edwards, MRN: [id number], DOB/AGE: 73-12-1945 72 y.o. Admit date: 05/10/2017   Date of Consult: 05/11/2017 Primary Physician: Tracie Harrier, MD Primary Cardiologist: Raynelle Chary  Chief Complaint:  Chief Complaint  Patient presents with  . Fall  . Hip Pain  . Atrial Fibrillation   Reason for Consult: Atrial fibrillation  HPI: 72 y.o. male with chronic nonvalvular atrial fibrillation essential hypertension sleep apnea who has had appropriate medication management with heart rate control of atrial fibrillation with diltiazem and metoprolol digoxin combination.  The patient has not had any issues with this medication management other than lower extremity edema.  He does have furosemide for this lower extremity edema.  The patient then has been on appropriate anticoagulation for further risk reduction and stroke with no bleeding complications.  The patient does not have any apparent coronary artery disease or previous myocardial infarction or heart failure.  The patient was having issues with unsteadiness and fell and hurt his hip but no evidence of fracture.  At this time he had atrial fibrillation with more rapid rate which was required to use a diltiazem drip.  This was likely secondary to pain and situation.  His current heart rate is 100 bpm although he has had no evidence of re-addition of his previous home medications.  Currently he is hemodynamically stable with no evidence of heart failure or chest pain  Past Medical History:  Diagnosis Date  . A-fib ([place])   . Benign prostatic hyperplasia   . Cardiomyopathy, secondary ([place])   . GERD (gastroesophageal reflux disease)   . Gout   . Hyperlipidemia   . Hypertension   . Obesity   . Sleep apnea       Surgical History:  Past Surgical History:  Procedure Laterality Date  . APPENDECTOMY    . COLONOSCOPY    . COLONOSCOPY WITH PROPOFOL N/A 11/29/2014   Procedure:  COLONOSCOPY WITH PROPOFOL;  Surgeon: Hulen Luster, MD;  Location[facility] ENDOSCOPY;  Service: Gastroenterology;  Laterality: N/A;  . ESOPHAGOGASTRODUODENOSCOPY    . JOINT REPLACEMENT     right and left hip     Home Meds: Prior to Admission medications   Medication Sig Start Date End Date Taking? Authorizing Provider  allopurinol (ZYLOPRIM) 300 MG tablet Take 300 mg by mouth daily.   Yes [provider]  amiodarone (PACERONE) 400 MG tablet Take 1 tablet (400 mg total) by mouth daily. Patient taking differently: Take 400 mg by mouth 2 (two) times daily.  10/12/16  Yes Sainani, Belia Heman, MD  apixaban (ELIQUIS) 5 MG TABS tablet Take 5 mg by mouth 2 (two) times daily.   Yes [provider]  lisinopril (PRINIVIL,ZESTRIL) 5 MG tablet Take 5 mg by mouth daily.   Yes [provider]  metoprolol succinate (TOPROL-XL) 50 MG 24 hr tablet Take 1 tablet by mouth daily. 04/15/17  Yes [provider]  potassium chloride SA (K-DUR,KLOR-CON) 20 MEQ tablet Take 20 mEq by mouth 2 (two) times daily.   Yes [provider]  thiamine 50 MG tablet Take 1 tablet (50 mg total) by mouth daily. 08/20/16  Yes Fritzi Mandes, MD  traMADol (ULTRAM) 50 MG tablet Take 50 mg by mouth at bedtime.  04/28/17  Yes [provider]  benzocaine (ORAJEL) 10 % mucosal gel Use as directed in the mouth or throat 4 (four) times daily as needed for mouth pain. Patient not taking: Reported on 05/10/2017 08/20/16  Fritzi Mandes, MD  digoxin (LANOXIN) 0.125 MG tablet Take 1 tablet (125 mcg total) by mouth daily. Patient not taking: Reported on 05/10/2017 10/12/16 10/12/17  Henreitta Leber, MD  diltiazem (CARDIZEM CD) 360 MG 24 hr capsule Take 1 capsule (360 mg total) by mouth daily. Patient not taking: Reported on 05/10/2017 08/20/16   Fritzi Mandes, MD  folic acid (FOLVITE) 1 MG tablet Take 1 tablet (1 mg total) by mouth daily. Patient not taking: Reported on 05/10/2017 08/20/16   Fritzi Mandes, MD  furosemide  (LASIX) 40 MG tablet Take 40 mg by mouth daily.    [provider]    Inpatient Medications:  . amiodarone  400 mg Oral BID  . apixaban  5 mg Oral BID  . diltiazem  360 mg Oral Daily  . furosemide  40 mg Oral Daily  . insulin aspart  0-5 Units Subcutaneous QHS  . insulin aspart  0-9 Units Subcutaneous TID WC  . lisinopril  5 mg Oral Daily  . metoprolol succinate  50 mg Oral Daily   . diltiazem (CARDIZEM) infusion 15 mg/hr (05/11/17 0841)    Allergies:  Allergies  Allergen Reactions  . Oxycodone Other (See Comments)    Hallucinations  . Cardura [Doxazosin Mesylate]   . Darvon [Propoxyphene]   . Penicillins     Has patient had a PCN reaction causing immediate rash, facial/tongue/throat swelling, SOB or lightheadedness with hypotension: Yes Has patient had a PCN reaction causing severe rash involving mucus membranes or skin necrosis: No Has patient had a PCN reaction that required hospitalization: No Has patient had a PCN reaction occurring within the last 10 years: No If all of the above answers are "NO", then may proceed with Cephalosporin use.  Marland Kitchen Requip [Ropinirole Hcl]   . Septra [Sulfamethoxazole-Trimethoprim]     Social History   Socioeconomic History  . Marital status: Widowed    Spouse name: Not on file  . Number of children: Not on file  . Years of education: Not on file  . Highest education level: Not on file  Social Needs  . Financial resource strain: Not on file  . Food insecurity - worry: Not on file  . Food insecurity - inability: Not on file  . Transportation needs - medical: Not on file  . Transportation needs - non-medical: Not on file  Occupational History  . Not on file  Tobacco Use  . Smoking status: Current Every Day Smoker    Types: Cigars  . Smokeless tobacco: Never Used  Substance and Sexual Activity  . Alcohol use: Yes  . Drug use: No  . Sexual activity: Not on file  Other Topics Concern  . Not on file  Social History Narrative   . Not on file     Family History  Problem Relation Age of Onset  . Hypertension Mother      Review of Systems Positive for hip pain shortness of breath Negative for: General:  chills, fever, night sweats or weight changes.  Cardiovascular: PND orthopnea syncope dizziness  Dermatological skin lesions rashes Respiratory: Cough congestion Urologic: Frequent urination urination at night and hematuria Abdominal: negative for nausea, vomiting, diarrhea, bright red blood per rectum, melena, or hematemesis Neurologic: negative for visual changes, and/or hearing changes  All other systems reviewed and are otherwise negative except as noted above.  Labs: Recent Labs    05/10/17 1532  TROPONINI <0.03   Lab Results  Component Value Date   WBC 8.1 05/11/2017   HGB  16.3 05/11/2017   HCT 48.7 05/11/2017   MCV 105.3 (H) 05/11/2017   PLT 187 05/11/2017    Recent Labs  Lab 05/11/17 0532  NA 137  K 3.2*  CL 100*  CO2 27  BUN 13  CREATININE 0.81  CALCIUM 8.4*  GLUCOSE 99   No results found for: CHOL, HDL, LDLCALC, TRIG No results found for: DDIMER  Radiology/Studies:  Dg Chest 2 View  Result Date: 05/10/2017 CLINICAL DATA:  Tachycardia. History of atrial fibrillation and hypertension. EXAM: CHEST  2 VIEW COMPARISON:  Radiographs 10/07/2016 and 08/11/2016. CT 08/10/2016. Thoracic MRI 10/09/2016. FINDINGS: The heart size and mediastinal contours are stable. There is mild aortic atherosclerosis. There is mild chronic scarring at both lung bases. No evidence of airspace disease, edema, significant pleural effusion or pneumothorax. Chronic compression deformities at T3 and T12, stable. Superior endplate compression fracture at L2 is new from MRI of 7 months ago. IMPRESSION: No acute cardiopulmonary process. Interval L2 compression fracture since June 2018. Electronically Signed   By: Richardean Sale M.D.   On: 05/10/2017 16:11   Dg Hip Unilat W Or Wo Pelvis 2-3 Views Left  Result  Date: 05/10/2017 CLINICAL DATA:  Left hip pain.  Fall on Friday EXAM: DG HIP (WITH OR WITHOUT PELVIS) 2-3V LEFT COMPARISON:  01/22/2013 FINDINGS: Bilateral hip replacements noted. No acute bony abnormality. Specifically, no fracture, subluxation, or dislocation. IMPRESSION: No acute bony abnormality.  Bilateral hip replacements. Electronically Signed   By: Rolm Baptise M.D.   On: 05/10/2017 16:41    EKG: Atrial fibrillation with rapid ventricular rate  Weights: Filed Weights   05/10/17 1530 05/11/17 0113  Weight: 205 lb (93 kg) 205 lb 12.8 oz (93.4 kg)     Physical Exam: Blood pressure (!) 148/84, pulse (!) 124, temperature 98.4 F (36.9 C), temperature source Oral, resp. rate 20, height 5\' 8"  (1.727 m), weight 205 lb 12.8 oz (93.4 kg), SpO2 93 %. Body mass index is 31.29 kg/m. General: Well developed, well nourished, in no acute distress. Head eyes ears nose throat: Normocephalic, atraumatic, sclera non-icteric, no xanthomas, nares are without discharge. No apparent thyromegaly and/or mass  Lungs: Normal respiratory effort.  Some wheezes, no rales, no rhonchi.  Heart: Irregular with normal S1 S2. no murmur gallop, no rub, PMI is normal size and placement, carotid upstroke normal without bruit, jugular venous pressure is normal Abdomen: Soft, non-tender, non-distended with normoactive bowel sounds. No hepatomegaly. No rebound/guarding. No obvious abdominal masses. Abdominal aorta is normal size without bruit Extremities: 1+ edema. no cyanosis, no clubbing, no ulcers  Peripheral : 2+ bilateral upper extremity pulses, 2+ bilateral femoral pulses, 2+ bilateral dorsal pedal pulse Neuro: Alert and oriented. No facial asymmetry. No focal deficit. Moves all extremities spontaneously. Musculoskeletal: Normal muscle tone without kyphosis Psych:  Responds to questions appropriately with a normal affect.    Assessment: 72 year old male with chronic nonvalvular atrial fibrillation sleep apnea having  a fall and hip injury without evidence of heart failure or myocardial infarction needing further medical management  Plan: 1.  Continue supportive care for her hip injury and fall 2.  Reinstatement of outpatient medication management for atrial fibrillation heart rate control including 360 mg of diltiazem CD and metoprolol between 50 and 100 mg and consider reinstatement of digoxin as necessary. 3.  Discontinuation of diltiazem drip 4.  Amiodarone not necessary at this time due to rate controlling method rather than rhythm controlling method of treatment of atrial fibrillation which appears to be chronic 5.  Continue anticoagulation for further risk reduction and stroke with atrial fibrillation without change 6.  Adjustments of medications listed above for heart rate at rest of 60-90 bpm and 7.  Okay for discharge to home from the cardiac standpoint with follow-up next week for adjustments of medication management  Signed, Corey Skains M.D. [facility] 05/11/2017, 8:45 AM

## 2017-05-11 NOTE — Progress Notes (Signed)
I agree with the student's assessment or have made corrections.

## 2017-05-11 NOTE — Plan of Care (Signed)
Patient is on bedrest for hip fracture. Encourage patient to participate in physical therapy session.

## 2017-05-11 NOTE — Plan of Care (Signed)
Patient admitted at [facility], complaining of hip pain. Heart rate is is unstable. Increased drip rate for cardizem. Patient has received pain medication. Patient profile has been completed. Will continue to monitor and assess.

## 2017-05-11 NOTE — Care Management (Signed)
Patient placed in observation from home after sustaining a fall.  Was found to be in atrial fib with RVR.  Current with PCP- Dr Ginette Pitman.  has had bilateral hip surgery and complains of pain bilateral. Requested physical therapy consult.  Anticipate cardizem drip to be discontinued today per cardiology if heart rate remains stable.  Patient may benefit from home health nurse and possibly therapy.  Patient will consider

## 2017-05-11 NOTE — ED Notes (Signed)
Pt cardizem rate moved to 12.5 d/t meeting proper perameters; HR 106-114 and  SBP > 110

## 2017-05-12 DIAGNOSIS — E119 Type 2 diabetes mellitus without complications: Secondary | ICD-10-CM | POA: Diagnosis not present

## 2017-05-12 DIAGNOSIS — M25552 Pain in left hip: Secondary | ICD-10-CM | POA: Diagnosis not present

## 2017-05-12 DIAGNOSIS — I480 Paroxysmal atrial fibrillation: Secondary | ICD-10-CM | POA: Diagnosis not present

## 2017-05-12 DIAGNOSIS — I4891 Unspecified atrial fibrillation: Secondary | ICD-10-CM | POA: Diagnosis not present

## 2017-05-12 DIAGNOSIS — I10 Essential (primary) hypertension: Secondary | ICD-10-CM | POA: Diagnosis not present

## 2017-05-12 DIAGNOSIS — R55 Syncope and collapse: Secondary | ICD-10-CM | POA: Diagnosis not present

## 2017-05-12 LAB — GLUCOSE, CAPILLARY
GLUCOSE-CAPILLARY: 113 mg/dL — AB (ref 65–99)
GLUCOSE-CAPILLARY: 126 mg/dL — AB (ref 65–99)
GLUCOSE-CAPILLARY: 167 mg/dL — AB (ref 65–99)
GLUCOSE-CAPILLARY: 91 mg/dL (ref 65–99)

## 2017-05-12 LAB — BASIC METABOLIC PANEL
Anion gap: 9 (ref 5–15)
BUN: 24 mg/dL — ABNORMAL HIGH (ref 6–20)
CALCIUM: 8.4 mg/dL — AB (ref 8.9–10.3)
CHLORIDE: 97 mmol/L — AB (ref 101–111)
CO2: 26 mmol/L (ref 22–32)
CREATININE: 1.19 mg/dL (ref 0.61–1.24)
GFR calc non Af Amer: 59 mL/min — ABNORMAL LOW (ref >60)
Glucose, Bld: 132 mg/dL — ABNORMAL HIGH (ref 65–99)
Potassium: 3.4 mmol/L — ABNORMAL LOW (ref 3.5–5.1)
SODIUM: 132 mmol/L — AB (ref 135–145)

## 2017-05-12 MED ORDER — MAGNESIUM SULFATE 2 GM/50ML IV SOLN
2.0000 g | Freq: Once | INTRAVENOUS | Status: AC
  Administered 2017-05-12: 2 g via INTRAVENOUS
  Filled 2017-05-12: qty 50

## 2017-05-12 MED ORDER — APIXABAN 5 MG PO TABS
5.0000 mg | ORAL_TABLET | Freq: Two times a day (BID) | ORAL | Status: DC
  Administered 2017-05-12 – 2017-05-13 (×3): 5 mg via ORAL
  Filled 2017-05-12 (×4): qty 1

## 2017-05-12 MED ORDER — POTASSIUM CHLORIDE CRYS ER 20 MEQ PO TBCR
20.0000 meq | EXTENDED_RELEASE_TABLET | Freq: Two times a day (BID) | ORAL | Status: DC
  Administered 2017-05-12 – 2017-05-13 (×2): 20 meq via ORAL
  Filled 2017-05-12 (×2): qty 1

## 2017-05-12 MED ORDER — POTASSIUM CHLORIDE CRYS ER 20 MEQ PO TBCR
20.0000 meq | EXTENDED_RELEASE_TABLET | Freq: Once | ORAL | Status: AC
  Administered 2017-05-12: 20 meq via ORAL
  Filled 2017-05-12: qty 1

## 2017-05-12 NOTE — Progress Notes (Signed)
[facility] Encounter Note  Patient: Brendan Edwards / Admit Date: 05/10/2017 / Date of Encounter: 05/12/2017, 8:20 AM   Subjective: Patient with continued hip pain.  Rehabilitation and therapy have worked well with him initially.  The patient's heart rate has much better control with changes in medication management.  No evidence of chest discomfort or congestive heart failure  Review of Systems: Positive for: Hip pain Negative for: Vision change, hearing change, syncope, dizziness, nausea, vomiting,diarrhea, bloody stool, stomach pain, cough, congestion, diaphoresis, urinary frequency, urinary pain,skin lesions, skin rashes Others previously listed  Objective: Telemetry: Atrial fibrillation with controlled ventricular rate Physical Exam: Blood pressure 137/88, pulse 97, temperature 98.3 F (36.8 C), resp. rate 18, height 5\' 8"  (1.727 m), weight 208 lb (94.3 kg), SpO2 91 %. Body mass index is 31.63 kg/m. General: Well developed, well nourished, in no acute distress. Head: Normocephalic, atraumatic, sclera non-icteric, no xanthomas, nares are without discharge. Neck: No apparent masses Lungs: Normal respirations with no wheezes, no rhonchi, no rales , no crackles   Heart: Irregular rate and rhythm, normal S1 S2, no murmur, no rub, no gallop, PMI is normal size and placement, carotid upstroke normal without bruit, jugular venous pressure normal Abdomen: Soft, non-tender, non-distended with normoactive bowel sounds. No hepatosplenomegaly. Abdominal aorta is normal size without bruit Extremities: Trace edema, no clubbing, no cyanosis, no ulcers,  Peripheral: 2+ radial, 2+ femoral, 2+ dorsal pedal pulses Neuro: Alert and oriented. Moves all extremities spontaneously. Psych:  Responds to questions appropriately with a normal affect.   Intake/Output Summary (Last 24 hours) at 05/12/2017 0820 Last data filed at 05/12/2017 0500 Gross per 24 hour  Intake 1262.46 ml  Output  864 ml  Net 398.46 ml    Inpatient Medications:  . amiodarone  400 mg Oral BID  . diltiazem  60 mg Oral Q8H  . furosemide  40 mg Oral Daily  . insulin aspart  0-5 Units Subcutaneous QHS  . insulin aspart  0-9 Units Subcutaneous TID WC  . ketorolac  30 mg Intravenous Q8H  . lisinopril  5 mg Oral Daily  . metoprolol succinate  50 mg Oral Daily  . metoprolol tartrate  25 mg Oral BID  . sodium chloride flush  3 mL Intravenous Q12H  . traZODone  50 mg Oral QHS   Infusions:   Labs: Recent Labs    05/11/17 0532 05/12/17 0415  NA 137 132*  K 3.2* 3.4*  CL 100* 97*  CO2 27 26  GLUCOSE 99 132*  BUN 13 24*  CREATININE 0.81 1.19  CALCIUM 8.4* 8.4*  MG 1.5*  --    No results for input(s): AST, ALT, ALKPHOS, BILITOT, PROT, ALBUMIN in the last 72 hours. Recent Labs    05/10/17 1532 05/11/17 0532  WBC 9.2 8.1  HGB 17.3 16.3  HCT 51.4 48.7  MCV 104.5* 105.3*  PLT 229 187   Recent Labs    05/10/17 1532  TROPONINI <0.03   Invalid input(s): POCBNP No results for input(s): HGBA1C in the last 72 hours.   Weights: Filed Weights   05/10/17 1530 05/11/17 0113 05/12/17 0331  Weight: 205 lb (93 kg) 205 lb 12.8 oz (93.4 kg) 208 lb (94.3 kg)     Radiology/Studies:  Dg Chest 2 View  Result Date: 05/10/2017 CLINICAL DATA:  Tachycardia. History of atrial fibrillation and hypertension. EXAM: CHEST  2 VIEW COMPARISON:  Radiographs 10/07/2016 and 08/11/2016. CT 08/10/2016. Thoracic MRI 10/09/2016. FINDINGS: The heart size and mediastinal contours are  stable. There is mild aortic atherosclerosis. There is mild chronic scarring at both lung bases. No evidence of airspace disease, edema, significant pleural effusion or pneumothorax. Chronic compression deformities at T3 and T12, stable. Superior endplate compression fracture at L2 is new from MRI of 7 months ago. IMPRESSION: No acute cardiopulmonary process. Interval L2 compression fracture since June 2018. Electronically Signed   By:  Richardean Sale M.D.   On: 05/10/2017 16:11   Ct Hip Left Wo Contrast  Result Date: 05/11/2017 CLINICAL DATA:  The patient was having issues with unsteadiness and fell and hurt his left hip but no evidence of fracture. EXAM: CT OF THE LEFT HIP WITHOUT CONTRAST TECHNIQUE: Multidetector CT imaging of the left hip was performed according to the standard protocol. Multiplanar CT image reconstructions were also generated. COMPARISON:  None. FINDINGS: Bones/Joint/Cartilage Left total hip arthroplasty. Nondisplaced fracture of the anterior acetabulum adjacent to the acetabular cup component extending into the superior pubic ramus-acetabular junction. No other fracture or dislocation. Ligaments Ligaments are suboptimally evaluated by CT. Muscles and Tendons Muscles are normal. No muscle atrophy. Small amount of hemorrhagic fluid in the perivesicular space. Soft tissue No fluid collection or hematoma. No soft tissue mass. Peripheral vascular atherosclerotic disease. IMPRESSION: 1. Left total hip arthroplasty. Nondisplaced fracture of the anterior acetabulum adjacent to the acetabular cup component extending into the superior pubic ramus-acetabular junction. Electronically Signed   By: Kathreen Devoid   On: 05/11/2017 14:06   Dg Hip Unilat W Or Wo Pelvis 2-3 Views Left  Result Date: 05/10/2017 CLINICAL DATA:  Left hip pain.  Fall on Friday EXAM: DG HIP (WITH OR WITHOUT PELVIS) 2-3V LEFT COMPARISON:  01/22/2013 FINDINGS: Bilateral hip replacements noted. No acute bony abnormality. Specifically, no fracture, subluxation, or dislocation. IMPRESSION: No acute bony abnormality.  Bilateral hip replacements. Electronically Signed   By: Rolm Baptise M.D.   On: 05/10/2017 16:41     Assessment and Recommendation  72 y.o. male with chronic nonvalvular atrial fibrillation having fall and hip injury without need for surgical intervention now having better heart rate control with additional medication management without evidence  of heart failure or myocardial infarction. 1.  Continue diltiazem metoprolol combination for heart rate control of chronic nonvalvular atrial fibrillation. 2.  Amiodarone likely not to help a great deal due to atrial fibrillation being chronic and unlikely to convert to normal sinus rhythm 3.  Continue anticoagulation for further risk reduction and stroke with atrial fibrillation 4.  No further cardiac diagnostics necessary at this time 5.  Okay for ambulation from the cardiac standpoint and if ambulating well with no significant symptoms okay for discharge to home with follow-up next week for further adjustments of medication management Signed, Serafina Royals M.D. FACC

## 2017-05-12 NOTE — Care Management Obs Status (Deleted)
[place] NOTIFICATION   Patient Details  Name: Brendan Edwards MRN: [id number] Date of Birth: [birth year]   Medicare Observation Status Notification Given:  Yes    Katrina Stack, RN 05/12/2017, 2:35 PM

## 2017-05-12 NOTE — Clinical Social Work Note (Signed)
CSW presented bed offers to patient, and he chose [facility] [place] for short term rehab.  CSW contacted [facility] and they can accept patient once insurance has been approved.  CSW spoke to [employer] and they said clinical information was sent to the medical director to review.  CSW awaiting for authorization from insurance company.  Jones Broom. Norval Morton, MSW, [place]  05/12/2017 3:44 PM

## 2017-05-12 NOTE — Progress Notes (Signed)
[place] at [facility]                                                                                                                                                                                  Patient Demographics   Brendan Edwards, is a 72 y.o. male, DOB - [birth year], PIR:[id number]  Admit date - 05/10/2017   Admitting Physician Lance Coon, MD  Outpatient Primary MD for the patient is Tracie Harrier, MD   LOS - 0  Subjective:  Pain in the hip improved appreciate orthopedic input heart rate improved   Review of Systems:   CONSTITUTIONAL: No documented fever. No fatigue, weakness. No weight gain, no weight loss.  EYES: No blurry or double vision.  ENT: No tinnitus. No postnasal drip. No redness of the oropharynx.  RESPIRATORY: No cough, no wheeze, no hemoptysis. No dyspnea.  CARDIOVASCULAR: No chest pain. No orthopnea. No palpitations. No syncope.  GASTROINTESTINAL: No nausea, no vomiting or diarrhea. No abdominal pain. No melena or hematochezia.  GENITOURINARY: No dysuria or hematuria.  ENDOCRINE: No polyuria or nocturia. No heat or cold intolerance.  HEMATOLOGY: No anemia. No bruising. No bleeding.  INTEGUMENTARY: No rashes. No lesions.  MUSCULOSKELETAL: No arthritis. No swelling. No gout.  Positive hip pain NEUROLOGIC: No numbness, tingling, or ataxia. No seizure-type activity.  PSYCHIATRIC: No anxiety. No insomnia. No ADD.    Vitals:   Vitals:   05/12/17 0800 05/12/17 0939 05/12/17 1050 05/12/17 1346  BP: 137/88  119/65 133/90  Pulse: 97 85 62 64  Resp: 18     Temp: 98.3 F (36.8 C)  97.8 F (36.6 C)   TempSrc:   Oral   SpO2: 91% 92% 97%   Weight:      Height:        Wt Readings from Last 3 Encounters:  05/12/17 208 lb (94.3 kg)  10/08/16 207 lb 11.2 oz (94.2 kg)  08/20/16 202 lb 11.8 oz (92 kg)     Intake/Output Summary (Last 24 hours) at 05/12/2017 1407 Last data filed at 05/12/2017 1101 Gross per 24 hour  Intake  545.46 ml  Output 650 ml  Net -104.54 ml    Physical Exam:   GENERAL: Pleasant-appearing in no apparent distress.  HEAD, EYES, EARS, NOSE AND THROAT: Atraumatic, normocephalic. Extraocular muscles are intact. Pupils equal and reactive to light. Sclerae anicteric. No conjunctival injection. No oro-pharyngeal erythema.  NECK: Supple. There is no jugular venous distention. No bruits, no lymphadenopathy, no thyromegaly.  HEART: Irregularly irregular. No murmurs, no rubs, no clicks.  LUNGS: Clear to auscultation bilaterally. No rales or rhonchi. No wheezes.  ABDOMEN: Soft, flat, nontender, nondistended. Has good bowel  sounds. No hepatosplenomegaly appreciated.  EXTREMITIES: No evidence of any cyanosis, clubbing, or peripheral edema.  +2 pedal and radial pulses bilaterally.  Left-sided hip pain NEUROLOGIC: The patient is alert, awake, and oriented x3 with no focal motor or sensory deficits appreciated bilaterally.  SKIN: Moist and warm with no rashes appreciated.  Psych: Not anxious, depressed LN: No inguinal LN enlargement    Antibiotics   Anti-infectives (From admission, onward)   None      Medications   Scheduled Meds: . amiodarone  400 mg Oral BID  . apixaban  5 mg Oral BID  . diltiazem  60 mg Oral Q8H  . furosemide  40 mg Oral Daily  . insulin aspart  0-5 Units Subcutaneous QHS  . insulin aspart  0-9 Units Subcutaneous TID WC  . ketorolac  30 mg Intravenous Q8H  . lisinopril  5 mg Oral Daily  . metoprolol succinate  50 mg Oral Daily  . metoprolol tartrate  25 mg Oral BID  . sodium chloride flush  3 mL Intravenous Q12H  . traZODone  50 mg Oral QHS   Continuous Infusions: PRN Meds:.acetaminophen **OR** acetaminophen, alum & mag hydroxide-simeth, ondansetron **OR** ondansetron (ZOFRAN) IV, oxyCODONE-acetaminophen   Data Review:   Micro Results No results found for this or any previous visit (from the past 240 hour(s)).  Radiology Reports Dg Chest 2 View  Result  Date: 05/10/2017 CLINICAL DATA:  Tachycardia. History of atrial fibrillation and hypertension. EXAM: CHEST  2 VIEW COMPARISON:  Radiographs 10/07/2016 and 08/11/2016. CT 08/10/2016. Thoracic MRI 10/09/2016. FINDINGS: The heart size and mediastinal contours are stable. There is mild aortic atherosclerosis. There is mild chronic scarring at both lung bases. No evidence of airspace disease, edema, significant pleural effusion or pneumothorax. Chronic compression deformities at T3 and T12, stable. Superior endplate compression fracture at L2 is new from MRI of 7 months ago. IMPRESSION: No acute cardiopulmonary process. Interval L2 compression fracture since June 2018. Electronically Signed   By: Richardean Sale M.D.   On: 05/10/2017 16:11   Ct Hip Left Wo Contrast  Result Date: 05/11/2017 CLINICAL DATA:  The patient was having issues with unsteadiness and fell and hurt his left hip but no evidence of fracture. EXAM: CT OF THE LEFT HIP WITHOUT CONTRAST TECHNIQUE: Multidetector CT imaging of the left hip was performed according to the standard protocol. Multiplanar CT image reconstructions were also generated. COMPARISON:  None. FINDINGS: Bones/Joint/Cartilage Left total hip arthroplasty. Nondisplaced fracture of the anterior acetabulum adjacent to the acetabular cup component extending into the superior pubic ramus-acetabular junction. No other fracture or dislocation. Ligaments Ligaments are suboptimally evaluated by CT. Muscles and Tendons Muscles are normal. No muscle atrophy. Small amount of hemorrhagic fluid in the perivesicular space. Soft tissue No fluid collection or hematoma. No soft tissue mass. Peripheral vascular atherosclerotic disease. IMPRESSION: 1. Left total hip arthroplasty. Nondisplaced fracture of the anterior acetabulum adjacent to the acetabular cup component extending into the superior pubic ramus-acetabular junction. Electronically Signed   By: Kathreen Devoid   On: 05/11/2017 14:06   Dg Hip  Unilat W Or Wo Pelvis 2-3 Views Left  Result Date: 05/10/2017 CLINICAL DATA:  Left hip pain.  Fall on Friday EXAM: DG HIP (WITH OR WITHOUT PELVIS) 2-3V LEFT COMPARISON:  01/22/2013 FINDINGS: Bilateral hip replacements noted. No acute bony abnormality. Specifically, no fracture, subluxation, or dislocation. IMPRESSION: No acute bony abnormality.  Bilateral hip replacements. Electronically Signed   By: Rolm Baptise M.D.   On: 05/10/2017 16:41  CBC Recent Labs  Lab 05/10/17 1532 05/11/17 0532  WBC 9.2 8.1  HGB 17.3 16.3  HCT 51.4 48.7  PLT 229 187  MCV 104.5* 105.3*  MCH 35.1* 35.2*  MCHC 33.6 33.5  RDW 14.9* 15.0*    Chemistries  Recent Labs  Lab 05/10/17 1532 05/11/17 0532 05/12/17 0415  NA 137 137 132*  K 3.8 3.2* 3.4*  CL 99* 100* 97*  CO2 27 27 26   GLUCOSE 142* 99 132*  BUN 14 13 24*  CREATININE 0.83 0.81 1.19  CALCIUM 8.7* 8.4* 8.4*  MG  --  1.5*  --    ------------------------------------------------------------------------------------------------------------------ estimated creatinine clearance is 62.5 mL/min (by C-G formula based on SCr of 1.19 mg/dL). ------------------------------------------------------------------------------------------------------------------ No results for input(s): HGBA1C in the last 72 hours. ------------------------------------------------------------------------------------------------------------------ No results for input(s): CHOL, HDL, LDLCALC, TRIG, CHOLHDL, LDLDIRECT in the last 72 hours. ------------------------------------------------------------------------------------------------------------------ No results for input(s): TSH, T4TOTAL, T3FREE, THYROIDAB in the last 72 hours.  Invalid input(s): FREET3 ------------------------------------------------------------------------------------------------------------------ No results for input(s): VITAMINB12, FOLATE, FERRITIN, TIBC, IRON, RETICCTPCT in the last 72  hours.  Coagulation profile No results for input(s): INR, PROTIME in the last 168 hours.  No results for input(s): DDIMER in the last 72 hours.  Cardiac Enzymes Recent Labs  Lab 05/10/17 1532  TROPONINI <0.03   ------------------------------------------------------------------------------------------------------------------ Invalid input(s): POCBNP    Assessment & Plan  Patient is a 72 year old who presented to the emergency room with complaint of hip pain Principal Problem:  1.  Atrial fibrillation with RVR ([place]) -continue oral Cardizem and metoprolol resume anticoagulation 2.  Left hip pain CT scan shows a nondisplaced fracture Seen by orthopedics specific instruction for activity given patient will need rehab Pain control 3.   Diabetes ([place]) -sliding scale insulin with corresponding glucose checks 4.   HTN (hypertension) -continue therapy with lisinopril and metoprolol 5.     Hypokalemia replace potassium        Code Status Orders  (From admission, onward)        Start     Ordered   05/11/17 0120  Full code  Continuous     05/11/17 0119    Code Status History    Date Active Date Inactive Code Status Order ID Comments User Context   10/07/2016 16:40 10/12/2016 19:09 Full Code [id number]  Vaughan Basta, MD ED   08/10/2016 20:17 08/20/2016 15:17 Full Code [id number]  Theodoro Grist, MD ED        Disposition to skilled nursing facility likely tomorrow once insurance authorizes  Consults cardiology orthopedics  DVT Prophylaxis SCDs  Lab Results  Component Value Date   PLT 187 05/11/2017     Time Spent in minutes   35 minutes greater than 50% of time spent in care coordination and counseling patient regarding the condition and plan of care.   Dustin Flock M.D on 05/12/2017 at 2:07 PM  Between 7am to 6pm - Pager - [phone]  After 6pm go to [email or web address] - password [email or web address] [place] [facility]   Office  [phone]

## 2017-05-12 NOTE — NC FL2 (Signed)
[place] LEVEL OF CARE SCREENING TOOL     IDENTIFICATION  Patient Name: Brendan Edwards Birthdate: 1945/10/13 Sex: male Admission Date (Current Location): 05/10/2017  [place] and [place] Number:  Engineering geologist and Address:  [facility], [address] [place], [place] [address]      Provider Number: [id number]  Attending Physician Name and Address:  Dustin Flock, MD  Relative Name and Phone Number:     [place], Steidle Son [phone]   [name] [phone]   [place], Arriaga [phone]   [name],[name] [phone]  [phone]     Current Level of Care: Hospital Recommended Level of Care: [place] Prior Approval Number:    Date Approved/Denied:   PASRR Number:    Discharge Plan: SNF    Current Diagnoses: Patient Active Problem List   Diagnosis Date Noted  . HTN (hypertension) 05/10/2017  . HLD (hyperlipidemia) 05/10/2017  . GERD (gastroesophageal reflux disease) 05/10/2017  . Atrial fibrillation with RVR ([place]) 08/10/2016  . Diabetes ([place]) 08/10/2016  . Leukocytosis 08/10/2016  . Fall 08/10/2016  . Alcoholic hepatitis 83/38/2505  . Alcohol withdrawal ([place]) 08/10/2016  . Tobacco abuse counseling 08/10/2016    Orientation RESPIRATION BLADDER Height & Weight     Self, Time, Situation, Place  Normal Continent Weight: 208 lb (94.3 kg) Height:  5\' 8"  (172.7 cm)  BEHAVIORAL SYMPTOMS/MOOD NEUROLOGICAL BOWEL NUTRITION STATUS      Continent Diet(Heart Healthy Carb modified)  AMBULATORY STATUS COMMUNICATION OF NEEDS Skin   Limited Assist Verbally Normal                       Personal Care Assistance Level of Assistance  Bathing, Feeding, Dressing Bathing Assistance: Limited assistance Feeding assistance: Independent Dressing Assistance: Limited assistance     Functional Limitations Info  Sight, Hearing, Speech Sight Info: Adequate Hearing Info: Adequate Speech Info:  Adequate    SPECIAL CARE FACTORS FREQUENCY  PT (By licensed PT)     PT Frequency: 5x a week              Contractures Contractures Info: Not present    Additional Factors Info  Code Status, Allergies, Psychotropic, Insulin Sliding Scale Code Status Info: Full Code Allergies Info: OXYCODONE, CARDURA DOXAZOSIN MESYLATE, DARVON PROPOXYPHENE, PENICILLINS, REQUIP ROPINIROLE HCL, SEPTRA SULFAMETHOXAZOLE-TRIMETHOPRIM Psychotropic Info: traZODone (DESYREL) tablet 50 mg  Insulin Sliding Scale Info: insulin aspart (novoLOG) injection 0-5 Units        Current Medications (05/12/2017):  This is the current hospital active medication list Current Facility-Administered Medications  Medication Dose Route Frequency Provider Last Rate Last Dose  . acetaminophen (TYLENOL) tablet 650 mg  650 mg Oral Q6H PRN Lance Coon, MD       Or  . acetaminophen (TYLENOL) suppository 650 mg  650 mg Rectal Q6H PRN Lance Coon, MD      . alum & mag hydroxide-simeth (MAALOX/MYLANTA) 200-200-20 MG/5ML suspension 30 mL  30 mL Oral Q4H PRN Dustin Flock, MD   30 mL at 05/11/17 2302  . amiodarone (PACERONE) tablet 400 mg  400 mg Oral BID Lance Coon, MD   400 mg at 05/12/17 1051  . apixaban (ELIQUIS) tablet 5 mg  5 mg Oral BID Dustin Flock, MD   5 mg at 05/12/17 1110  . diltiazem (CARDIZEM) tablet 60 mg  60 mg Oral Q8H Dustin Flock, MD   60 mg at 05/12/17 0547  . furosemide (LASIX) tablet 40 mg  40 mg Oral  Daily Lance Coon, MD   40 mg at 05/12/17 1051  . insulin aspart (novoLOG) injection 0-5 Units  0-5 Units Subcutaneous QHS Lance Coon, MD      . insulin aspart (novoLOG) injection 0-9 Units  0-9 Units Subcutaneous TID WC Lance Coon, MD   1 Units at 05/11/17 1225  . ketorolac (TORADOL) 30 MG/ML injection 30 mg  30 mg Intravenous Q8H Dustin Flock, MD   30 mg at 05/12/17 0547  . lisinopril (PRINIVIL,ZESTRIL) tablet 5 mg  5 mg Oral Daily Lance Coon, MD   5 mg at 05/12/17 1051  . metoprolol  succinate (TOPROL-XL) 24 hr tablet 50 mg  50 mg Oral Daily Lance Coon, MD   50 mg at 05/12/17 1051  . metoprolol tartrate (LOPRESSOR) tablet 25 mg  25 mg Oral BID Dustin Flock, MD   25 mg at 05/12/17 1051  . ondansetron (ZOFRAN) tablet 4 mg  4 mg Oral Q6H PRN Lance Coon, MD       Or  . ondansetron [facility]) injection 4 mg  4 mg Intravenous Q6H PRN Lance Coon, MD      . oxyCODONE-acetaminophen (PERCOCET/ROXICET) 5-325 MG per tablet 1 tablet  1 tablet Oral Q6H PRN Dustin Flock, MD   1 tablet at 05/11/17 1226  . sodium chloride flush (NS) 0.9 % injection 3 mL  3 mL Intravenous Q12H Dustin Flock, MD   3 mL at 05/12/17 1052  . traZODone (DESYREL) tablet 50 mg  50 mg Oral Corwin Levins, MD   50 mg at 05/11/17 2123     Discharge Medications: Please see discharge summary for a list of discharge medications.  Relevant Imaging Results:  Relevant Lab Results:   Additional Information SSN [id number]  Ross Ludwig, [place]

## 2017-05-12 NOTE — Clinical Social Work Note (Signed)
Clinical Social Work Assessment  Patient Details  Name: Brendan Edwards MRN: [id number] Date of Birth: Jul 25, 1945  Date of referral:  05/12/17               Reason for consult:  Facility Placement                Permission sought to share information with:  Facility Sport and exercise psychologist Permission granted to share information::  Yes, Verbal Permission Granted  Name::     Brendan Edwards Son [phone] or Brendan Edwards [phone] or Brendan Edwards [phone] or Brendan Edwards Daughter [phone]  [phone]   Agency::  SNF admissions  Relationship::     Contact Information:     Housing/Transportation Living arrangements for the past 2 months:  Single Family Home Source of Information:  Patient Patient Interpreter Needed:  None Criminal Activity/Legal Involvement Pertinent to Current Situation/Hospitalization:  No - Comment as needed Significant Relationships:  Adult Children Lives with:  Self Do you feel safe going back to the place where you live?  No Need for family participation in patient care:  No (Coment)  Care giving concerns:  Patient feels he needs some short term rehab before he is able to return back home.   Social Worker assessment / plan:  Patient is a 72 year old male who is alert and oriented x4.  Patient states he has not been to rehab before, CSW explained to patient what to expect at SNF and what the process is for being accepted to SNF for short term rehab.  CSW explained the advantages of going to SNF before going home with home health.  CSW asked patient if he wanted CSW to speak to his family about it, and he said no, he makes his own decisions.  CSW explained how insurance will pay for stay at SNF, and how CSW will get authorization for SNF.  Patient did not have any other questions and gave CSW permission to [facility] bed [facility].  Employment status:  Retired Nurse, adult PT Recommendations:  [place] / Referral to community resources:  [place]  Patient/Family's Response to care:  Patient is agreeable to going to SNF for short term rehab.  Patient/Family's Understanding of and Emotional Response to Diagnosis, Current Treatment, and Prognosis:  Patient is aware of current treatment plan and prognosis.  Emotional Assessment Appearance:  Appears older than stated age Attitude/Demeanor/Rapport:    Affect (typically observed):  Appropriate, Calm, Stable Orientation:  Oriented to Self, Oriented to Place, Oriented to  Time, Oriented to Situation Alcohol / Substance use:  Not Applicable Psych involvement (Current and /or in the community):  No (Comment)  Discharge Needs  Concerns to be addressed:  Lack of Support, Care Coordination Readmission within the last 30 days:  No Current discharge risk:  Lack of support system, Lives alone Barriers to Discharge:  Insurance Authorization   Anell Barr 05/12/2017, 4:40 PM

## 2017-05-12 NOTE — Evaluation (Addendum)
Physical Therapy Evaluation Patient Details Name: RIYANSH GERSTNER MRN: [id number] DOB: [birth year] Today's Date: 05/12/2017   History of Present Illness  Pt admitted for atrial fibrillation  and s/p fall sustained a L hip fracture.  PMH includes sleep apnea, HLD, gout, GERD, cardiomyopathy and benign prastatic hyperplasia.  Clinical Impression  Pt is a 72 year old male who lives in a one story home alone.  Pt previously used a SPC for mobility but had progressed to using a RW 1 week prior to admission.  Pt now has a foot flat TDWB status per pt's physician.  PT provided VC's for use of RW to maintain WB status and required several VC's throughout evaluation.  Pt required multiple attempts and use of bed rail to perform bed mobility but was able to complete without hand held assist.  Pt required min-mod A for STS transfer and ambulation with RW.  Pt completed ascending/descending 3 steps with mod A for management of RW and maintenance of WB status.  Pt required several rest breaks during stair negotiation.  Pt vitals remained WNL throughout evaluation.  Pt presented with WNL strength and sensation.  Pt will continue to benefit from skilled PT with focus on balance, proper use of AD, stair negotiation and functional mobility.    Follow Up Recommendations SNF    Equipment Recommendations       Recommendations for Other Services       Precautions / Restrictions Precautions Precautions: Fall Restrictions Weight Bearing Restrictions: Yes Other Position/Activity Restrictions: L LE Foot flat TDWB      Mobility  Bed Mobility Overal bed mobility: Modified Independent             General bed mobility comments: Pt able to perform bed mobility with multiple attempts, increased time and using bed rail.  Transfers Overall transfer level: Needs assistance Equipment used: Rolling walker (2 wheeled) Transfers: Sit to/from Stand Sit to Stand: Min assist         General transfer comment: PT  provided min A to initiate STS and maintain WB status.  PT provided VC's for R wt shift and proper use of RW.  Pt required multiple VC's to maintain WB status.  Ambulation/Gait Ambulation/Gait assistance: Min assist Ambulation Distance (Feet): 20 Feet Assistive device: Rolling walker (2 wheeled)     Gait velocity interpretation: Below normal speed for age/gender General Gait Details: Pt presented with step to gait with foot flat TDWB and RW.  PT provided multiple VC's for R wt shift and use of UE to maintain WB status.  Stairs Stairs: Yes Stairs assistance: Mod assist Stair Management: One rail Right;With walker;Step to pattern Number of Stairs: 3 General stair comments: Pt required increased time to ascend stairs and descend backward with PT providing VC's for lead LE and use of RW and rail to maintain WB status.  Pt reported increased pain during stair negotiation and required 3-4 rest breaks during the process.  Wheelchair Mobility    Modified Rankin (Stroke Patients Only)       Balance Overall balance assessment: Needs assistance Sitting-balance support: Single extremity supported       Standing balance support: Bilateral upper extremity supported                                 Pertinent Vitals/Pain Pain Assessment: 0-10 Pain Score: 3  Pain Location: L hip Pain Descriptors / Indicators: Aching Pain Intervention(s): Limited activity within  patient's tolerance    Home Living Family/patient expects to be discharged to:: Private residence Living Arrangements: Alone Available Help at Discharge: Family Type of Home: House Home Access: Stairs to enter Entrance Stairs-Rails: Right Entrance Stairs-Number of Steps: 9 in front, 4 in back [place]: One [place] - single point;Walker - 2 wheels;Grab bars - tub/shower      Prior Function Level of Independence: Independent with assistive device(s)         Comments: Pt had been using  a SPC     Hand Dominance        Extremity/Trunk Assessment   Upper Extremity Assessment Upper Extremity Assessment: Overall WFL for tasks assessed    Lower Extremity Assessment Lower Extremity Assessment: Overall WFL for tasks assessed    Cervical / Trunk Assessment Cervical / Trunk Assessment: Normal  Communication   Communication: No difficulties  Cognition Arousal/Alertness: Awake/alert Behavior During Therapy: WFL for tasks assessed/performed Overall Cognitive Status: Within Functional Limits for tasks assessed                                        General Comments      Exercises     Assessment/Plan    PT Assessment Patient needs continued PT services  PT Problem List Decreased mobility;Decreased activity tolerance;Decreased balance;Decreased knowledge of use of DME;Pain       PT Treatment Interventions DME instruction;Therapeutic activities;Gait training;Therapeutic exercise;Stair training;Balance training;Functional mobility training;Neuromuscular re-education;Patient/family education    PT Goals (Current goals can be found in the Care Plan section)  Acute Rehab PT Goals Patient Stated Goal: to return home eventually and be able to negotiate all of the stairs in his home. PT Goal Formulation: With patient Time For Goal Achievement: 05/26/17 Potential to Achieve Goals: Good    Frequency 7X/week   Barriers to discharge        Co-evaluation               AM-PAC PT "6 Clicks" Daily Activity  Outcome Measure Difficulty turning over in bed (including adjusting bedclothes, sheets and blankets)?: A Little Difficulty moving from lying on back to sitting on the side of the bed? : A Lot Difficulty sitting down on and standing up from a chair with arms (e.g., wheelchair, bedside commode, etc,.)?: A Lot Help needed moving to and from a bed to chair (including a wheelchair)?: A Lot Help needed walking in hospital room?: A Lot Help needed  climbing 3-5 steps with a railing? : A Lot 6 Click Score: 13    End of Session Equipment Utilized During Treatment: Gait belt Activity Tolerance: Patient limited by fatigue;Patient limited by pain Patient left: in chair;with call bell/phone within reach;with chair alarm set Nurse Communication: Mobility status;Weight bearing status PT Visit Diagnosis: Unsteadiness on feet (R26.81);History of falling (Z91.81);Pain Pain - Right/Left: Left Pain - part of body: Hip    Time: 0900-0930 PT Time Calculation (min) (ACUTE ONLY): 30 min   Charges:   PT Evaluation $PT Eval Low Complexity: 1 Low addendum 12:12 pm SBV PT Treatments $Gait Training: 8-22 mins   PT G Codes:   PT G-Codes **NOT FOR INPATIENT CLASS** Functional Assessment Tool Used: AM-PAC 6 Clicks Basic Mobility    Roxanne Gates, PT, DPT   Roxanne Gates 05/12/2017, 9:57 AM

## 2017-05-12 NOTE — Clinical Social Work Note (Addendum)
CSW spoke with patient and he is in agreement to going to SNF for short term rehab.  Patient states he has never been before, CSW explained to him what to expect and process of trying to find placement.  Patient gave CSW permission to begin bed search.  CSW informed insurance company to start authorization.  Formal assessment to follow.  Jones Broom. [place], MSW, [place]  05/12/2017 2:00 PM

## 2017-05-12 NOTE — Care Management (Signed)
Patient found to have  hairline hip fracture that extends to the pubic ramus. Physical therapy has recommended SNF and bed search has been initiated

## 2017-05-13 DIAGNOSIS — W19XXXD Unspecified fall, subsequent encounter: Secondary | ICD-10-CM | POA: Diagnosis not present

## 2017-05-13 DIAGNOSIS — M6281 Muscle weakness (generalized): Secondary | ICD-10-CM | POA: Diagnosis not present

## 2017-05-13 DIAGNOSIS — I10 Essential (primary) hypertension: Secondary | ICD-10-CM | POA: Diagnosis not present

## 2017-05-13 DIAGNOSIS — M1000 Idiopathic gout, unspecified site: Secondary | ICD-10-CM | POA: Diagnosis not present

## 2017-05-13 DIAGNOSIS — E785 Hyperlipidemia, unspecified: Secondary | ICD-10-CM | POA: Diagnosis not present

## 2017-05-13 DIAGNOSIS — R2681 Unsteadiness on feet: Secondary | ICD-10-CM | POA: Diagnosis not present

## 2017-05-13 DIAGNOSIS — E119 Type 2 diabetes mellitus without complications: Secondary | ICD-10-CM | POA: Diagnosis not present

## 2017-05-13 DIAGNOSIS — M84459A Pathological fracture, hip, unspecified, initial encounter for fracture: Secondary | ICD-10-CM | POA: Diagnosis not present

## 2017-05-13 DIAGNOSIS — M109 Gout, unspecified: Secondary | ICD-10-CM | POA: Diagnosis not present

## 2017-05-13 DIAGNOSIS — I429 Cardiomyopathy, unspecified: Secondary | ICD-10-CM | POA: Diagnosis not present

## 2017-05-13 DIAGNOSIS — D529 Folate deficiency anemia, unspecified: Secondary | ICD-10-CM | POA: Diagnosis not present

## 2017-05-13 DIAGNOSIS — N400 Enlarged prostate without lower urinary tract symptoms: Secondary | ICD-10-CM | POA: Diagnosis not present

## 2017-05-13 DIAGNOSIS — I482 Chronic atrial fibrillation: Secondary | ICD-10-CM | POA: Diagnosis not present

## 2017-05-13 DIAGNOSIS — Z7401 Bed confinement status: Secondary | ICD-10-CM | POA: Diagnosis not present

## 2017-05-13 DIAGNOSIS — K219 Gastro-esophageal reflux disease without esophagitis: Secondary | ICD-10-CM | POA: Diagnosis not present

## 2017-05-13 DIAGNOSIS — R55 Syncope and collapse: Secondary | ICD-10-CM | POA: Diagnosis not present

## 2017-05-13 DIAGNOSIS — E876 Hypokalemia: Secondary | ICD-10-CM | POA: Diagnosis not present

## 2017-05-13 DIAGNOSIS — S3289XD Fracture of other parts of pelvis, subsequent encounter for fracture with routine healing: Secondary | ICD-10-CM | POA: Diagnosis not present

## 2017-05-13 DIAGNOSIS — R600 Localized edema: Secondary | ICD-10-CM | POA: Diagnosis not present

## 2017-05-13 DIAGNOSIS — Z791 Long term (current) use of non-steroidal anti-inflammatories (NSAID): Secondary | ICD-10-CM | POA: Diagnosis not present

## 2017-05-13 DIAGNOSIS — G4730 Sleep apnea, unspecified: Secondary | ICD-10-CM | POA: Diagnosis not present

## 2017-05-13 DIAGNOSIS — E569 Vitamin deficiency, unspecified: Secondary | ICD-10-CM | POA: Diagnosis not present

## 2017-05-13 DIAGNOSIS — M25552 Pain in left hip: Secondary | ICD-10-CM | POA: Diagnosis not present

## 2017-05-13 DIAGNOSIS — Z781 Physical restraint status: Secondary | ICD-10-CM | POA: Diagnosis not present

## 2017-05-13 DIAGNOSIS — S32512D Fracture of superior rim of left pubis, subsequent encounter for fracture with routine healing: Secondary | ICD-10-CM | POA: Diagnosis not present

## 2017-05-13 DIAGNOSIS — I119 Hypertensive heart disease without heart failure: Secondary | ICD-10-CM | POA: Diagnosis not present

## 2017-05-13 DIAGNOSIS — I4891 Unspecified atrial fibrillation: Secondary | ICD-10-CM | POA: Diagnosis not present

## 2017-05-13 DIAGNOSIS — I480 Paroxysmal atrial fibrillation: Secondary | ICD-10-CM | POA: Diagnosis not present

## 2017-05-13 LAB — GLUCOSE, CAPILLARY
GLUCOSE-CAPILLARY: 116 mg/dL — AB (ref 65–99)
Glucose-Capillary: 103 mg/dL — ABNORMAL HIGH (ref 65–99)

## 2017-05-13 MED ORDER — OXYCODONE-ACETAMINOPHEN 5-325 MG PO TABS: 1.0000 | ORAL_TABLET | Freq: Four times a day (QID) | ORAL | 0 refills | Status: DC | PRN

## 2017-05-13 MED ORDER — ACETAMINOPHEN 325 MG PO TABS: 650.0000 mg | ORAL_TABLET | Freq: Four times a day (QID) | ORAL | Status: AC | PRN

## 2017-05-13 MED ORDER — DILTIAZEM HCL ER COATED BEADS 120 MG PO CP24
240.0000 mg | ORAL_CAPSULE | Freq: Every day | ORAL | Status: DC
  Administered 2017-05-13: 240 mg via ORAL
  Filled 2017-05-13: qty 2

## 2017-05-13 MED ORDER — METOPROLOL SUCCINATE ER 100 MG PO TB24
100.0000 mg | ORAL_TABLET | Freq: Every day | ORAL | Status: DC
  Administered 2017-05-13: 100 mg via ORAL
  Filled 2017-05-13: qty 1

## 2017-05-13 MED ORDER — DIGOXIN 125 MCG PO TABS
0.1250 mg | ORAL_TABLET | Freq: Every day | ORAL | Status: DC
  Administered 2017-05-13: 0.125 mg via ORAL
  Filled 2017-05-13: qty 1

## 2017-05-13 MED ORDER — DIGOXIN 125 MCG PO TABS: 125.0000 ug | ORAL_TABLET | Freq: Every day | ORAL | 1 refills | Status: DC

## 2017-05-13 NOTE — Progress Notes (Signed)
Pt is refusing the bed alarm. States "dont turn that thing back on". Education was done to educate him on why it's important to keep him safe.

## 2017-05-13 NOTE — Discharge Instructions (Signed)
[place] [facility] [place]:  Cardiac diet  DISCHARGE CONDITION:  Stable  ACTIVITY:  flat foot wt bearing status    OXYGEN:  Home Oxygen: No.   Oxygen Delivery: room air  DISCHARGE LOCATION:  nursing home    ADDITIONAL DISCHARGE INSTRUCTION:   If you experience worsening of your admission symptoms, develop shortness of breath, life threatening emergency, suicidal or homicidal thoughts you must seek medical attention immediately by calling 911 or calling your MD immediately  if symptoms less severe.  You Must read complete instructions/literature along with all the possible adverse reactions/side effects for all the Medicines you take and that have been prescribed to you. Take any new Medicines after you have completely understood and accpet all the possible adverse reactions/side effects.   Please note  You were cared for by a hospitalist during your hospital stay. If you have any questions about your discharge medications or the care you received while you were in the hospital after you are discharged, you can call the unit and asked to speak with the hospitalist on call if the hospitalist that took care of you is not available. Once you are discharged, your primary care physician will handle any further medical issues. Please note that NO REFILLS for any discharge medications will be authorized once you are discharged, as it is imperative that you return to your primary care physician (or establish a relationship with a primary care physician if you do not have one) for your aftercare needs so that they can reassess your need for medications and monitor your lab values.

## 2017-05-13 NOTE — Progress Notes (Signed)
EMS here to take pt to [facility]. VSS. No further concerns at this time.

## 2017-05-13 NOTE — Progress Notes (Signed)
[facility] Cardiology [facility] Encounter Note  Patient: Brendan Edwards / Admit Date: 05/10/2017 / Date of Encounter: 05/13/2017, 8:47 AM   Subjective: Patient with continued hip pain.  Rehabilitation and therapy have worked well with him initially.  The patient's heart rate has much better control with changes in medication management although with ambulation still somewhat rapid.  No evidence of chest discomfort or congestive heart failure  Review of Systems: Positive for: Hip pain Negative for: Vision change, hearing change, syncope, dizziness, nausea, vomiting,diarrhea, bloody stool, stomach pain, cough, congestion, diaphoresis, urinary frequency, urinary pain,skin lesions, skin rashes Others previously listed  Objective: Telemetry: Atrial fibrillation with controlled ventricular rate Physical Exam: Blood pressure (!) 141/89, pulse (!) 117, temperature 98.1 F (36.7 C), temperature source Oral, resp. rate 18, height 5\' 8"  (1.727 m), weight 206 lb 11.2 oz (93.8 kg), SpO2 90 %. Body mass index is 31.43 kg/m. General: Well developed, well nourished, in no acute distress. Head: Normocephalic, atraumatic, sclera non-icteric, no xanthomas, nares are without discharge. Neck: No apparent masses Lungs: Normal respirations with no wheezes, no rhonchi, no rales , no crackles   Heart: Irregular rate and rhythm, normal S1 S2, no murmur, no rub, no gallop, PMI is normal size and placement, carotid upstroke normal without bruit, jugular venous pressure normal Abdomen: Soft, non-tender, non-distended with normoactive bowel sounds. No hepatosplenomegaly. Abdominal aorta is normal size without bruit Extremities: Trace edema, no clubbing, no cyanosis, no ulcers,  Peripheral: 2+ radial, 2+ femoral, 2+ dorsal pedal pulses Neuro: Alert and oriented. Moves all extremities spontaneously. Psych:  Responds to questions appropriately with a normal affect.   Intake/Output Summary (Last 24 hours) at 05/13/2017  0847 Last data filed at 05/13/2017 0802 Gross per 24 hour  Intake 650 ml  Output 1300 ml  Net -650 ml    Inpatient Medications:  . amiodarone  400 mg Oral BID  . apixaban  5 mg Oral BID  . diltiazem  60 mg Oral Q8H  . furosemide  40 mg Oral Daily  . insulin aspart  0-5 Units Subcutaneous QHS  . insulin aspart  0-9 Units Subcutaneous TID WC  . ketorolac  30 mg Intravenous Q8H  . lisinopril  5 mg Oral Daily  . metoprolol succinate  100 mg Oral Daily  . potassium chloride  20 mEq Oral BID  . sodium chloride flush  3 mL Intravenous Q12H  . traZODone  50 mg Oral QHS   Infusions:   Labs: Recent Labs    05/11/17 0532 05/12/17 0415  NA 137 132*  K 3.2* 3.4*  CL 100* 97*  CO2 27 26  GLUCOSE 99 132*  BUN 13 24*  CREATININE 0.81 1.19  CALCIUM 8.4* 8.4*  MG 1.5*  --    No results for input(s): AST, ALT, ALKPHOS, BILITOT, PROT, ALBUMIN in the last 72 hours. Recent Labs    05/10/17 1532 05/11/17 0532  WBC 9.2 8.1  HGB 17.3 16.3  HCT 51.4 48.7  MCV 104.5* 105.3*  PLT 229 187   Recent Labs    05/10/17 1532  TROPONINI <0.03   Invalid input(s): POCBNP No results for input(s): HGBA1C in the last 72 hours.   Weights: Filed Weights   05/11/17 0113 05/12/17 0331 05/13/17 0500  Weight: 205 lb 12.8 oz (93.4 kg) 208 lb (94.3 kg) 206 lb 11.2 oz (93.8 kg)     Radiology/Studies:  Dg Chest 2 View  Result Date: 05/10/2017 CLINICAL DATA:  Tachycardia. History of atrial fibrillation and hypertension. EXAM:  CHEST  2 VIEW COMPARISON:  Radiographs 10/07/2016 and 08/11/2016. CT 08/10/2016. Thoracic MRI 10/09/2016. FINDINGS: The heart size and mediastinal contours are stable. There is mild aortic atherosclerosis. There is mild chronic scarring at both lung bases. No evidence of airspace disease, edema, significant pleural effusion or pneumothorax. Chronic compression deformities at T3 and T12, stable. Superior endplate compression fracture at L2 is new from MRI of 7 months ago.  IMPRESSION: No acute cardiopulmonary process. Interval L2 compression fracture since June 2018. Electronically Signed   By: Richardean Sale M.D.   On: 05/10/2017 16:11   Ct Hip Left Wo Contrast  Result Date: 05/11/2017 CLINICAL DATA:  The patient was having issues with unsteadiness and fell and hurt his left hip but no evidence of fracture. EXAM: CT OF THE LEFT HIP WITHOUT CONTRAST TECHNIQUE: Multidetector CT imaging of the left hip was performed according to the standard protocol. Multiplanar CT image reconstructions were also generated. COMPARISON:  None. FINDINGS: Bones/Joint/Cartilage Left total hip arthroplasty. Nondisplaced fracture of the anterior acetabulum adjacent to the acetabular cup component extending into the superior pubic ramus-acetabular junction. No other fracture or dislocation. Ligaments Ligaments are suboptimally evaluated by CT. Muscles and Tendons Muscles are normal. No muscle atrophy. Small amount of hemorrhagic fluid in the perivesicular space. Soft tissue No fluid collection or hematoma. No soft tissue mass. Peripheral vascular atherosclerotic disease. IMPRESSION: 1. Left total hip arthroplasty. Nondisplaced fracture of the anterior acetabulum adjacent to the acetabular cup component extending into the superior pubic ramus-acetabular junction. Electronically Signed   By: Kathreen Devoid   On: 05/11/2017 14:06   Dg Hip Unilat W Or Wo Pelvis 2-3 Views Left  Result Date: 05/10/2017 CLINICAL DATA:  Left hip pain.  Fall on Friday EXAM: DG HIP (WITH OR WITHOUT PELVIS) 2-3V LEFT COMPARISON:  01/22/2013 FINDINGS: Bilateral hip replacements noted. No acute bony abnormality. Specifically, no fracture, subluxation, or dislocation. IMPRESSION: No acute bony abnormality.  Bilateral hip replacements. Electronically Signed   By: Rolm Baptise M.D.   On: 05/10/2017 16:41     Assessment and Recommendation  72 y.o. male with chronic nonvalvular atrial fibrillation having fall and hip injury without  need for surgical intervention now having better heart rate control with additional medication management without evidence of heart failure or myocardial infarction. 1.  Continue diltiazem metoprolol combination for heart rate control of chronic nonvalvular atrial fibrillation.  For which most of the medications orally as an outpatient 2.  Amiodarone likely not to help a great deal due to atrial fibrillation being chronic and unlikely to convert to normal sinus rhythm due to atrial fibrillation being chronic 3.  Continue anticoagulation for further risk reduction and stroke with atrial fibrillation 4.  No further cardiac diagnostics necessary at this time 5.  Okay for ambulation from the cardiac standpoint and if ambulating well with no significant symptoms okay for discharge to home with follow-up next week for further adjustments of medication management Signed, Serafina Royals M.D. FACC

## 2017-05-13 NOTE — Clinical Social Work Note (Addendum)
Patient to be d/c'ed today to [facility].  Patient and family agreeable to plans will transport via ems RN to call report to 500 hall nurse, [phone] room [phone].  Evette Cristal, MSW, [place]

## 2017-05-13 NOTE — Progress Notes (Signed)
Subjective :Patient admitted for periprosthetic hip fracture non displaced Patient reports pain as mild.   no nausea and no vomiting Denies any sob or cp Sitting at edge of bed without discomfort   Objective: Vital signs in last 24 hours: Temp:  [97.8 F (36.6 C)-98.3 F (36.8 C)] 98.1 F (36.7 C) (01/31 0435) Pulse Rate:  [62-118] 117 (01/31 0729) Resp:  [18] 18 (01/31 0729) BP: (113-141)/(61-92) 141/89 (01/31 0729) SpO2:  [90 %-97 %] 90 % (01/31 0729) Weight:  [93.8 kg (206 lb 11.2 oz)] 93.8 kg (206 lb 11.2 oz) (01/31 0500) Good ROM to hip Minimal swelling  Intake/Output from previous day: 01/30 0701 - 01/31 0700 In: 650 [P.O.:600; IV Piggyback:50] Out: 800 [Urine:800] Intake/Output this shift: No intake/output data recorded.  Recent Labs    05/10/17 1532 05/11/17 0532  HGB 17.3 16.3   Recent Labs    05/10/17 1532 05/11/17 0532  WBC 9.2 8.1  RBC 4.92 4.63  HCT 51.4 48.7  PLT 229 187   Recent Labs    05/11/17 0532 05/12/17 0415  NA 137 132*  K 3.2* 3.4*  CL 100* 97*  CO2 27 26  BUN 13 24*  CREATININE 0.81 1.19  GLUCOSE 99 132*  CALCIUM 8.4* 8.4*   No results for input(s): LABPT, INR in the last 72 hours.  Neurologically intact Neurovascular intact Sensation intact distally Intact pulses distally Dorsiflexion/Plantar flexion intact Compartment soft  Assessment/Plan: F/u in [facility] in 3 weeks Continue flat foot wt bearing status Case management to assist with discharge planning Physical therapy today Bowel movement today Labs in am Plan to discharge when medically cleared   WOLFE,JON R. 05/13/2017, 7:48 AM

## 2017-05-13 NOTE — Clinical Social Work Note (Signed)
CSW received insurance authorization for patient to go to [facility] today, Josem Kaufmann number is [phone].  CSW notified [facility] and they can accept him today.  Jones Broom. [place], MSW, [place]  05/13/2017 11:25 AM

## 2017-05-13 NOTE — Discharge Summary (Signed)
[facility] at [facility], [place] y.o., DOB 1945-12-26, MRN [id number]. Admission date: 05/10/2017 Discharge Date 05/13/2017 Primary MD Tracie Harrier, MD Admitting Physician Lance Coon, MD  Admission Diagnosis  Left hip pain [M25.552] Atrial fibrillation with RVR ([place]) [I48.91] Fall, initial encounter [W19.XXXA]  Discharge Diagnosis   Principal Problem: Atrial fibrillation with rapid ventricular rate Left hip pain with Nondisplaced fracture of the anterior acetabulum adjacent to the  acetabular cup component extending into the superior pubic  ramus-acetabular junction Diabetes type 2 Essential hypertension Hypokalemia    Hospital Course  Brendan Edwards  is a 72 y.o. male who presents with fall at home and subsequent left hip pain.  He came to the ED for evaluation.  Left hip was without acute pathology on imaging, but he was found to be in A. fib with RVR.  Patient was admitted for further evaluation.  Patient initially required IV Cardizem drip.  Subsequently switched to oral.  Patient continued to have hip pain therefore had a CT scan which showed a hairline fracture of the left hip.  He was seen in consultation by orthopedics who recommended no surgical intervention.  Weightbearing flat foot. He is doing much better heart rate continues to be intermittently elevated.  Patient currently asymptomatic and stable for discharge.             Consults  cardiology, orthopedics  Significant Tests:  See full reports for all details    Dg Chest 2 View  Result Date: 05/10/2017 CLINICAL DATA:  Tachycardia. History of atrial fibrillation and hypertension. EXAM: CHEST  2 VIEW COMPARISON:  Radiographs 10/07/2016 and 08/11/2016. CT 08/10/2016. Thoracic MRI 10/09/2016. FINDINGS: The heart size and mediastinal contours are stable. There is mild aortic atherosclerosis. There is mild chronic scarring at both lung bases. No evidence of airspace disease,  edema, significant pleural effusion or pneumothorax. Chronic compression deformities at T3 and T12, stable. Superior endplate compression fracture at L2 is new from MRI of 7 months ago. IMPRESSION: No acute cardiopulmonary process. Interval L2 compression fracture since June 2018. Electronically Signed   By: Richardean Sale M.D.   On: 05/10/2017 16:11   Ct Hip Left Wo Contrast  Result Date: 05/11/2017 CLINICAL DATA:  The patient was having issues with unsteadiness and fell and hurt his left hip but no evidence of fracture. EXAM: CT OF THE LEFT HIP WITHOUT CONTRAST TECHNIQUE: Multidetector CT imaging of the left hip was performed according to the standard protocol. Multiplanar CT image reconstructions were also generated. COMPARISON:  None. FINDINGS: Bones/Joint/Cartilage Left total hip arthroplasty. Nondisplaced fracture of the anterior acetabulum adjacent to the acetabular cup component extending into the superior pubic ramus-acetabular junction. No other fracture or dislocation. Ligaments Ligaments are suboptimally evaluated by CT. Muscles and Tendons Muscles are normal. No muscle atrophy. Small amount of hemorrhagic fluid in the perivesicular space. Soft tissue No fluid collection or hematoma. No soft tissue mass. Peripheral vascular atherosclerotic disease. IMPRESSION: 1. Left total hip arthroplasty. Nondisplaced fracture of the anterior acetabulum adjacent to the acetabular cup component extending into the superior pubic ramus-acetabular junction. Electronically Signed   By: Kathreen Devoid   On: 05/11/2017 14:06   Dg Hip Unilat W Or Wo Pelvis 2-3 Views Left  Result Date: 05/10/2017 CLINICAL DATA:  Left hip pain.  Fall on Friday EXAM: DG HIP (WITH OR WITHOUT PELVIS) 2-3V LEFT COMPARISON:  01/22/2013 FINDINGS: Bilateral hip replacements noted. No acute bony abnormality. Specifically, no fracture, subluxation, or dislocation. IMPRESSION:  No acute bony abnormality.  Bilateral hip replacements. Electronically  Signed   By: Rolm Baptise M.D.   On: 05/10/2017 16:41       Today   Subjective:   Brendan Edwards patient feeling better  Objective:   Blood pressure (!) 141/89, pulse (!) 117, temperature 98.1 F (36.7 C), temperature source Oral, resp. rate 18, height 5\' 8"  (1.727 m), weight 206 lb 11.2 oz (93.8 kg), SpO2 90 %.  .  Intake/Output Summary (Last 24 hours) at 05/13/2017 0908 Last data filed at 05/13/2017 0802 Gross per 24 hour  Intake 650 ml  Output 1300 ml  Net -650 ml    Exam VITAL SIGNS: Blood pressure (!) 141/89, pulse (!) 117, temperature 98.1 F (36.7 C), temperature source Oral, resp. rate 18, height 5\' 8"  (1.727 m), weight 206 lb 11.2 oz (93.8 kg), SpO2 90 %.  GENERAL:  72 y.o.-year-old patient lying in the bed with no acute distress.  EYES: Pupils equal, round, reactive to light and accommodation. No scleral icterus. Extraocular muscles intact.  HEENT: Head atraumatic, normocephalic. Oropharynx and nasopharynx clear.  NECK:  Supple, no jugular venous distention. No thyroid enlargement, no tenderness.  LUNGS: Normal breath sounds bilaterally, no wheezing, rales,rhonchi or crepitation. No use of accessory muscles of respiration.  CARDIOVASCULAR irregularly irregular no murmurs, rubs, or gallops.  ABDOMEN: Soft, nontender, nondistended. Bowel sounds present. No organomegaly or mass.  EXTREMITIES: No pedal edema, cyanosis, or clubbing.  NEUROLOGIC: Cranial nerves II through XII are intact. Muscle strength 5/5 in all extremities. Sensation intact. Gait not checked.  PSYCHIATRIC: The patient is alert and oriented x 3.  SKIN: No obvious rash, lesion, or ulcer.   Data Review     CBC w Diff:  Lab Results  Component Value Date   WBC 8.1 05/11/2017   HGB 16.3 05/11/2017   HGB 16.8 02/09/2012   HCT 48.7 05/11/2017   HCT 49.4 02/09/2012   PLT 187 05/11/2017   PLT 169 02/09/2012   LYMPHOPCT 4 08/10/2016   MONOPCT 7 08/10/2016   EOSPCT 0 08/10/2016   BASOPCT 1 08/10/2016    CMP:  Lab Results  Component Value Date   NA 132 (L) 05/12/2017   NA 141 02/09/2012   K 3.4 (L) 05/12/2017   K 3.4 (L) 02/09/2012   CL 97 (L) 05/12/2017   CL 103 02/09/2012   CO2 26 05/12/2017   CO2 28 02/09/2012   BUN 24 (H) 05/12/2017   BUN 9 02/09/2012   CREATININE 1.19 05/12/2017   CREATININE 0.83 02/09/2012   PROT 7.7 10/07/2016   ALBUMIN 3.9 10/07/2016   BILITOT 0.7 10/07/2016   ALKPHOS 74 10/07/2016   AST 32 10/07/2016   ALT 20 10/07/2016  .  Micro Results No results found for this or any previous visit (from the past 240 hour(s)).      Code Status Orders  (From admission, onward)        Start     Ordered   05/11/17 0120  Full code  Continuous     05/11/17 0119    Code Status History    Date Active Date Inactive Code Status Order ID Comments User Context   10/07/2016 16:40 10/12/2016 19:09 Full Code [id number]  Vaughan Basta, MD ED   08/10/2016 20:17 08/20/2016 15:17 Full Code [id number]  Theodoro Grist, MD ED           Contact information for follow-up providers    Hooten, Laurice Record, MD Follow up in 3 week(s).  Specialty:  Orthopedic Surgery Why:  hosp f/u Contact information: [address] [facility] [place] [address] [phone]            Contact information for after-discharge care    Destination    [facility] Follow up.   Service:  Skilled Nursing Contact information: [address] [place] [phone]                  Discharge Medications   Allergies as of 05/13/2017      Reactions   Oxycodone Other (See Comments)   Hallucinations   Cardura [doxazosin Mesylate]    Darvon [propoxyphene]    Penicillins    Has patient had a PCN reaction causing immediate rash, facial/tongue/throat swelling, SOB or lightheadedness with hypotension: Yes Has patient had a PCN reaction causing severe rash involving mucus membranes or skin necrosis: No Has patient had  a PCN reaction that required hospitalization: No Has patient had a PCN reaction occurring within the last 10 years: No If all of the above answers are "NO", then may proceed with Cephalosporin use.   Requip [ropinirole Hcl]    Septra [sulfamethoxazole-trimethoprim]       Medication List    TAKE these medications   acetaminophen 325 MG tablet Commonly known as:  TYLENOL Take 2 tablets (650 mg total) by mouth every 6 (six) hours as needed for mild pain (or Fever >/= 101).   allopurinol 300 MG tablet Commonly known as:  ZYLOPRIM Take 300 mg by mouth daily.   amiodarone 400 MG tablet Commonly known as:  PACERONE Take 1 tablet (400 mg total) by mouth daily. What changed:  when to take this   apixaban 5 MG Tabs tablet Commonly known as:  ELIQUIS Take 5 mg by mouth 2 (two) times daily.   benzocaine 10 % mucosal gel Commonly known as:  ORAJEL Use as directed in the mouth or throat 4 (four) times daily as needed for mouth pain.   digoxin 0.125 MG tablet Commonly known as:  LANOXIN Take 1 tablet (125 mcg total) by mouth daily.   diltiazem 360 MG 24 hr capsule Commonly known as:  CARDIZEM CD Take 1 capsule (360 mg total) by mouth daily.   folic acid 1 MG tablet Commonly known as:  FOLVITE Take 1 tablet (1 mg total) by mouth daily.   furosemide 40 MG tablet Commonly known as:  LASIX Take 40 mg by mouth daily.   lisinopril 5 MG tablet Commonly known as:  PRINIVIL,ZESTRIL Take 5 mg by mouth daily.   metoprolol succinate 50 MG 24 hr tablet Commonly known as:  TOPROL-XL Take 1 tablet by mouth daily.   oxyCODONE-acetaminophen 5-325 MG tablet Commonly known as:  PERCOCET/ROXICET Take 1 tablet by mouth every 6 (six) hours as needed for moderate pain.   potassium chloride SA 20 MEQ tablet Commonly known as:  K-DUR,KLOR-CON Take 20 mEq by mouth 2 (two) times daily.   thiamine 50 MG tablet Take 1 tablet (50 mg total) by mouth daily.   traMADol 50 MG tablet Commonly known  as:  ULTRAM Take 50 mg by mouth at bedtime.          Total Time in preparing paper work, data evaluation and todays exam - 65 minutes  Dustin Flock M.D on 05/13/2017 at 9:08 AM  [facility]   Office  [phone]

## 2017-05-13 NOTE — Progress Notes (Addendum)
Report called to [facility]. EMS to take. EMS called.

## 2017-05-13 NOTE — Clinical Social Work Placement (Signed)
   CLINICAL SOCIAL WORK PLACEMENT  NOTE  Date:  05/13/2017  Patient Details  Name: Brendan Edwards MRN: [id number] Date of Birth: [birth year]  Clinical Social Work is seeking post-discharge placement for this patient at the [place] level of care (*CSW will initial, date and re-position this form in  chart as items are completed):  Yes   Patient/family provided with [facility] Work Department's list of facilities offering this level of care within the geographic area requested by the patient (or if unable, by the patient's family).  Yes   Patient/family informed of their freedom to choose among providers that offer the needed level of care, that participate in Medicare, Medicaid or managed care program needed by the patient, have an available bed and are willing to accept the patient.  Yes   Patient/family informed of [facility]'s ownership interest in [facility] and [facility], as well as of the fact that they are under no obligation to receive care at these facilities.  PASRR submitted to EDS on 05/12/17     PASRR number received on 05/12/17     Existing PASRR number confirmed on       FL2 transmitted to all facilities in geographic area requested by pt/family on 05/12/17     FL2 transmitted to all facilities within larger geographic area on       Patient informed that his/her managed care company has contracts with or will negotiate with certain facilities, including the following:        Yes   Patient/family informed of bed offers received.  Patient chooses bed at [facility]     Physician recommends and patient chooses bed at      Patient to be transferred to [facility] on 05/13/17.  Patient to be transferred to facility by [facility]     Patient family notified on 05/13/17 of transfer.  Name of family member notified:  Patient did not want CSW to notify his family, patient will cotact them him  self.     PHYSICIAN Please sign FL2     Additional Comment:    _______________________________________________ Ross Ludwig, LCSWA 05/13/2017, 11:27 AM

## 2017-05-15 DIAGNOSIS — I4891 Unspecified atrial fibrillation: Secondary | ICD-10-CM | POA: Diagnosis not present

## 2017-05-15 DIAGNOSIS — M109 Gout, unspecified: Secondary | ICD-10-CM | POA: Diagnosis not present

## 2017-05-15 DIAGNOSIS — I10 Essential (primary) hypertension: Secondary | ICD-10-CM | POA: Diagnosis not present

## 2017-05-15 DIAGNOSIS — Z791 Long term (current) use of non-steroidal anti-inflammatories (NSAID): Secondary | ICD-10-CM | POA: Diagnosis not present

## 2017-05-15 DIAGNOSIS — S32512D Fracture of superior rim of left pubis, subsequent encounter for fracture with routine healing: Secondary | ICD-10-CM | POA: Diagnosis not present

## 2017-05-21 DIAGNOSIS — S32512D Fracture of superior rim of left pubis, subsequent encounter for fracture with routine healing: Secondary | ICD-10-CM | POA: Diagnosis not present

## 2017-05-21 DIAGNOSIS — I4891 Unspecified atrial fibrillation: Secondary | ICD-10-CM | POA: Diagnosis not present

## 2017-05-21 DIAGNOSIS — Z791 Long term (current) use of non-steroidal anti-inflammatories (NSAID): Secondary | ICD-10-CM | POA: Diagnosis not present

## 2017-05-21 DIAGNOSIS — R600 Localized edema: Secondary | ICD-10-CM | POA: Diagnosis not present

## 2017-05-21 DIAGNOSIS — I10 Essential (primary) hypertension: Secondary | ICD-10-CM | POA: Diagnosis not present

## 2017-05-21 DIAGNOSIS — M109 Gout, unspecified: Secondary | ICD-10-CM | POA: Diagnosis not present

## 2017-05-29 DIAGNOSIS — Z7901 Long term (current) use of anticoagulants: Secondary | ICD-10-CM | POA: Diagnosis not present

## 2017-05-29 DIAGNOSIS — K219 Gastro-esophageal reflux disease without esophagitis: Secondary | ICD-10-CM | POA: Diagnosis not present

## 2017-05-29 DIAGNOSIS — Z9181 History of falling: Secondary | ICD-10-CM | POA: Diagnosis not present

## 2017-05-29 DIAGNOSIS — M069 Rheumatoid arthritis, unspecified: Secondary | ICD-10-CM | POA: Diagnosis not present

## 2017-05-29 DIAGNOSIS — D529 Folate deficiency anemia, unspecified: Secondary | ICD-10-CM | POA: Diagnosis not present

## 2017-05-29 DIAGNOSIS — S32402D Unspecified fracture of left acetabulum, subsequent encounter for fracture with routine healing: Secondary | ICD-10-CM | POA: Diagnosis not present

## 2017-05-29 DIAGNOSIS — F17210 Nicotine dependence, cigarettes, uncomplicated: Secondary | ICD-10-CM | POA: Diagnosis not present

## 2017-05-29 DIAGNOSIS — I4891 Unspecified atrial fibrillation: Secondary | ICD-10-CM | POA: Diagnosis not present

## 2017-05-29 DIAGNOSIS — Z96641 Presence of right artificial hip joint: Secondary | ICD-10-CM | POA: Diagnosis not present

## 2017-05-29 DIAGNOSIS — I10 Essential (primary) hypertension: Secondary | ICD-10-CM | POA: Diagnosis not present

## 2017-05-29 DIAGNOSIS — G4730 Sleep apnea, unspecified: Secondary | ICD-10-CM | POA: Diagnosis not present

## 2017-05-29 DIAGNOSIS — Z79891 Long term (current) use of opiate analgesic: Secondary | ICD-10-CM | POA: Diagnosis not present

## 2017-05-29 DIAGNOSIS — N400 Enlarged prostate without lower urinary tract symptoms: Secondary | ICD-10-CM | POA: Diagnosis not present

## 2017-05-29 DIAGNOSIS — E669 Obesity, unspecified: Secondary | ICD-10-CM | POA: Diagnosis not present

## 2017-05-29 DIAGNOSIS — E119 Type 2 diabetes mellitus without complications: Secondary | ICD-10-CM | POA: Diagnosis not present

## 2017-05-31 ENCOUNTER — Other Ambulatory Visit: Payer: Self-pay

## 2017-05-31 NOTE — Patient Outreach (Signed)
[facility]) Care Management  05/31/2017  JARRET TORRE 04-04-1946 [id number]  Transition of care  Referral date: 05/31/17 Referral source: discharged from [place] 05/26/17 Insurance: Health team advantage Attempt #1  Telephone call to patient regarding transition of care referral.  Unable to reach patient. HIPAA compliant voice message left with call back phone number.  PLAN; RNCM will attempt 2nd telephone call to patient within 3 business days.   Quinn Plowman RN,BSN,CCM [facility] Telephonic  [phone]

## 2017-06-01 ENCOUNTER — Other Ambulatory Visit: Payer: Self-pay

## 2017-06-01 NOTE — Patient Outreach (Signed)
[facility]) Care Management  06/01/2017  Brendan Edwards [birth year] [id number]  Brendan Edwards 01-Feb-1946 [id number]  Transition of care  Referral date: 05/31/17 Referral source: discharged from [place] 05/26/17 Insurance: Health team advantage Attempt #2  Telephone call to patient regarding transition of care referral.   Attempted listed home and mobile number. Unable to reach patient. HIPAA compliant voice message left with call back phone number.  PLAN; RNCM will send patient outreach letter to attempt contact.    Quinn Plowman RN,BSN,CCM [facility] Telephonic  [phone]

## 2017-06-11 DIAGNOSIS — M25552 Pain in left hip: Secondary | ICD-10-CM | POA: Diagnosis not present

## 2017-06-11 DIAGNOSIS — S32435D Nondisplaced fracture of anterior column [iliopubic] of left acetabulum, subsequent encounter for fracture with routine healing: Secondary | ICD-10-CM | POA: Diagnosis not present

## 2017-06-13 DIAGNOSIS — I429 Cardiomyopathy, unspecified: Secondary | ICD-10-CM | POA: Diagnosis not present

## 2017-06-13 DIAGNOSIS — I4891 Unspecified atrial fibrillation: Secondary | ICD-10-CM | POA: Diagnosis not present

## 2017-06-13 DIAGNOSIS — I119 Hypertensive heart disease without heart failure: Secondary | ICD-10-CM | POA: Diagnosis not present

## 2017-06-15 ENCOUNTER — Other Ambulatory Visit: Payer: Self-pay

## 2017-06-15 NOTE — Patient Outreach (Signed)
[facility]) Care Management  06/15/2017  GEROLD SAR [birth year] [id number]   TYRELL SEIFER 05-13-1945 [id number]  Transition of care  Referral date:05/31/17 Referral source:discharged from [facility] 05/26/17 Insurance:Health team advantage Attempt #3  Telephone call to patient regardingtransition of care referral. Attempted listed home and mobile number. Unable to reach patient. HIPAA compliant voice message left with call back phone number.  PLAN; RNCM will refer patient to care management assistant to close patient due to being unable to reach.  RNCM will send notification to primary MD of closure.   Quinn Plowman RN,BSN,CCM [facility] Telephonic  [phone]

## 2017-06-21 IMAGING — CT CT MAXILLOFACIAL W/O CM
4 of 6 series · 16 of 47 positions shown, 18 images · non-contrast
Comparison: None.

CLINICAL DATA: Fall last night.  Facial swelling.  Left jaw pain.

EXAM:
CT HEAD WITHOUT CONTRAST
CT MAXILLOFACIAL WITHOUT CONTRAST
TECHNIQUE: Multidetector CT imaging of the head and maxillofacial structures
were performed using the standard protocol without intravenous
contrast. Multiplanar CT image reconstructions of the maxillofacial
structures were also generated.

[Series 2: head wo · axial · 0.43mm/px · z∈[-139,-19]mm · 7 of 33 slices shown, 9 images]
[im 5/33  brain]
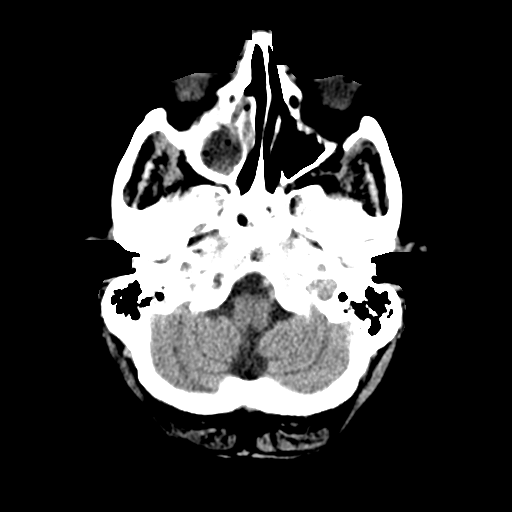
[im 5/33  bone]
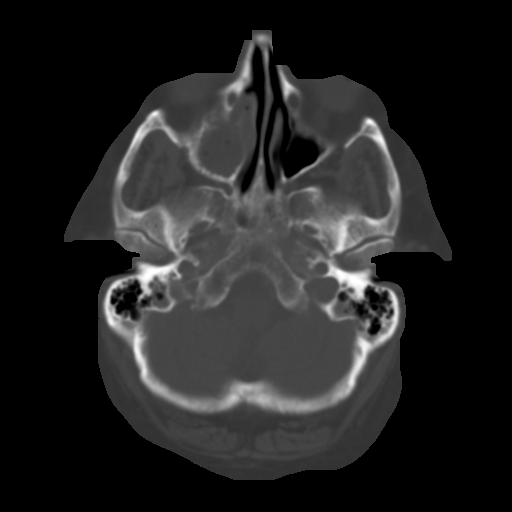
[im 9/33  bone]
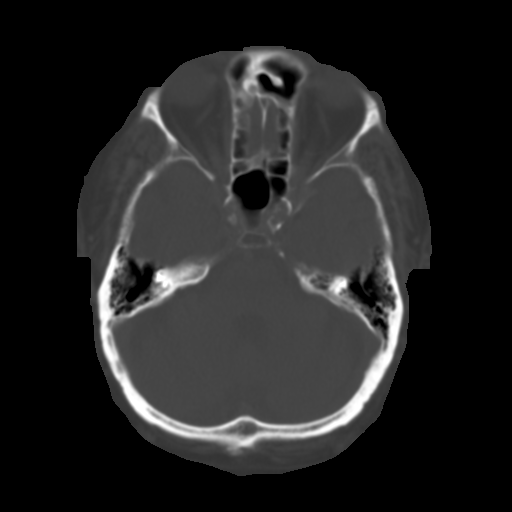
[im 13/33  bone]
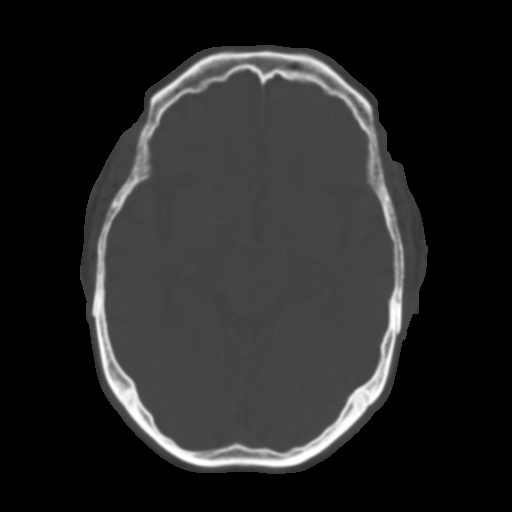
[im 17/33  bone]
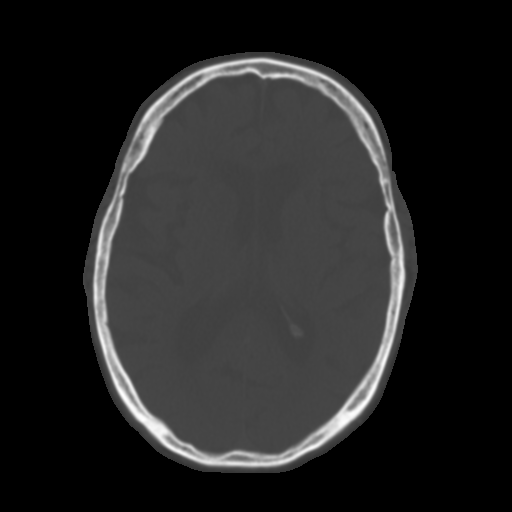
[im 21/33  brain]
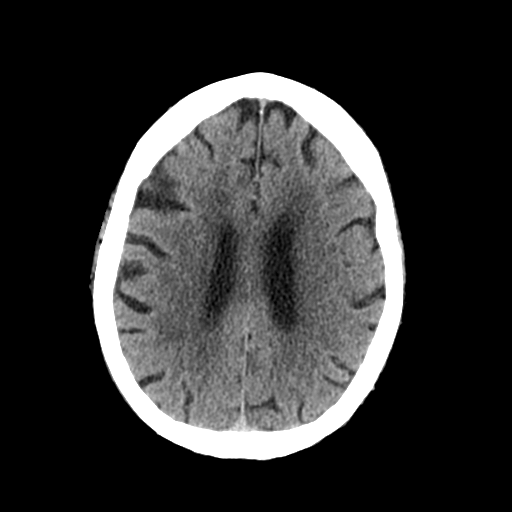
[im 21/33  bone]
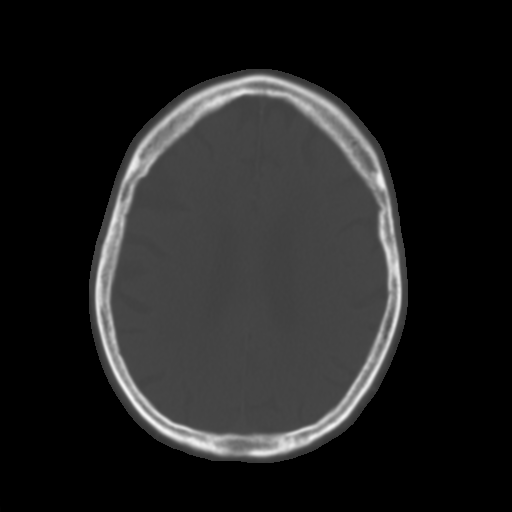
[im 25/33  bone]
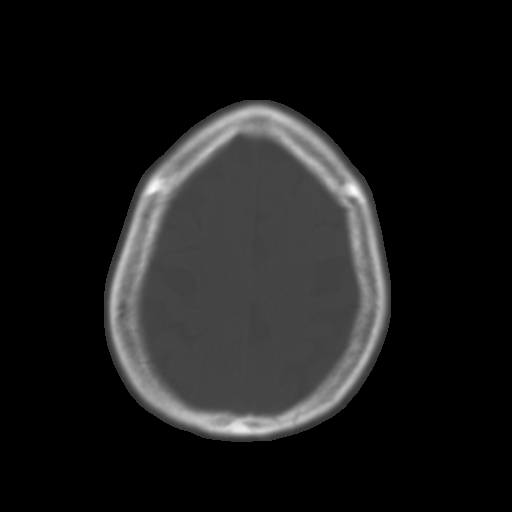
[im 29/33  bone]
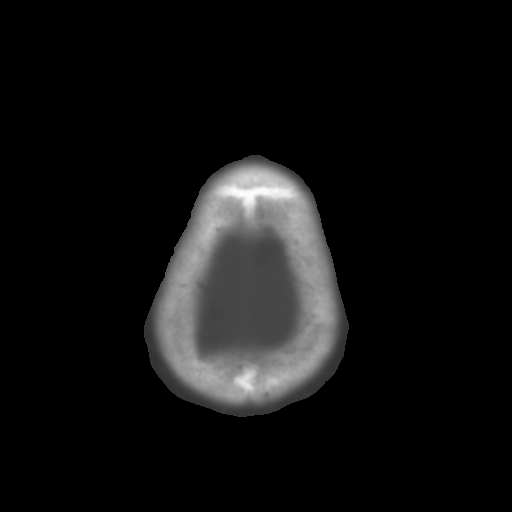

[Series 6: max soft · axial · 0.39mm/px · z∈[-236,-204]mm · 3 of 83 slices shown]
[im 9/83  brain]
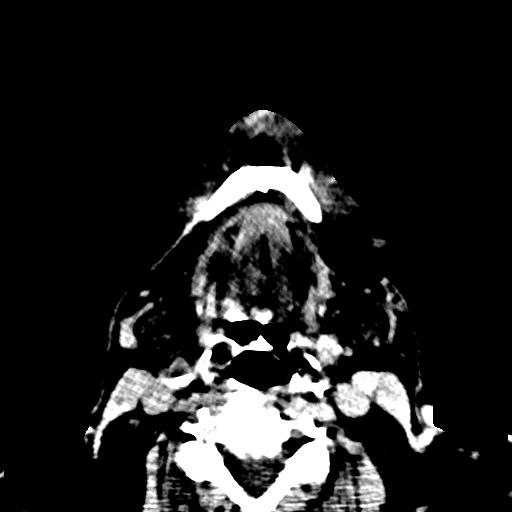
[im 17/83  brain]
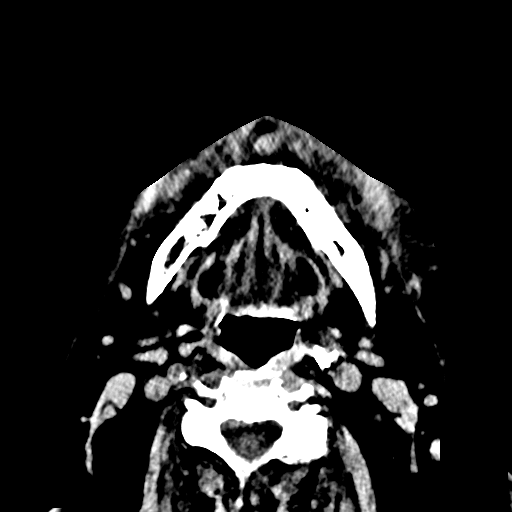
[im 25/83  brain]
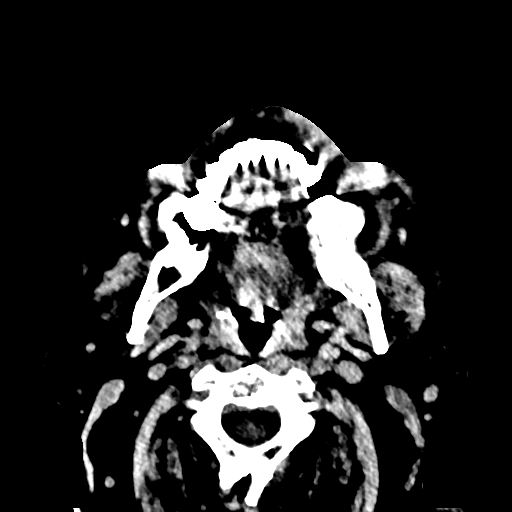

[Series 10: coronal soft · coronal · 0.38mm/px · 3 of 89 slices shown]
[im 18/89  bone]
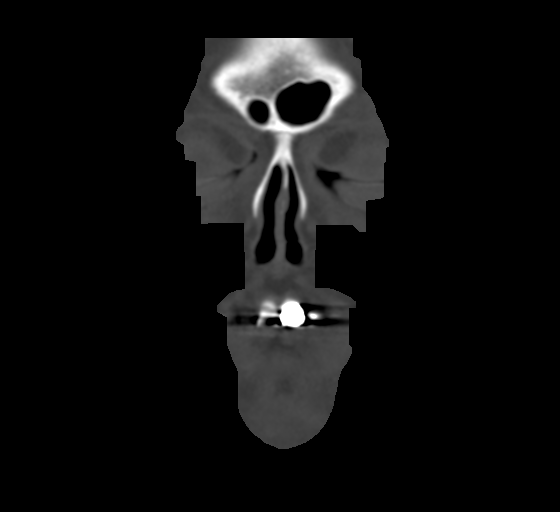
[im 36/89  bone]
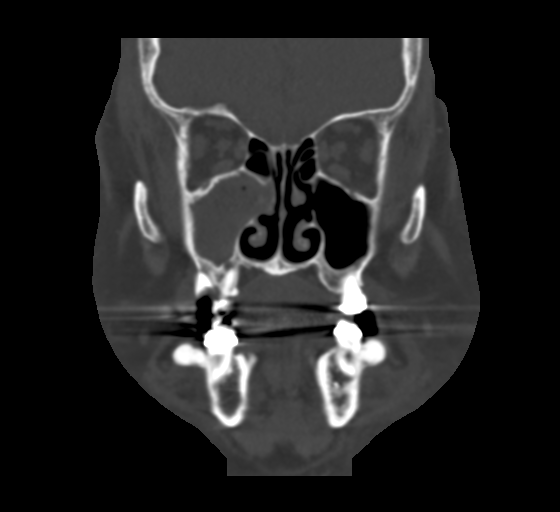
[im 53/89  bone]
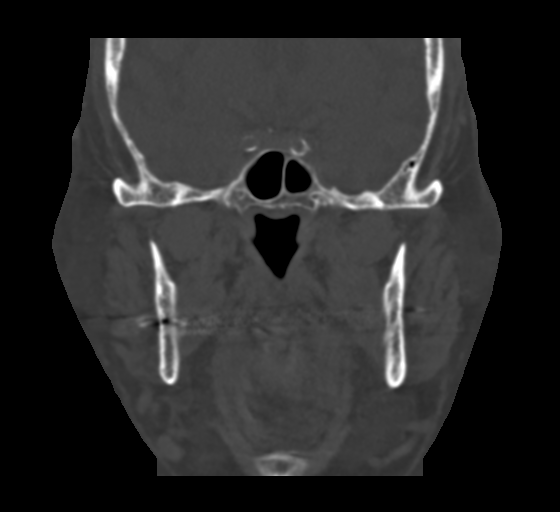

[Series 11: sagittal soft · sagittal · 0.39mm/px · 3 of 94 slices shown]
[im 24/94  bone]
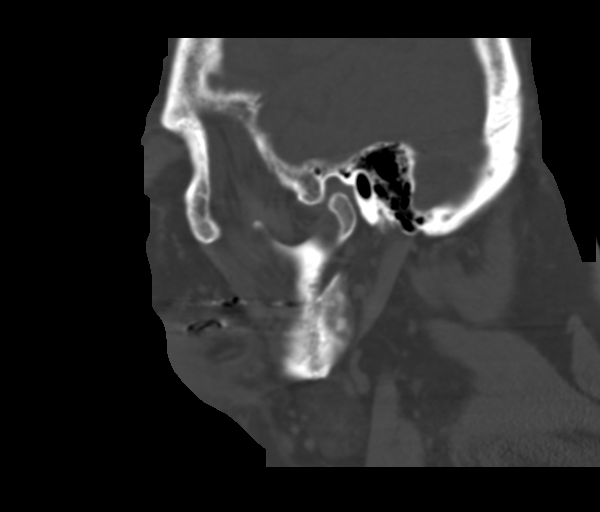
[im 47/94  bone]
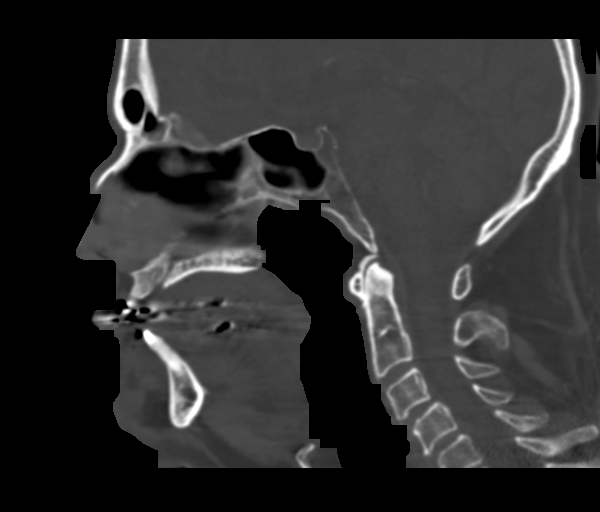
[im 70/94  bone]
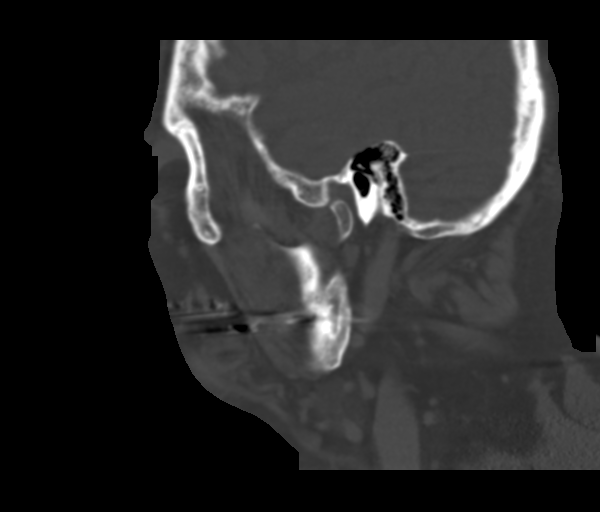

[16 of 47 positions shown; findings below may reference images not displayed]

FINDINGS: CT HEAD FINDINGS

Brain: There is atrophy and chronic small vessel disease changes. No
acute intracranial abnormality. Specifically, no hemorrhage,
hydrocephalus, mass lesion, acute infarction, or significant
intracranial injury.

Vascular: No hyperdense vessel or unexpected calcification.

Skull: No acute calvarial abnormality.

Other: None

CT MAXILLOFACIAL FINDINGS

Osseous: No evidence of facial fracture. Zygomatic arches and
mandible are intact.

Orbits: No evidence of orbital fracture. Orbital soft tissues
unremarkable.

Sinuses: Complete opacification of the right maxillary sinus.
Mucosal thickening and opacified scattered right ethmoid air cells
and mucosal thickening in the right frontal sinus. Mastoid air cells
are clear.

Soft tissues: Mild soft tissue swelling over the lower face, left
greater than right.
IMPRESSION: No acute intracranial abnormality. Atrophy, chronic small vessel
disease.

No evidence of orbital or facial fracture.

Right paranasal sinusitis.

## 2017-07-28 ENCOUNTER — Emergency Department: Payer: PPO

## 2017-07-28 ENCOUNTER — Other Ambulatory Visit: Payer: Self-pay

## 2017-07-28 ENCOUNTER — Inpatient Hospital Stay
Admission: EM | Admit: 2017-07-28 | Discharge: 2017-07-29 | DRG: 308 | Disposition: A | Discharge status: Home / Self Care | Payer: PPO | Attending: Internal Medicine | Admitting: Internal Medicine

## 2017-07-28 DIAGNOSIS — M109 Gout, unspecified: Secondary | ICD-10-CM | POA: Diagnosis present

## 2017-07-28 DIAGNOSIS — I482 Chronic atrial fibrillation: Principal | ICD-10-CM | POA: Diagnosis present

## 2017-07-28 DIAGNOSIS — Z882 Allergy status to sulfonamides status: Secondary | ICD-10-CM | POA: Diagnosis not present

## 2017-07-28 DIAGNOSIS — Z6832 Body mass index (BMI) 32.0-32.9, adult: Secondary | ICD-10-CM | POA: Diagnosis not present

## 2017-07-28 DIAGNOSIS — I509 Heart failure, unspecified: Secondary | ICD-10-CM

## 2017-07-28 DIAGNOSIS — Z9049 Acquired absence of other specified parts of digestive tract: Secondary | ICD-10-CM | POA: Diagnosis not present

## 2017-07-28 DIAGNOSIS — F17290 Nicotine dependence, other tobacco product, uncomplicated: Secondary | ICD-10-CM | POA: Diagnosis not present

## 2017-07-28 DIAGNOSIS — Z885 Allergy status to narcotic agent status: Secondary | ICD-10-CM

## 2017-07-28 DIAGNOSIS — I248 Other forms of acute ischemic heart disease: Secondary | ICD-10-CM | POA: Diagnosis present

## 2017-07-28 DIAGNOSIS — I4891 Unspecified atrial fibrillation: Secondary | ICD-10-CM

## 2017-07-28 DIAGNOSIS — Z716 Tobacco abuse counseling: Secondary | ICD-10-CM

## 2017-07-28 DIAGNOSIS — I5023 Acute on chronic systolic (congestive) heart failure: Secondary | ICD-10-CM | POA: Diagnosis not present

## 2017-07-28 DIAGNOSIS — Z880 Allergy status to penicillin: Secondary | ICD-10-CM | POA: Diagnosis not present

## 2017-07-28 DIAGNOSIS — E669 Obesity, unspecified: Secondary | ICD-10-CM | POA: Diagnosis present

## 2017-07-28 DIAGNOSIS — R0602 Shortness of breath: Secondary | ICD-10-CM | POA: Diagnosis not present

## 2017-07-28 DIAGNOSIS — G4730 Sleep apnea, unspecified: Secondary | ICD-10-CM | POA: Diagnosis present

## 2017-07-28 DIAGNOSIS — E119 Type 2 diabetes mellitus without complications: Secondary | ICD-10-CM | POA: Diagnosis present

## 2017-07-28 DIAGNOSIS — Z8249 Family history of ischemic heart disease and other diseases of the circulatory system: Secondary | ICD-10-CM

## 2017-07-28 DIAGNOSIS — I5022 Chronic systolic (congestive) heart failure: Secondary | ICD-10-CM | POA: Diagnosis not present

## 2017-07-28 DIAGNOSIS — I5033 Acute on chronic diastolic (congestive) heart failure: Secondary | ICD-10-CM | POA: Diagnosis present

## 2017-07-28 DIAGNOSIS — Z7901 Long term (current) use of anticoagulants: Secondary | ICD-10-CM

## 2017-07-28 DIAGNOSIS — R0902 Hypoxemia: Secondary | ICD-10-CM | POA: Diagnosis not present

## 2017-07-28 DIAGNOSIS — K219 Gastro-esophageal reflux disease without esophagitis: Secondary | ICD-10-CM | POA: Diagnosis not present

## 2017-07-28 DIAGNOSIS — I110 Hypertensive heart disease with heart failure: Secondary | ICD-10-CM | POA: Diagnosis present

## 2017-07-28 DIAGNOSIS — Z79899 Other long term (current) drug therapy: Secondary | ICD-10-CM

## 2017-07-28 DIAGNOSIS — E785 Hyperlipidemia, unspecified: Secondary | ICD-10-CM | POA: Diagnosis not present

## 2017-07-28 DIAGNOSIS — I10 Essential (primary) hypertension: Secondary | ICD-10-CM | POA: Diagnosis not present

## 2017-07-28 DIAGNOSIS — Z96643 Presence of artificial hip joint, bilateral: Secondary | ICD-10-CM | POA: Diagnosis present

## 2017-07-28 DIAGNOSIS — Z79891 Long term (current) use of opiate analgesic: Secondary | ICD-10-CM

## 2017-07-28 DIAGNOSIS — R000 Tachycardia, unspecified: Secondary | ICD-10-CM | POA: Diagnosis present

## 2017-07-28 DIAGNOSIS — Z888 Allergy status to other drugs, medicaments and biological substances status: Secondary | ICD-10-CM

## 2017-07-28 DIAGNOSIS — E782 Mixed hyperlipidemia: Secondary | ICD-10-CM | POA: Diagnosis not present

## 2017-07-28 LAB — TROPONIN I
TROPONIN I: 0.07 ng/mL — AB (ref <0.03)
TROPONIN I: 0.08 ng/mL — AB (ref <0.03)

## 2017-07-28 LAB — BASIC METABOLIC PANEL
ANION GAP: 7 (ref 5–15)
BUN: 14 mg/dL (ref 6–20)
CALCIUM: 8.5 mg/dL — AB (ref 8.9–10.3)
CO2: 26 mmol/L (ref 22–32)
CREATININE: 0.99 mg/dL (ref 0.61–1.24)
Chloride: 103 mmol/L (ref 101–111)
Glucose, Bld: 113 mg/dL — ABNORMAL HIGH (ref 65–99)
Potassium: 4.3 mmol/L (ref 3.5–5.1)
SODIUM: 136 mmol/L (ref 135–145)

## 2017-07-28 LAB — CBC
HCT: 49.1 % (ref 40.0–52.0)
HEMOGLOBIN: 16.4 g/dL (ref 13.0–18.0)
MCH: 34.9 pg — ABNORMAL HIGH (ref 26.0–34.0)
MCHC: 33.5 g/dL (ref 32.0–36.0)
MCV: 104.3 fL — ABNORMAL HIGH (ref 80.0–100.0)
PLATELETS: 207 10*3/uL (ref 150–440)
RBC: 4.71 MIL/uL (ref 4.40–5.90)
RDW: 16.6 % — ABNORMAL HIGH (ref 11.5–14.5)
WBC: 7.3 10*3/uL (ref 3.8–10.6)

## 2017-07-28 LAB — BRAIN NATRIURETIC PEPTIDE: B Natriuretic Peptide: 1009 pg/mL — ABNORMAL HIGH (ref 0.0–100.0)

## 2017-07-28 LAB — DIGOXIN LEVEL

## 2017-07-28 MED ORDER — TRAMADOL HCL 50 MG PO TABS
50.0000 mg | ORAL_TABLET | Freq: Every day | ORAL | Status: DC
Start: 2017-07-28 — End: 2017-07-29
  Administered 2017-07-28: 50 mg via ORAL
  Filled 2017-07-28: qty 1

## 2017-07-28 MED ORDER — ONDANSETRON HCL 4 MG/2ML IJ SOLN: 4.0000 mg | Freq: Four times a day (QID) | INTRAMUSCULAR | Status: DC | PRN

## 2017-07-28 MED ORDER — OXYCODONE-ACETAMINOPHEN 5-325 MG PO TABS: 1.0000 | ORAL_TABLET | Freq: Four times a day (QID) | ORAL | Status: DC | PRN

## 2017-07-28 MED ORDER — ALLOPURINOL 300 MG PO TABS
300.0000 mg | ORAL_TABLET | Freq: Every day | ORAL | Status: DC
  Administered 2017-07-28 – 2017-07-29 (×2): 300 mg via ORAL
  Filled 2017-07-28 (×2): qty 1

## 2017-07-28 MED ORDER — VITAMIN B-1 100 MG PO TABS
50.0000 mg | ORAL_TABLET | Freq: Every day | ORAL | Status: DC
  Administered 2017-07-28 – 2017-07-29 (×2): 50 mg via ORAL
  Filled 2017-07-28 (×2): qty 1

## 2017-07-28 MED ORDER — INSULIN ASPART 100 UNIT/ML ~~LOC~~ SOLN: 0.0000 [IU] | Freq: Every day | SUBCUTANEOUS | Status: DC

## 2017-07-28 MED ORDER — INSULIN ASPART 100 UNIT/ML ~~LOC~~ SOLN: 0.0000 [IU] | Freq: Three times a day (TID) | SUBCUTANEOUS | Status: DC

## 2017-07-28 MED ORDER — DILTIAZEM HCL 25 MG/5ML IV SOLN: 30.0000 mg | Freq: Once | INTRAVENOUS | Status: DC

## 2017-07-28 MED ORDER — DIGOXIN 125 MCG PO TABS
125.0000 ug | ORAL_TABLET | Freq: Every day | ORAL | Status: DC
  Administered 2017-07-28 – 2017-07-29 (×2): 125 ug via ORAL
  Filled 2017-07-28 (×2): qty 1

## 2017-07-28 MED ORDER — PANTOPRAZOLE SODIUM 40 MG PO TBEC
40.0000 mg | DELAYED_RELEASE_TABLET | Freq: Every day | ORAL | Status: DC
  Administered 2017-07-28 – 2017-07-29 (×2): 40 mg via ORAL
  Filled 2017-07-28 (×2): qty 1

## 2017-07-28 MED ORDER — DILTIAZEM HCL 100 MG IV SOLR
5.0000 mg/h | INTRAVENOUS | Status: DC
  Administered 2017-07-28: 10 mg/h via INTRAVENOUS
  Filled 2017-07-28: qty 100

## 2017-07-28 MED ORDER — DILTIAZEM HCL ER COATED BEADS 180 MG PO CP24
360.0000 mg | ORAL_CAPSULE | Freq: Every day | ORAL | Status: DC
  Administered 2017-07-28 – 2017-07-29 (×2): 360 mg via ORAL
  Filled 2017-07-28 (×2): qty 2

## 2017-07-28 MED ORDER — POTASSIUM CHLORIDE CRYS ER 20 MEQ PO TBCR
20.0000 meq | EXTENDED_RELEASE_TABLET | Freq: Two times a day (BID) | ORAL | Status: DC
  Administered 2017-07-28 – 2017-07-29 (×2): 20 meq via ORAL
  Filled 2017-07-28 (×2): qty 1

## 2017-07-28 MED ORDER — AMIODARONE HCL 200 MG PO TABS: 400.0000 mg | ORAL_TABLET | Freq: Two times a day (BID) | ORAL | Status: DC

## 2017-07-28 MED ORDER — DILTIAZEM HCL 25 MG/5ML IV SOLN
20.0000 mg | Freq: Once | INTRAVENOUS | Status: AC
  Administered 2017-07-28: 20 mg via INTRAVENOUS
  Filled 2017-07-28: qty 5

## 2017-07-28 MED ORDER — DEXTROSE 5 % IV SOLN
5.0000 mg/h | Freq: Once | INTRAVENOUS | Status: AC
  Administered 2017-07-28: 5 mg/h via INTRAVENOUS
  Filled 2017-07-28: qty 100

## 2017-07-28 MED ORDER — APIXABAN 5 MG PO TABS
5.0000 mg | ORAL_TABLET | Freq: Two times a day (BID) | ORAL | Status: DC
  Administered 2017-07-28 – 2017-07-29 (×2): 5 mg via ORAL
  Filled 2017-07-28 (×2): qty 1

## 2017-07-28 MED ORDER — TRAZODONE HCL 50 MG PO TABS
50.0000 mg | ORAL_TABLET | Freq: Every evening | ORAL | Status: DC | PRN
  Administered 2017-07-28: 50 mg via ORAL
  Filled 2017-07-28: qty 1

## 2017-07-28 MED ORDER — ACETAMINOPHEN 325 MG PO TABS: 650.0000 mg | ORAL_TABLET | Freq: Four times a day (QID) | ORAL | Status: DC | PRN

## 2017-07-28 MED ORDER — FUROSEMIDE 10 MG/ML IJ SOLN
40.0000 mg | Freq: Two times a day (BID) | INTRAMUSCULAR | Status: DC
  Administered 2017-07-29: 40 mg via INTRAVENOUS
  Filled 2017-07-28 (×2): qty 4

## 2017-07-28 MED ORDER — LISINOPRIL 5 MG PO TABS
5.0000 mg | ORAL_TABLET | Freq: Every day | ORAL | Status: DC
  Administered 2017-07-28: 5 mg via ORAL
  Filled 2017-07-28 (×2): qty 1

## 2017-07-28 MED ORDER — FUROSEMIDE 10 MG/ML IJ SOLN
40.0000 mg | Freq: Once | INTRAMUSCULAR | Status: AC
  Administered 2017-07-28: 40 mg via INTRAVENOUS
  Filled 2017-07-28: qty 4

## 2017-07-28 MED ORDER — FOLIC ACID 1 MG PO TABS
1.0000 mg | ORAL_TABLET | Freq: Every day | ORAL | Status: DC
  Administered 2017-07-28 – 2017-07-29 (×2): 1 mg via ORAL
  Filled 2017-07-28 (×2): qty 1

## 2017-07-28 MED ORDER — AMIODARONE HCL 200 MG PO TABS
400.0000 mg | ORAL_TABLET | Freq: Every day | ORAL | Status: DC
  Administered 2017-07-28: 400 mg via ORAL
  Filled 2017-07-28: qty 2

## 2017-07-28 NOTE — ED Provider Notes (Signed)
[facility] Emergency Department Provider Note  ___________________________________________   First MD Initiated Contact with Patient 07/28/17 1553     (approximate)  I have reviewed the triage vital signs and the nursing notes.   HISTORY  Chief Complaint Atrial Fibrillation   HPI Brendan Edwards is a 72 y.o. male with a history of atrial fibrillation on Eliquis was presenting to the emergency department today with 24 hours of shortness of breath as well as a rapid heart rate.  He denies any palpitations or chest pain.  However, he says that "I just know when my heart is racing."  He says that he took all of his medications yesterday but has not taken any of his medications today.  He went in for evaluation to Dr. Carrolyn Meiers office he was sent to the emergency department immediately for rapid heart rate in the 150s and 160s.  He says that he is having baseline swelling to his bilateral lower extremities.  Denies cough or fever.  Says that the shortness of breath is worse when he lies back.   Past Medical History:  Diagnosis Date  . A-fib ([place])   . Benign prostatic hyperplasia   . Cardiomyopathy, secondary ([place])   . GERD (gastroesophageal reflux disease)   . Gout   . Hyperlipidemia   . Hypertension   . Obesity   . Sleep apnea     Patient Active Problem List   Diagnosis Date Noted  . HTN (hypertension) 05/10/2017  . HLD (hyperlipidemia) 05/10/2017  . GERD (gastroesophageal reflux disease) 05/10/2017  . Atrial fibrillation with RVR ([place]) 08/10/2016  . Diabetes ([place]) 08/10/2016  . Leukocytosis 08/10/2016  . Fall 08/10/2016  . Alcoholic hepatitis 09/98/3382  . Alcohol withdrawal ([place]) 08/10/2016  . Tobacco abuse counseling 08/10/2016    Past Surgical History:  Procedure Laterality Date  . APPENDECTOMY    . COLONOSCOPY    . COLONOSCOPY WITH PROPOFOL N/A 11/29/2014   Procedure: COLONOSCOPY WITH PROPOFOL;  Surgeon: Hulen Luster, MD;  Location[facility]  ENDOSCOPY;  Service: Gastroenterology;  Laterality: N/A;  . ESOPHAGOGASTRODUODENOSCOPY    . JOINT REPLACEMENT     right and left hip    Prior to Admission medications   Medication Sig Start Date End Date Taking? Authorizing Provider  acetaminophen (TYLENOL) 325 MG tablet Take 2 tablets (650 mg total) by mouth every 6 (six) hours as needed for mild pain (or Fever >/= 101). 05/13/17   Dustin Flock, MD  allopurinol (ZYLOPRIM) 300 MG tablet Take 300 mg by mouth daily.    [provider]  amiodarone (PACERONE) 400 MG tablet Take 1 tablet (400 mg total) by mouth daily. Patient taking differently: Take 400 mg by mouth 2 (two) times daily.  10/12/16   Henreitta Leber, MD  apixaban (ELIQUIS) 5 MG TABS tablet Take 5 mg by mouth 2 (two) times daily.    [provider]  benzocaine (ORAJEL) 10 % mucosal gel Use as directed in the mouth or throat 4 (four) times daily as needed for mouth pain. Patient not taking: Reported on 05/10/2017 08/20/16   Fritzi Mandes, MD  digoxin (LANOXIN) 0.125 MG tablet Take 1 tablet (125 mcg total) by mouth daily. 05/13/17 05/13/18  Dustin Flock, MD  diltiazem (CARDIZEM CD) 360 MG 24 hr capsule Take 1 capsule (360 mg total) by mouth daily. Patient not taking: Reported on 05/10/2017 08/20/16   Fritzi Mandes, MD  folic acid (FOLVITE) 1 MG tablet Take 1 tablet (1 mg total) by  mouth daily. Patient not taking: Reported on 05/10/2017 08/20/16   Fritzi Mandes, MD  furosemide (LASIX) 40 MG tablet Take 40 mg by mouth daily.    [provider]  lisinopril (PRINIVIL,ZESTRIL) 5 MG tablet Take 5 mg by mouth daily.    [provider]  metoprolol succinate (TOPROL-XL) 50 MG 24 hr tablet Take 1 tablet by mouth daily. 04/15/17   [provider]  oxyCODONE-acetaminophen (PERCOCET/ROXICET) 5-325 MG tablet Take 1 tablet by mouth every 6 (six) hours as needed for moderate pain. 05/13/17   Dustin Flock, MD  potassium chloride SA (K-DUR,KLOR-CON) 20 MEQ tablet Take  20 mEq by mouth 2 (two) times daily.    [provider]  thiamine 50 MG tablet Take 1 tablet (50 mg total) by mouth daily. 08/20/16   Fritzi Mandes, MD  traMADol (ULTRAM) 50 MG tablet Take 50 mg by mouth at bedtime.  04/28/17   [provider]    Allergies Oxycodone; Cardura [doxazosin mesylate]; Darvon [propoxyphene]; Penicillins; Requip [ropinirole hcl]; and Septra [sulfamethoxazole-trimethoprim]  Family History  Problem Relation Age of Onset  . Hypertension Mother     Social History Social History   Tobacco Use  . Smoking status: Current Every Day Smoker    Types: Cigars  . Smokeless tobacco: Never Used  Substance Use Topics  . Alcohol use: Yes  . Drug use: No    Review of Systems  Constitutional: No fever/chills Eyes: No visual changes. ENT: No sore throat. Cardiovascular: Denies chest pain. Respiratory: As above. Gastrointestinal: No abdominal pain.  No nausea, no vomiting.  No diarrhea.  No constipation. Genitourinary: Negative for dysuria. Musculoskeletal: Negative for back pain. Skin: Negative for rash. Neurological: Negative for headaches, focal weakness or numbness.   ____________________________________________   PHYSICAL EXAM:  VITAL SIGNS: ED Triage Vitals [07/28/17 1556]  Enc Vitals Group     BP      Pulse      Resp      Temp      Temp src      SpO2      Weight 218 lb (98.9 kg)     Height 5\' 9"  (1.753 m)     Head Circumference      Peak Flow      Pain Score 0     Pain Loc      Pain Edu?      Excl. in [place]?     Constitutional: Alert and oriented. Well appearing and in no acute distress. Eyes: Conjunctivae are normal.  Head: Atraumatic. Nose: No congestion/rhinnorhea. Mouth/Throat: Mucous membranes are moist.  Neck: No stridor.   Cardiovascular: Irregularly irregular rate which is tachycardic. Grossly normal heart sounds.   Respiratory: Normal respiratory effort.  No retractions. Lungs CTAB. Gastrointestinal: Soft and  nontender. No distention. No CVA tenderness. Musculoskeletal: Moderate bilateral lower extremity edema. Neurologic:  Normal speech and language. No gross focal neurologic deficits are appreciated. Skin:  Skin is warm, dry and intact. No rash noted. Psychiatric: Mood and affect are normal. Speech and behavior are normal.  ____________________________________________   LABS (all labs ordered are listed, but only abnormal results are displayed)  Labs Reviewed  CBC - Abnormal; Notable for the following components:      Result Value   MCV 104.3 (*)    MCH 34.9 (*)    RDW 16.6 (*)    All other components within normal limits  BASIC METABOLIC PANEL  TROPONIN I  DIGOXIN LEVEL  BRAIN NATRIURETIC PEPTIDE   ____________________________________________  EKG  ED ECG REPORT I, Doran Stabler, the attending physician, personally viewed and interpreted this ECG.   Date: 07/28/2017  EKG Time: 1559  Rate: 151  Rhythm: atrial fibrillation, rate 151  Axis: normal  Intervals:none  ST&T Change: No ST segment elevation.  Near diffuse T wave depressions which are likely related to the patient's rate.  ____________________________________________  RADIOLOGY  Mild bibasilar subsegmental atelectasis.  Small left pleural effusion. ____________________________________________   PROCEDURES  Procedure(s) performed:   .Critical Care Performed by: Orbie Pyo, MD Authorized by: Orbie Pyo, MD   Critical care provider statement:    Critical care time (minutes):  35   Critical care was time spent personally by me on the following activities:  Examination of patient, ordering and performing treatments and interventions, ordering and review of laboratory studies, ordering and review of radiographic studies, pulse oximetry and re-evaluation of patient's condition Comments:     Patient still with heart rate in the 120s in rapid atrial fibrillation despite 2 IV boluses  of Cardizem.    Critical Care performed:   ____________________________________________   INITIAL IMPRESSION / ASSESSMENT AND PLAN / ED COURSE  Pertinent labs & imaging results that were available during my care of the patient were reviewed by me and considered in my medical decision making (see chart for details).  Differential includes, but is not limited to, viral syndrome, bronchitis including COPD exacerbation, pneumonia, reactive airway disease including asthma, CHF including exacerbation with or without pulmonary/interstitial edema, pneumothorax, ACS, thoracic trauma, and pulmonary embolism. As part of my medical decision making, I reviewed the following data within the electronic MEDICAL RECORD NUMBER Notes from prior ED visits  ----------------------------------------- 6:10 PM on 07/28/2017 -----------------------------------------  Patient likely with pulmonary vascular congestion secondary to rapid atrial fibrillation.  2 doses of Cardizem and still heart rate in the 120s.  We will start a Cardizem drip.  Patient will be admitted to the hospital.  Signed out to Dr. Margaretmary Eddy.  Patient is understanding of the plan and willing to comply. ____________________________________________   FINAL CLINICAL IMPRESSION(S) / ED DIAGNOSES  Atrial fibrillation with RVR.  CHF.  NEW MEDICATIONS STARTED DURING THIS VISIT:  New Prescriptions   No medications on file     Note:  This document was prepared using Dragon voice recognition software and may include unintentional dictation errors.     Orbie Pyo, MD 07/28/17 [phone]

## 2017-07-28 NOTE — ED Notes (Signed)
Pt standing beside bed using a urinal to urinate into.

## 2017-07-28 NOTE — H&P (Signed)
[place] [facility] [place] NAME: Brendan Edwards    MR#:  [id number]  DATE OF BIRTH:  1945-11-01  DATE OF ADMISSION:  07/28/2017  PRIMARY CARE PHYSICIAN: Tracie Harrier, MD   REQUESTING/REFERRING PHYSICIAN: Orbie Pyo, MD  CHIEF COMPLAINT:   Palpitations and shortness of breath HISTORY OF PRESENT ILLNESS:  Brendan Edwards  is a 72 y.o. male with a known history of chronic atrial fibrillation on amiodarone, digoxin, Cardizem, Eliquis, history of cardiomyopathy GERD, gout, hyperlipidemia, hypertension and other medical problems is presenting to the ED with a chief complaint of shortness of breath associated with her palpitations.  Patient's heart rate was found to be at 150s-160s and Dr. Carrolyn Meiers office who has sent this patient to the emergency department.  Patient was given Cardizem boluses with no significant improvement.  Patient is started on Cardizem drip.  Hospitalist team is called to admit the patient.  Patient's BNP is elevated, chest x-ray with small left-sided pleural effusion.  Patient sees Dr. Saralyn Pilar as an outpatient  PAST MEDICAL HISTORY:   Past Medical History:  Diagnosis Date  . A-fib ([place])   . Benign prostatic hyperplasia   . Cardiomyopathy, secondary ([place])   . GERD (gastroesophageal reflux disease)   . Gout   . Hyperlipidemia   . Hypertension   . Obesity   . Sleep apnea     PAST SURGICAL HISTOIRY:   Past Surgical History:  Procedure Laterality Date  . APPENDECTOMY    . COLONOSCOPY    . COLONOSCOPY WITH PROPOFOL N/A 11/29/2014   Procedure: COLONOSCOPY WITH PROPOFOL;  Surgeon: Hulen Luster, MD;  Location[facility] END[facility]COPY;  Service: Gastroenterology;  Laterality: N/A;  . ESOPHAGOGASTRODUODENOSCOPY    . JOINT REPLACEMENT     right and left hip    SOCIAL HISTORY:   Social History   Tobacco Use  . Smoking status: Current Every Day Smoker    Types: Cigars  . Smokeless tobacco: Never Used  Substance Use  Topics  . Alcohol use: Yes    FAMILY HISTORY:   Family History  Problem Relation Age of Onset  . Hypertension Mother     DRUG ALLERGIES:   Allergies  Allergen Reactions  . Oxycodone Other (See Comments)    Hallucinations  . Cardura [Doxazosin Mesylate]   . Darvon [Propoxyphene]   . Penicillins     Has patient had a PCN reaction causing immediate rash, facial/tongue/throat swelling, SOB or lightheadedness with hypotension: Yes Has patient had a PCN reaction causing severe rash involving mucus membranes or skin necrosis: No Has patient had a PCN reaction that required hospitalization: No Has patient had a PCN reaction occurring within the last 10 years: No If all of the above answers are "NO", then may proceed with Cephalosporin use.  Marland Kitchen Requip [Ropinirole Hcl]   . Septra [Sulfamethoxazole-Trimethoprim]     REVIEW OF SYSTEMS:  CONSTITUTIONAL: No fever, fatigue or weakness.  EYES: No blurred or double vision.  EARS, NOSE, AND THROAT: No tinnitus or ear pain.  RESPIRATORY: No cough, reporting exertional shortness of breath, denies wheezing or hemoptysis.  CARDIOVASCULAR: No chest pain, orthopnea, edema.  GASTROINTESTINAL: No nausea, vomiting, diarrhea or abdominal pain.  GENITOURINARY: No dysuria, hematuria.  ENDOCRINE: No polyuria, nocturia,  HEMATOLOGY: No anemia, easy bruising or bleeding SKIN: No rash or lesion. MUSCULOSKELETAL: No joint pain or arthritis.   NEUROLOGIC: No tingling, numbness, weakness.  PSYCHIATRY: No anxiety or depression.   MEDICATIONS AT HOME:   Prior  to Admission medications   Medication Sig Start Date End Date Taking? Authorizing Provider  acetaminophen (TYLENOL) 325 MG tablet Take 2 tablets (650 mg total) by mouth every 6 (six) hours as needed for mild pain (or Fever >/= 101). 05/13/17  Yes Dustin Flock, MD  allopurinol (ZYLOPRIM) 300 MG tablet Take 300 mg by mouth daily.   Yes [provider]  amiodarone (PACERONE) 400 MG tablet Take  1 tablet (400 mg total) by mouth daily. Patient taking differently: Take 400 mg by mouth 2 (two) times daily.  10/12/16  Yes Sainani, Belia Heman, MD  apixaban (ELIQUIS) 5 MG TABS tablet Take 5 mg by mouth 2 (two) times daily.   Yes [provider]  digoxin (LANOXIN) 0.125 MG tablet Take 1 tablet (125 mcg total) by mouth daily. 05/13/17 05/13/18 Yes Dustin Flock, MD  furosemide (LASIX) 40 MG tablet Take 40 mg by mouth daily.   Yes [provider]  lisinopril (PRINIVIL,ZESTRIL) 5 MG tablet Take 5 mg by mouth daily.   Yes [provider]  oxyCODONE-acetaminophen (PERCOCET/ROXICET) 5-325 MG tablet Take 1 tablet by mouth every 6 (six) hours as needed for moderate pain. 05/13/17  Yes Dustin Flock, MD  potassium chloride SA (K-DUR,KLOR-CON) 20 MEQ tablet Take 20 mEq by mouth 2 (two) times daily.   Yes [provider]  traMADol (ULTRAM) 50 MG tablet Take 50 mg by mouth at bedtime.  04/28/17  Yes [provider]  benzocaine (ORAJEL) 10 % mucosal gel Use as directed in the mouth or throat 4 (four) times daily as needed for mouth pain. Patient not taking: Reported on 05/10/2017 08/20/16   Fritzi Mandes, MD  diltiazem (CARDIZEM CD) 360 MG 24 hr capsule Take 1 capsule (360 mg total) by mouth daily. Patient not taking: Reported on 05/10/2017 08/20/16   Fritzi Mandes, MD  folic acid (FOLVITE) 1 MG tablet Take 1 tablet (1 mg total) by mouth daily. Patient not taking: Reported on 05/10/2017 08/20/16   Fritzi Mandes, MD  thiamine 50 MG tablet Take 1 tablet (50 mg total) by mouth daily. Patient not taking: Reported on 07/28/2017 08/20/16   Fritzi Mandes, MD      VITAL SIGNS:  Blood pressure (!) 141/99, pulse (!) 113, temperature 98.7 F (37.1 C), temperature source Oral, resp. rate 18, height 5\' 9"  (1.753 m), weight 98.9 kg (218 lb), SpO2 92 %.  PHYSICAL EXAMINATION:  GENERAL:  72 y.o.-year-old patient lying in the bed with no acute distress.  EYES: Pupils equal, round, reactive to  light and accommodation. No scleral icterus. Extraocular muscles intact.  HEENT: Head atraumatic, normocephalic. Oropharynx and nasopharynx clear.  NECK:  Supple, no jugular venous distention. No thyroid enlargement, no tenderness.  LUNGS: Normal breath sounds bilaterally, no wheezing, rales,rhonchi or crepitation. No use of accessory muscles of respiration.  CARDIOVASCULAR: Irregularly regular no murmurs, rubs, or gallops.  ABDOMEN: Soft, nontender, nondistended. Bowel sounds present.  EXTREMITIES: No pedal edema, cyanosis, or clubbing.  NEUROLOGIC: Cranial nerves II through XII are intact. Muscle strength 5/5 in all extremities. Sensation intact. Gait not checked.  PSYCHIATRIC: The patient is alert and oriented x 3.  SKIN: No obvious rash, lesion, or ulcer.   LABORATORY PANEL:   CBC Recent Labs  Lab 07/28/17 1602  WBC 7.3  HGB 16.4  HCT 49.1  PLT 207   ------------------------------------------------------------------------------------------------------------------  Chemistries  Recent Labs  Lab 07/28/17 1602  NA 136  K 4.3  CL 103  CO2 26  GLUCOSE 113*  BUN 14  CREATININE 0.99  CALCIUM 8.5*   ------------------------------------------------------------------------------------------------------------------  Cardiac Enzymes Recent Labs  Lab 07/28/17 1602  TROPONINI 0.07*   ------------------------------------------------------------------------------------------------------------------  RADIOLOGY:  Dg Chest 2 View  Result Date: 07/28/2017 CLINICAL DATA:  Shortness of breath. EXAM: CHEST - 2 VIEW COMPARISON:  Radiographs of May 10, 2017. FINDINGS: Stable cardiomegaly with central pulmonary vascular congestion. Atherosclerosis of thoracic aorta is noted. No pneumothorax is noted. Small left pleural effusion is noted. Mild bibasilar subsegmental atelectasis is noted. Stable old lower thoracic compression fracture is noted. IMPRESSION: Mild bibasilar subsegmental  atelectasis. Small left pleural effusion is noted. Aortic Atherosclerosis (ICD10-I70.0). Electronically Signed   By: Marijo Conception, M.D.   On: 07/28/2017 16:37    EKG:   Orders placed or performed during the hospital encounter of 07/28/17  . ED EKG within 10 minutes  . ED EKG within 10 minutes  . EKG 12-Lead  . EKG 12-Lead    IMPRESSION AND PLAN:   Brendan Edwards  is a 72 y.o. male with a known history of chronic atrial fibrillation on amiodarone, digoxin, Cardizem, Eliquis, history of cardiomyopathy GERD, gout, hyperlipidemia, hypertension and other medical problems is presenting to the ED with a chief complaint of shortness of breath associated with her palpitations.  Patient's heart rate was found to be at 150s-160s and Dr. Carrolyn Meiers office who has sent this patient to the emergency department.  Patient was given Cardizem boluses with no significant improvement.  Patient is started on Cardizem drip  #Atrial fibrillation with RVR  telemetry, monitor patient on telemetry Cardizem drip and titrate as needed Resume home dose Cardizem which was discontinued by the patient.  Continue amiodarone 400 mg p.o. twice a day and digoxin which are his home medication Continue Eliquis home medication for anticoagulation Consult placed to Dr. Ubaldo Glassing and discussed with him he is aware  #Acute on chronic diastolic CHF Lasix 40 mg IV every 12 hours and potassium supplementation Will get echocardiogram previous echocardiogram in 2018 has revealed 55-60% ejection fraction Monitor daily weight and intake and output  #Elevated troponin could be from demand ischemia from atrial fibrillation with RVR and acute diastolic CHF Monitor on telemetry and cycle cardiac biomarkers.  #Diabetes mellitus Check hemoglobin A1c and sliding scale insulin  #Essential hypertension continue home medication lisinopril, Cardizem CD   #GERD ppi  #Tobacco abuse disorder Counseled patient to quit smoking for 5 minutes.   Patient verbalized understanding but refusing nicotine patch    All the records are reviewed and case discussed with ED provider. Management plans discussed with the patient, wife at bedside and they are in agreement.  CODE STATUS: fc , healthcare power of attorney girlfriend Velva Harman  TOTAL CRITICAL CARE TIME TAKING CARE OF THIS PATIENT: 45 minutes.   Note: This dictation was prepared with Dragon dictation along with smaller phrase technology. Any transcriptional errors that result from this process are unintentional.  Nicholes Mango M.D on 07/28/2017 at 7:19 PM  Between 7am to 6pm - Pager - [phone]  After 6pm go to [email or web address] - password [email or web address] [place] [facility]  Office  [phone]  CC: Primary care physician; Tracie Harrier, MD

## 2017-07-28 NOTE — ED Notes (Signed)
Date and time results received: 07/28/17 1655   Test: troponin Critical Value: 0.07  Name of Provider Notified: Dr. Clearnce Hasten

## 2017-07-28 NOTE — ED Notes (Signed)
Pt in xray

## 2017-07-28 NOTE — ED Triage Notes (Signed)
Pt arrives from [facility] sent to ED for a fib RVR. States last night had "issues breathing" and that's why he was at [facility]. Pt is alert, oriented, using a cane to get from wheelchair. States SOB when lying flat last night, states L leg increased swelling.

## 2017-07-28 NOTE — ED Triage Notes (Signed)
FIRST NURSE NOTE-room 5 held for pt. Fib rvr from [facility] office.  New orthopnea last night.

## 2017-07-28 NOTE — ED Notes (Signed)
Pt denies CP last night or today. Denies feeling heart racing. Denies SOB at this time. Alert, oriented, sitting up in bed. States takes metoprolol for a fib. Also takes eliquis.

## 2017-07-29 ENCOUNTER — Inpatient Hospital Stay
Admit: 2017-07-29 | Discharge: 2017-07-29 | Disposition: A | Discharge status: Home / Self Care | Payer: PPO | Attending: Internal Medicine | Admitting: Internal Medicine

## 2017-07-29 LAB — CBC
HCT: 44.6 % (ref 40.0–52.0)
Hemoglobin: 15.2 g/dL (ref 13.0–18.0)
MCH: 35.1 pg — ABNORMAL HIGH (ref 26.0–34.0)
MCHC: 34 g/dL (ref 32.0–36.0)
MCV: 103.2 fL — ABNORMAL HIGH (ref 80.0–100.0)
PLATELETS: 180 10*3/uL (ref 150–440)
RBC: 4.32 MIL/uL — AB (ref 4.40–5.90)
RDW: 16.6 % — ABNORMAL HIGH (ref 11.5–14.5)
WBC: 4.7 10*3/uL (ref 3.8–10.6)

## 2017-07-29 LAB — BASIC METABOLIC PANEL
ANION GAP: 7 (ref 5–15)
BUN: 14 mg/dL (ref 6–20)
CO2: 28 mmol/L (ref 22–32)
Calcium: 7.9 mg/dL — ABNORMAL LOW (ref 8.9–10.3)
Chloride: 101 mmol/L (ref 101–111)
Creatinine, Ser: 0.95 mg/dL (ref 0.61–1.24)
GFR calc Af Amer: >60 mL/min (ref >60)
Glucose, Bld: 107 mg/dL — ABNORMAL HIGH (ref 65–99)
POTASSIUM: 3.4 mmol/L — AB (ref 3.5–5.1)
SODIUM: 136 mmol/L (ref 135–145)

## 2017-07-29 LAB — TROPONIN I
Troponin I: 0.09 ng/mL (ref <0.03)
Troponin I: 0.09 ng/mL (ref <0.03)

## 2017-07-29 LAB — ECHOCARDIOGRAM COMPLETE
HEIGHTINCHES: 68 in
WEIGHTICAEL: 3371.2 [oz_av]

## 2017-07-29 LAB — HEMOGLOBIN A1C
Hgb A1c MFr Bld: 5.3 % (ref 4.8–5.6)
MEAN PLASMA GLUCOSE: 105.41 mg/dL

## 2017-07-29 LAB — TSH: TSH: 3.392 u[IU]/mL (ref 0.350–4.500)

## 2017-07-29 MED ORDER — DILTIAZEM HCL ER COATED BEADS 360 MG PO CP24: 360.0000 mg | ORAL_CAPSULE | Freq: Every day | ORAL | 1 refills | Status: AC

## 2017-07-29 MED ORDER — AMIODARONE HCL 200 MG PO TABS
200.0000 mg | ORAL_TABLET | Freq: Two times a day (BID) | ORAL | Status: DC
  Administered 2017-07-29: 200 mg via ORAL
  Filled 2017-07-29: qty 1

## 2017-07-29 MED ORDER — AMIODARONE HCL 200 MG PO TABS
200.0000 mg | ORAL_TABLET | Freq: Two times a day (BID) | ORAL | 0 refills | Status: AC
Start: 2017-07-29 — End: ?

## 2017-07-29 NOTE — Consult Note (Signed)
Cardiology Consultation Note    Patient ID: Brendan Edwards, MRN: [id number], DOB/AGE: January 28, 1946 72 y.o. Admit date: 07/28/2017   Date of Consult: 07/29/2017 Primary Physician: Tracie Harrier, MD Primary Cardiologist: Dr. Saralyn Pilar  Chief Complaint: sob Reason for Consultation: afib Requesting MD: Dr. Margaretmary Eddy  HPI: Brendan Edwards is a 72 y.o. male with history of chronic atrial fibrillation, treated with amiodarone, digoxin and Cardizem and anticoagulated with apixaban, history of ethanol abuse, history of tobacco abuse, history of hypertension, hyperlipidemia nonischemic cardiomyopathy with a known ejection fraction of 40-45% who was admitted after being sent to the emergency room by his primary care physician office for atrial fibrillation with rapid ventricular response and shortness of breath.  In the emergency room was noted to have atrial fibrillation with rapid ventricular response.  Was given Cardizem boluses followed by a Cardizem drip.  Chest x-ray showed small pleural effusion.  He has ruled out for myocardial infarction is trivial troponin elevation secondary to rapid ventricular response and demand ischemia.  The natruretic peptide was increased.  His digoxin level was negligible.Pt admits to missing one to several days of his medications prior to admission.  Past Medical History:  Diagnosis Date  . A-fib ([place])   . Benign prostatic hyperplasia   . Cardiomyopathy, secondary ([place])   . GERD (gastroesophageal reflux disease)   . Gout   . Hyperlipidemia   . Hypertension   . Obesity   . Sleep apnea       Surgical History:  Past Surgical History:  Procedure Laterality Date  . APPENDECTOMY    . COLONOSCOPY    . COLONOSCOPY WITH PROPOFOL N/A 11/29/2014   Procedure: COLONOSCOPY WITH PROPOFOL;  Surgeon: Hulen Luster, MD;  Location: [facility] ENDOSCOPY;  Service: Gastroenterology;  Laterality: N/A;  . ESOPHAGOGASTRODUODENOSCOPY    . JOINT REPLACEMENT     right and left hip     Home  Meds: Prior to Admission medications   Medication Sig Start Date End Date Taking? Authorizing Provider  acetaminophen (TYLENOL) 325 MG tablet Take 2 tablets (650 mg total) by mouth every 6 (six) hours as needed for mild pain (or Fever >/= 101). 05/13/17  Yes Dustin Flock, MD  allopurinol (ZYLOPRIM) 300 MG tablet Take 300 mg by mouth daily.   Yes [provider]  amiodarone (PACERONE) 400 MG tablet Take 1 tablet (400 mg total) by mouth daily. Patient taking differently: Take 400 mg by mouth 2 (two) times daily.  10/12/16  Yes Sainani, Belia Heman, MD  apixaban (ELIQUIS) 5 MG TABS tablet Take 5 mg by mouth 2 (two) times daily.   Yes [provider]  digoxin (LANOXIN) 0.125 MG tablet Take 1 tablet (125 mcg total) by mouth daily. 05/13/17 05/13/18 Yes Dustin Flock, MD  furosemide (LASIX) 40 MG tablet Take 40 mg by mouth daily.   Yes [provider]  lisinopril (PRINIVIL,ZESTRIL) 5 MG tablet Take 5 mg by mouth daily.   Yes [provider]  oxyCODONE-acetaminophen (PERCOCET/ROXICET) 5-325 MG tablet Take 1 tablet by mouth every 6 (six) hours as needed for moderate pain. 05/13/17  Yes Dustin Flock, MD  potassium chloride SA (K-DUR,KLOR-CON) 20 MEQ tablet Take 20 mEq by mouth 2 (two) times daily.   Yes [provider]  traMADol (ULTRAM) 50 MG tablet Take 50 mg by mouth at bedtime.  04/28/17  Yes [provider]  benzocaine (ORAJEL) 10 % mucosal gel Use as directed in the mouth or throat 4 (four) times daily as needed  for mouth pain. Patient not taking: Reported on 05/10/2017 08/20/16   Fritzi Mandes, MD  diltiazem (CARDIZEM CD) 360 MG 24 hr capsule Take 1 capsule (360 mg total) by mouth daily. Patient not taking: Reported on 05/10/2017 08/20/16   Fritzi Mandes, MD  folic acid (FOLVITE) 1 MG tablet Take 1 tablet (1 mg total) by mouth daily. Patient not taking: Reported on 05/10/2017 08/20/16   Fritzi Mandes, MD  thiamine 50 MG tablet Take 1 tablet (50 mg total) by  mouth daily. Patient not taking: Reported on 07/28/2017 08/20/16   Fritzi Mandes, MD    Inpatient Medications:  . allopurinol  300 mg Oral Daily  . amiodarone  400 mg Oral BID  . apixaban  5 mg Oral BID  . digoxin  125 mcg Oral Daily  . diltiazem  360 mg Oral Daily  . folic acid  1 mg Oral Daily  . furosemide  40 mg Intravenous BID  . lisinopril  5 mg Oral Daily  . pantoprazole  40 mg Oral Daily  . potassium chloride SA  20 mEq Oral BID  . thiamine  50 mg Oral Daily  . traMADol  50 mg Oral QHS   . diltiazem (CARDIZEM) infusion Stopped (07/29/17 0103)    Allergies:  Allergies  Allergen Reactions  . Oxycodone Other (See Comments)    Hallucinations  . Cardura [Doxazosin Mesylate]   . Darvon [Propoxyphene]   . Penicillins     Has patient had a PCN reaction causing immediate rash, facial/tongue/throat swelling, SOB or lightheadedness with hypotension: Yes Has patient had a PCN reaction causing severe rash involving mucus membranes or skin necrosis: No Has patient had a PCN reaction that required hospitalization: No Has patient had a PCN reaction occurring within the last 10 years: No If all of the above answers are "NO", then may proceed with Cephalosporin use.  Marland Kitchen Requip [Ropinirole Hcl]   . Septra [Sulfamethoxazole-Trimethoprim]     Social History   Socioeconomic History  . Marital status: Widowed    Spouse name: Not on file  . Number of children: Not on file  . Years of education: Not on file  . Highest education level: Not on file  Occupational History  . Not on file  Social Needs  . Financial resource strain: Not on file  . Food insecurity:    Worry: Not on file    Inability: Not on file  . Transportation needs:    Medical: Not on file    Non-medical: Not on file  Tobacco Use  . Smoking status: Current Every Day Smoker    Types: Cigars  . Smokeless tobacco: Never Used  Substance and Sexual Activity  . Alcohol use: Yes  . Drug use: No  . Sexual activity: Not  on file  Lifestyle  . Physical activity:    Days per week: Not on file    Minutes per session: Not on file  . Stress: Not on file  Relationships  . Social connections:    Talks on phone: Not on file    Gets together: Not on file    Attends religious service: Not on file    Active member of club or organization: Not on file    Attends meetings of clubs or organizations: Not on file    Relationship status: Not on file  . Intimate partner violence:    Fear of current or ex partner: Not on file    Emotionally abused: Not on file    Physically  abused: Not on file    Forced sexual activity: Not on file  Other Topics Concern  . Not on file  Social History Narrative  . Not on file     Family History  Problem Relation Age of Onset  . Hypertension Mother      Review of Systems: A 12-system review of systems was performed and is negative except as noted in the HPI.  Labs: Recent Labs    07/28/17 1602 07/28/17 1937 07/29/17 0140  TROPONINI 0.07* 0.08* 0.09*   Lab Results  Component Value Date   WBC 4.7 07/29/2017   HGB 15.2 07/29/2017   HCT 44.6 07/29/2017   MCV 103.2 (H) 07/29/2017   PLT 180 07/29/2017    Recent Labs  Lab 07/29/17 0140  NA 136  K 3.4*  CL 101  CO2 28  BUN 14  CREATININE 0.95  CALCIUM 7.9*  GLUCOSE 107*   No results found for: CHOL, HDL, LDLCALC, TRIG No results found for: DDIMER  Radiology/Studies:  Dg Chest 2 View  Result Date: 07/28/2017 CLINICAL DATA:  Shortness of breath. EXAM: CHEST - 2 VIEW COMPARISON:  Radiographs of May 10, 2017. FINDINGS: Stable cardiomegaly with central pulmonary vascular congestion. Atherosclerosis of thoracic aorta is noted. No pneumothorax is noted. Small left pleural effusion is noted. Mild bibasilar subsegmental atelectasis is noted. Stable old lower thoracic compression fracture is noted. IMPRESSION: Mild bibasilar subsegmental atelectasis. Small left pleural effusion is noted. Aortic Atherosclerosis  (ICD10-I70.0). Electronically Signed   By: Marijo Conception, M.D.   On: 07/28/2017 16:37    Wt Readings from Last 3 Encounters:  07/29/17 95.6 kg (210 lb 11.2 oz)  05/13/17 93.8 kg (206 lb 11.2 oz)  10/08/16 94.2 kg (207 lb 11.2 oz)    EKG: Atrial fibrillation with rapid ventricular response.  No ischemia.  Physical Exam:  Blood pressure 101/64, pulse 91, temperature 98.3 F (36.8 C), resp. rate 18, height 5\' 8"  (1.727 m), weight 95.6 kg (210 lb 11.2 oz), SpO2 91 %. Body mass index is 32.04 kg/m. General: Well developed, well nourished, in no acute distress. Head: Normocephalic, atraumatic, sclera non-icteric, no xanthomas, nares are without discharge.  Neck: Negative for carotid bruits. JVD not elevated. Lungs: Bilateral rhonchi. Heart: Irregularly irregular rhythm with S1 S2. No murmurs, rubs, or gallops appreciated. Abdomen: Soft, non-tender, non-distended with normoactive bowel sounds. No hepatomegaly. No rebound/guarding. No obvious abdominal masses. Msk:  Strength and tone appear normal for age. Extremities: No clubbing or cyanosis. No edema.  Distal pedal pulses are 2+ and equal bilaterally. Neuro: Alert and oriented X 3. No facial asymmetry. No focal deficit. Moves all extremities spontaneously. Psych:  Responds to questions appropriately with a normal affect.     Assessment and Plan  72 year old male with history of chronic atrial fibrillation treated with amiodarone, Cardizem, digoxin as an outpatient and anticoagulated with apixaban who was admitted after referred to the ER with increased ventricular response.  He appears to rule out for myocardial infarction with mildly elevated serum troponins however this appears to be secondary to demand ischemia and not acute coronary syndrome.  Patient has a history of alcohol and tobacco abuse.  This will need to be again discussed.  Plan: Atrial fibrillation-continue with amiodarone.  Would agree with increasing dose and will change  to  200 twice daily and continuing Cardizem CD 360 mg daily and digoxin 0.125 mg daily.  Would continue with apixaban at 5 mg twice daily.  Will  discontinue Cardizem drip. Rate  appears controlled at present. Discussed importance of compliance with meds. If rate stays controlled this am, would ambulate and dischare to home.   Elevated troponin-appears to be secondary to demand ischemia.  Does not appear to be secondary to an acute coronary syndrome    Signed, Teodoro Spray MD 07/29/2017, 6:46 AM Pager: [phone]

## 2017-07-29 NOTE — Consult Note (Signed)
[facility] Cardiology Consultation Note  Patient ID: Brendan Edwards, MRN: [id number], DOB/AGE: 1945/10/31 72 y.o. Admit date: 07/28/2017   Date of Consult: 07/29/2017 Primary Physician: Tracie Harrier, MD Primary Cardiologist: Raynelle Chary  Chief Complaint:  Chief Complaint  Patient presents with  . Atrial Fibrillation   Reason for Consult: Atrial fibrillation with rapid ventricular rate and diastolic dysfunction heart failure  HPI: 72 y.o. male with known essential hypertension mixed hyperlipidemia sleep apnea diabetes with complications and apparent chronic nonvalvular atrial fibrillation who has been on appropriate medication management for heart rate control risk reduction for stroke as well as hypertension control who has had a significant progression of lower extremity edema significant shortness of breath PND and orthopnea over 2-3-day.  The patient has had admission from the emergency room showing some pulmonary edema hypoxia cough and congestion consistent with acute on chronic diastolic dysfunction congestive heart failure as well as atrial fibrillation with rapid ventricular rate at 151 bpm.  The patient has been started on diltiazem drip as well as given intravenous diltiazem for which the patient has much better heart rate control at this time.  The patient heart rate is 89 bpm with reinstatement of previous medication management including amiodarone digoxin and diltiazem combination.  The patient also received intravenous Lasix with significant improvements of hypoxia pulmonary edema and shortness of breath as well as lower extremity edema and feels much more comfortable this morning.  The patient does have a troponin level of 0.09 but consistent and no evidence of peak more consistent with demand ischemia rather than acute coronary syndrome.  He does remain on Eliquis for further risk reduction and stroke with atrial fibrillation without any bleeding complications at this time.   Therefore will continue current approach which is working quite well  Past Medical History:  Diagnosis Date  . A-fib ([place])   . Benign prostatic hyperplasia   . Cardiomyopathy, secondary ([place])   . GERD (gastroesophageal reflux disease)   . Gout   . Hyperlipidemia   . Hypertension   . Obesity   . Sleep apnea       Surgical History:  Past Surgical History:  Procedure Laterality Date  . APPENDECTOMY    . COLONOSCOPY    . COLONOSCOPY WITH PROPOFOL N/A 11/29/2014   Procedure: COLONOSCOPY WITH PROPOFOL;  Surgeon: Hulen Luster, MD;  Location: [facility] ENDOSCOPY;  Service: Gastroenterology;  Laterality: N/A;  . ESOPHAGOGASTRODUODENOSCOPY    . JOINT REPLACEMENT     right and left hip     Home Meds: Prior to Admission medications   Medication Sig Start Date End Date Taking? Authorizing Provider  acetaminophen (TYLENOL) 325 MG tablet Take 2 tablets (650 mg total) by mouth every 6 (six) hours as needed for mild pain (or Fever >/= 101). 05/13/17  Yes Dustin Flock, MD  allopurinol (ZYLOPRIM) 300 MG tablet Take 300 mg by mouth daily.   Yes [provider]  amiodarone (PACERONE) 400 MG tablet Take 1 tablet (400 mg total) by mouth daily. Patient taking differently: Take 400 mg by mouth 2 (two) times daily.  10/12/16  Yes Sainani, Belia Heman, MD  apixaban (ELIQUIS) 5 MG TABS tablet Take 5 mg by mouth 2 (two) times daily.   Yes [provider]  digoxin (LANOXIN) 0.125 MG tablet Take 1 tablet (125 mcg total) by mouth daily. 05/13/17 05/13/18 Yes Dustin Flock, MD  furosemide (LASIX) 40 MG tablet Take 40 mg by mouth daily.   Yes [provider]  lisinopril (  PRINIVIL,ZESTRIL) 5 MG tablet Take 5 mg by mouth daily.   Yes [provider]  oxyCODONE-acetaminophen (PERCOCET/ROXICET) 5-325 MG tablet Take 1 tablet by mouth every 6 (six) hours as needed for moderate pain. 05/13/17  Yes Dustin Flock, MD  potassium chloride SA (K-DUR,KLOR-CON) 20 MEQ tablet Take 20 mEq by mouth 2  (two) times daily.   Yes [provider]  traMADol (ULTRAM) 50 MG tablet Take 50 mg by mouth at bedtime.  04/28/17  Yes [provider]  benzocaine (ORAJEL) 10 % mucosal gel Use as directed in the mouth or throat 4 (four) times daily as needed for mouth pain. Patient not taking: Reported on 05/10/2017 08/20/16   Fritzi Mandes, MD  diltiazem (CARDIZEM CD) 360 MG 24 hr capsule Take 1 capsule (360 mg total) by mouth daily. Patient not taking: Reported on 05/10/2017 08/20/16   Fritzi Mandes, MD  folic acid (FOLVITE) 1 MG tablet Take 1 tablet (1 mg total) by mouth daily. Patient not taking: Reported on 05/10/2017 08/20/16   Fritzi Mandes, MD  thiamine 50 MG tablet Take 1 tablet (50 mg total) by mouth daily. Patient not taking: Reported on 07/28/2017 08/20/16   Fritzi Mandes, MD    Inpatient Medications:  . allopurinol  300 mg Oral Daily  . amiodarone  400 mg Oral BID  . apixaban  5 mg Oral BID  . digoxin  125 mcg Oral Daily  . diltiazem  360 mg Oral Daily  . folic acid  1 mg Oral Daily  . furosemide  40 mg Intravenous BID  . lisinopril  5 mg Oral Daily  . pantoprazole  40 mg Oral Daily  . potassium chloride SA  20 mEq Oral BID  . thiamine  50 mg Oral Daily  . traMADol  50 mg Oral QHS   . diltiazem (CARDIZEM) infusion Stopped (07/29/17 0103)    Allergies:  Allergies  Allergen Reactions  . Oxycodone Other (See Comments)    Hallucinations  . Cardura [Doxazosin Mesylate]   . Darvon [Propoxyphene]   . Penicillins     Has patient had a PCN reaction causing immediate rash, facial/tongue/throat swelling, SOB or lightheadedness with hypotension: Yes Has patient had a PCN reaction causing severe rash involving mucus membranes or skin necrosis: No Has patient had a PCN reaction that required hospitalization: No Has patient had a PCN reaction occurring within the last 10 years: No If all of the above answers are "NO", then may proceed with Cephalosporin use.  Marland Kitchen Requip [Ropinirole Hcl]   .  Septra [Sulfamethoxazole-Trimethoprim]     Social History   Socioeconomic History  . Marital status: Widowed    Spouse name: Not on file  . Number of children: Not on file  . Years of education: Not on file  . Highest education level: Not on file  Occupational History  . Not on file  Social Needs  . Financial resource strain: Not on file  . Food insecurity:    Worry: Not on file    Inability: Not on file  . Transportation needs:    Medical: Not on file    Non-medical: Not on file  Tobacco Use  . Smoking status: Current Every Day Smoker    Types: Cigars  . Smokeless tobacco: Never Used  Substance and Sexual Activity  . Alcohol use: Yes  . Drug use: No  . Sexual activity: Not on file  Lifestyle  . Physical activity:    Days per week: Not on file  Minutes per session: Not on file  . Stress: Not on file  Relationships  . Social connections:    Talks on phone: Not on file    Gets together: Not on file    Attends religious service: Not on file    Active member of club or organization: Not on file    Attends meetings of clubs or organizations: Not on file    Relationship status: Not on file  . Intimate partner violence:    Fear of current or ex partner: Not on file    Emotionally abused: Not on file    Physically abused: Not on file    Forced sexual activity: Not on file  Other Topics Concern  . Not on file  Social History Narrative  . Not on file     Family History  Problem Relation Age of Onset  . Hypertension Mother      Review of Systems Positive for shortness of breath cough congestion PND orthopnea Negative for: General:  chills, fever, night sweats or weight changes.  Cardiovascular: Positive for PND orthopnea negative for syncope dizziness  Dermatological skin lesions rashes Respiratory: Positive for cough congestion Urologic: Frequent urination urination at night and hematuria Abdominal: negative for nausea, vomiting, diarrhea, bright red blood  per rectum, melena, or hematemesis Neurologic: negative for visual changes, and/or hearing changes  All other systems reviewed and are otherwise negative except as noted above.  Labs: Recent Labs    07/28/17 1602 07/28/17 1937 07/29/17 0140  TROPONINI 0.07* 0.08* 0.09*   Lab Results  Component Value Date   WBC 4.7 07/29/2017   HGB 15.2 07/29/2017   HCT 44.6 07/29/2017   MCV 103.2 (H) 07/29/2017   PLT 180 07/29/2017    Recent Labs  Lab 07/29/17 0140  NA 136  K 3.4*  CL 101  CO2 28  BUN 14  CREATININE 0.95  CALCIUM 7.9*  GLUCOSE 107*   No results found for: CHOL, HDL, LDLCALC, TRIG No results found for: DDIMER  Radiology/Studies:  Dg Chest 2 View  Result Date: 07/28/2017 CLINICAL DATA:  Shortness of breath. EXAM: CHEST - 2 VIEW COMPARISON:  Radiographs of May 10, 2017. FINDINGS: Stable cardiomegaly with central pulmonary vascular congestion. Atherosclerosis of thoracic aorta is noted. No pneumothorax is noted. Small left pleural effusion is noted. Mild bibasilar subsegmental atelectasis is noted. Stable old lower thoracic compression fracture is noted. IMPRESSION: Mild bibasilar subsegmental atelectasis. Small left pleural effusion is noted. Aortic Atherosclerosis (ICD10-I70.0). Electronically Signed   By: Marijo Conception, M.D.   On: 07/28/2017 16:37    EKG: Atrial fibrillation with rapid ventricular rate and nonspecific ST and T wave changes  Weights: Filed Weights   07/28/17 1556 07/28/17 1858 07/29/17 0410  Weight: 218 lb (98.9 kg) 211 lb 14.4 oz (96.1 kg) 210 lb 11.2 oz (95.6 kg)     Physical Exam: Blood pressure 101/64, pulse 91, temperature 98.3 F (36.8 C), resp. rate 18, height 5\' 8"  (1.727 m), weight 210 lb 11.2 oz (95.6 kg), SpO2 91 %. Body mass index is 32.04 kg/m. General: Well developed, well nourished, in no acute distress. Head eyes ears nose throat: Normocephalic, atraumatic, sclera non-icteric, no xanthomas, nares are without discharge. No  apparent thyromegaly and/or mass  Lungs: Normal respiratory effort.  Some wheezes, few basilar rales, no rhonchi.  Heart: Irregular with normal S1 S2. no murmur gallop, no rub, PMI is normal size and placement, carotid upstroke normal without bruit, jugular venous pressure is normal Abdomen: Soft,  non-tender -distended with normoactive bowel sounds. No hepatomegaly. No rebound/guarding. No obvious abdominal masses. Abdominal aorta is normal size without bruit Extremities: Trace edema. no cyanosis, no clubbing, no ulcers  Peripheral : 2+ bilateral upper extremity pulses, 2+ bilateral femoral pulses, 2+ bilateral dorsal pedal pulse Neuro: Alert and oriented. No facial asymmetry. No focal deficit. Moves all extremities spontaneously. Musculoskeletal: Normal muscle tone without kyphosis Psych:  Responds to questions appropriately with a normal affect.    Assessment: 72 year old male with acute on chronic diastolic dysfunction congestive heart failure possibly secondary to atrial fibrillation with rapid ventricular rate and with a background of sleep apnea diabetes and hypertension without evidence of myocardial infarction at this time  Plan: 1.  Continue and reinstate outpatient medication management for heart rate control of atrial fibrillation including amiodarone digoxin and oral diltiazem 2.  Okay for discontinuation of diltiazem drip due to heart rate control at 89 bpm 3.  Continue diuresis for acute on chronic diastolic dysfunction congestive heart failure with intravenous Lasix and change to oral Lasix as able 4.  No further cardiac intervention or diagnostics necessary at this time due to no current evidence of myocardial infarction with minimal elevation of troponin consistent with demand ischemia 5.  Continue anticoagulation for further risk reduction and stroke with atrial fibrillation 6.  Diligent use of CPAP machine and oxygenation as necessary 7.  Begin ambulation and follow for  improvements of symptoms and possible discharge today if able for further follow-up as outpatient next week for further adjustments of medication management Signed, Corey Skains M.D. [facility] Cardiology 07/29/2017, 7:44 AM

## 2017-07-29 NOTE — Progress Notes (Addendum)
Pt BP as at 119/77 and have schedule Lisinopril. Digoxin, amiodarone, and cardizem. Doctor Gouru was notified. Awaiting callback. Will continue to monitor.  Update [phone]) Doctor Gouru called and ordered to hold lisinopril and give digoxin, cardizem and amiodarone. Will continue to monitor.

## 2017-07-29 NOTE — Discharge Summary (Signed)
[facility] NAME: Brendan Edwards    MR#:  [id number]  DATE OF BIRTH:  1945-05-16  DATE OF ADMISSION:  07/28/2017 ADMITTING PHYSICIAN: Nicholes Mango, MD  DATE OF DISCHARGE: 07/29/17  PRIMARY CARE PHYSICIAN: Tracie Harrier, MD    ADMISSION DIAGNOSIS:  Atrial fibrillation with RVR ([place]) [I48.91] Acute on chronic congestive heart failure, unspecified heart failure type ([place]) [I50.9]  DISCHARGE DIAGNOSIS:  Active Problems:   Atrial fibrillation with RVR ([place])   SECONDARY DIAGNOSIS:   Past Medical History:  Diagnosis Date  . A-fib ([place])   . Benign prostatic hyperplasia   . Cardiomyopathy, secondary ([place])   . GERD (gastroesophageal reflux disease)   . Gout   . Hyperlipidemia   . Hypertension   . Obesity   . Sleep apnea     HOSPITAL COURSE:  hpi  Brendan Edwards  is a 72 y.o. male with a known history of chronic atrial fibrillation on amiodarone, digoxin, Cardizem, Eliquis, history of cardiomyopathy GERD, gout, hyperlipidemia, hypertension and other medical problems is presenting to the ED with a chief complaint of shortness of breath associated with her palpitations.  Patient's heart rate was found to be at 150s-160s and Dr. Carrolyn Meiers office who has sent this patient to the emergency department.  Patient was given Cardizem boluses with no significant improvement.  Patient is started on Cardizem drip.  Hospitalist team is called to admit the patient.  Patient's BNP is elevated, chest x-ray with small left-sided pleural effusion.  Patient sees Dr. Saralyn Pilar as an outpatient   #Atrial fibrillation with RVR  telemetry, monitor patient on telemetry Cardizem drip and titrate as needed, heart rate well controlled Resumed home dose Cardizem which was discontinued by the patient.  Continue amiodarone 400 mg p.o. twice a day and digoxin which are his home medication Continue Eliquis home medication for anticoagulation Consult placed and  discussed with Dr. Ubaldo Glassing.  Okay to discharge patient with current regimen and outpatient follow-up with Dr. Ubaldo Glassing in 2 weeks Reiforced the importance of being compliant with the medications  #Acute on chronic diastolic CHF Lasix 40 mg IV every 12 hours and potassium supplementation, clinically improved change to p.o. Lasix Repeat echocardiogram done results are pending.  Cardiology to follow-up on the results ,previous echocardiogram in 2018 has revealed 55-60% ejection fraction Monitor daily weight and intake and output  outpatient CHF clinic follow-up  #Elevated troponin could be from demand ischemia from atrial fibrillation with RVR and acute diastolic CHF Non-trending troponins  #Diabetes mellitu Hemoglobin A1c 5.3 sliding scale insulin  #Essential hypertension continue home medication lisinopril, Cardizem CD   #GERD ppi  #Tobacco abuse disorder Counseled patient to quit smoking for 5 minutes.  Patient verbalized understanding but refusing nicotine patch     DISCHARGE CONDITIONS:   STABLE  CONSULTS OBTAINED:  Treatment Team:  Teodoro Spray, MD Corey Skains, MD   PROCEDURES  None   DRUG ALLERGIES:   Allergies  Allergen Reactions  . Oxycodone Other (See Comments)    Hallucinations  . Cardura [Doxazosin Mesylate]   . Darvon [Propoxyphene]   . Penicillins     Has patient had a PCN reaction causing immediate rash, facial/tongue/throat swelling, SOB or lightheadedness with hypotension: Yes Has patient had a PCN reaction causing severe rash involving mucus membranes or skin necrosis: No Has patient had a PCN reaction that required hospitalization: No Has patient had a PCN reaction occurring within the last 10 years: No If all  of the above answers are "NO", then may proceed with Cephalosporin use.  Marland Kitchen Requip [Ropinirole Hcl]   . Septra [Sulfamethoxazole-Trimethoprim]     DISCHARGE MEDICATIONS:   Allergies as of 07/29/2017      Reactions   Oxycodone  Other (See Comments)   Hallucinations   Cardura [doxazosin Mesylate]    Darvon [propoxyphene]    Penicillins    Has patient had a PCN reaction causing immediate rash, facial/tongue/throat swelling, SOB or lightheadedness with hypotension: Yes Has patient had a PCN reaction causing severe rash involving mucus membranes or skin necrosis: No Has patient had a PCN reaction that required hospitalization: No Has patient had a PCN reaction occurring within the last 10 years: No If all of the above answers are "NO", then may proceed with Cephalosporin use.   Requip [ropinirole Hcl]    Septra [sulfamethoxazole-trimethoprim]       Medication List    TAKE these medications   acetaminophen 325 MG tablet Commonly known as:  TYLENOL Take 2 tablets (650 mg total) by mouth every 6 (six) hours as needed for mild pain (or Fever >/= 101).   allopurinol 300 MG tablet Commonly known as:  ZYLOPRIM Take 300 mg by mouth daily.   amiodarone 200 MG tablet Commonly known as:  PACERONE Take 1 tablet (200 mg total) by mouth 2 (two) times daily. What changed:    medication strength  how much to take  when to take this   apixaban 5 MG Tabs tablet Commonly known as:  ELIQUIS Take 5 mg by mouth 2 (two) times daily.   benzocaine 10 % mucosal gel Commonly known as:  ORAJEL Use as directed in the mouth or throat 4 (four) times daily as needed for mouth pain.   digoxin 0.125 MG tablet Commonly known as:  LANOXIN Take 1 tablet (125 mcg total) by mouth daily.   diltiazem 360 MG 24 hr capsule Commonly known as:  CARDIZEM CD Take 1 capsule (360 mg total) by mouth daily.   folic acid 1 MG tablet Commonly known as:  FOLVITE Take 1 tablet (1 mg total) by mouth daily.   furosemide 40 MG tablet Commonly known as:  LASIX Take 40 mg by mouth daily.   lisinopril 5 MG tablet Commonly known as:  PRINIVIL,ZESTRIL Take 5 mg by mouth daily.   oxyCODONE-acetaminophen 5-325 MG tablet Commonly known as:   PERCOCET/ROXICET Take 1 tablet by mouth every 6 (six) hours as needed for moderate pain.   potassium chloride SA 20 MEQ tablet Commonly known as:  K-DUR,KLOR-CON Take 20 mEq by mouth 2 (two) times daily.   thiamine 50 MG tablet Take 1 tablet (50 mg total) by mouth daily.   traMADol 50 MG tablet Commonly known as:  ULTRAM Take 50 mg by mouth at bedtime.        DISCHARGE INSTRUCTIONS:   Follow up with primary care physician 1 week and follow-up with cardiology Dr. Ubaldo Glassing in 1 to 2 weeks  DIET:  Cardiac diet and Diabetic diet  DISCHARGE CONDITION:  Stable  ACTIVITY:  Activity as tolerated  OXYGEN:  Home Oxygen: No.   Oxygen Delivery: room air  DISCHARGE LOCATION:  home   If you experience worsening of your admission symptoms, develop shortness of breath, life threatening emergency, suicidal or homicidal thoughts you must seek medical attention immediately by calling 911 or calling your MD immediately  if symptoms less severe.  You Must read complete instructions/literature along with all the possible adverse reactions/side effects for  all the Medicines you take and that have been prescribed to you. Take any new Medicines after you have completely understood and accpet all the possible adverse reactions/side effects.   Please note  You were cared for by a hospitalist during your hospital stay. If you have any questions about your discharge medications or the care you received while you were in the hospital after you are discharged, you can call the unit and asked to speak with the hospitalist on call if the hospitalist that took care of you is not available. Once you are discharged, your primary care physician will handle any further medical issues. Please note that NO REFILLS for any discharge medications will be authorized once you are discharged, as it is imperative that you return to your primary care physician (or establish a relationship with a primary care physician if  you do not have one) for your aftercare needs so that they can reassess your need for medications and monitor your lab values.     Today  Chief Complaint  Patient presents with  . Atrial Fibrillation   Pt is resting fine HR. denies any shortness of breath.  Okay to discharge patient from cardiology standpoint.  Heart rate is well controlled  ROS:  CONSTITUTIONAL: Denies fevers, chills. Denies any fatigue, weakness.  EYES: Denies blurry vision, double vision, eye pain. EARS, NOSE, THROAT: Denies tinnitus, ear pain, hearing loss. RESPIRATORY: Denies cough, wheeze, shortness of breath.  CARDIOVASCULAR: Denies chest pain, palpitations, edema.  GASTROINTESTINAL: Denies nausea, vomiting, diarrhea, abdominal pain. Denies bright red blood per rectum. GENITOURINARY: Denies dysuria, hematuria. ENDOCRINE: Denies nocturia or thyroid problems. HEMATOLOGIC AND LYMPHATIC: Denies easy bruising or bleeding. SKIN: Denies rash or lesion. MUSCULOSKELETAL: Denies pain in neck, back, shoulder, knees, hips or arthritic symptoms.  NEUROLOGIC: Denies paralysis, paresthesias.  PSYCHIATRIC: Denies anxiety or depressive symptoms.   VITAL SIGNS:  Blood pressure 119/77, pulse 89, temperature 98.3 F (36.8 C), temperature source Oral, resp. rate 16, height 5\' 8"  (1.727 m), weight 95.6 kg (210 lb 11.2 oz), SpO2 96 %.  I/O:    Intake/Output Summary (Last 24 hours) at 07/29/2017 1358 Last data filed at 07/29/2017 1347 Gross per 24 hour  Intake 50.34 ml  Output 2350 ml  Net -2299.66 ml    PHYSICAL EXAMINATION:  GENERAL:  72 y.o.-year-old patient lying in the bed with no acute distress.  EYES: Pupils equal, round, reactive to light and accommodation. No scleral icterus. Extraocular muscles intact.  HEENT: Head atraumatic, normocephalic. Oropharynx and nasopharynx clear.  NECK:  Supple, no jugular venous distention. No thyroid enlargement, no tenderness.  LUNGS: Normal breath sounds bilaterally, no  wheezing, rales,rhonchi or crepitation. No use of accessory muscles of respiration.  CARDIOVASCULAR: Irregularly irregular no murmurs, rubs, or gallops.  ABDOMEN: Soft, non-tender, non-distended. Bowel sounds present. No organomegaly or mass.  EXTREMITIES: No pedal edema, cyanosis, or clubbing.  NEUROLOGIC: Cranial nerves II through XII are intact. Muscle strength 5/5 in all extremities. Sensation intact. Gait not checked.  PSYCHIATRIC: The patient is alert and oriented x 3.  SKIN: No obvious rash, lesion, or ulcer.   DATA REVIEW:   CBC Recent Labs  Lab 07/29/17 0140  WBC 4.7  HGB 15.2  HCT 44.6  PLT 180    Chemistries  Recent Labs  Lab 07/29/17 0140  NA 136  K 3.4*  CL 101  CO2 28  GLUCOSE 107*  BUN 14  CREATININE 0.95  CALCIUM 7.9*    Cardiac Enzymes Recent Labs  Lab 07/29/17  [place] 0.09*    Microbiology Results  Results for orders placed or performed during the hospital encounter of 10/07/16  MRSA PCR Screening     Status: None   Collection Time: 10/07/16  6:42 PM  Result Value Ref Range Status   MRSA by PCR NEGATIVE NEGATIVE Final    Comment:        The GeneXpert MRSA Assay (FDA approved for NASAL specimens only), is one component of a comprehensive MRSA colonization surveillance program. It is not intended to diagnose MRSA infection nor to guide or monitor treatment for MRSA infections.     RADIOLOGY:  Dg Chest 2 View  Result Date: 07/28/2017 CLINICAL DATA:  Shortness of breath. EXAM: CHEST - 2 VIEW COMPARISON:  Radiographs of May 10, 2017. FINDINGS: Stable cardiomegaly with central pulmonary vascular congestion. Atherosclerosis of thoracic aorta is noted. No pneumothorax is noted. Small left pleural effusion is noted. Mild bibasilar subsegmental atelectasis is noted. Stable old lower thoracic compression fracture is noted. IMPRESSION: Mild bibasilar subsegmental atelectasis. Small left pleural effusion is noted. Aortic Atherosclerosis  (ICD10-I70.0). Electronically Signed   By: Marijo Conception, M.D.   On: 07/28/2017 16:37    EKG:   Orders placed or performed during the hospital encounter of 07/28/17  . ED EKG within 10 minutes  . ED EKG within 10 minutes  . EKG 12-Lead  . EKG 12-Lead      Management plans discussed with the patient, family and they are in agreement.  CODE STATUS:     Code Status Orders  (From admission, onward)        Start     Ordered   07/28/17 1954  Full code  Continuous     07/28/17 1953    Code Status History    Date Active Date Inactive Code Status Order ID Comments User Context   05/11/2017 0119 05/13/2017 1919 Full Code [id number]  Lance Coon, MD Inpatient   10/07/2016 1640 10/12/2016 1909 Full Code [id number]  Vaughan Basta, MD ED   08/10/2016 2017 08/20/2016 1517 Full Code [id number]  Theodoro Grist, MD ED      TOTAL TIME TAKING CARE OF THIS PATIENT: 35minutes.   Note: This dictation was prepared with Dragon dictation along with smaller phrase technology. Any transcriptional errors that result from this process are unintentional.   @[facility] @  on 07/29/2017 at 1:58 PM  Between 7am to 6pm - Pager - [phone]  After 6pm go to [email or web address] - password [email or web address] [facility]  Office  [phone]  CC: Primary care physician; Tracie Harrier, MD

## 2017-07-29 NOTE — Plan of Care (Signed)
  Problem: Clinical Measurements: Goal: Will remain free from infection Outcome: Progressing Goal: Cardiovascular complication will be avoided Outcome: Progressing   Problem: Activity: Goal: Risk for activity intolerance will decrease Outcome: Progressing   Problem: Pain Managment: Goal: General experience of comfort will improve Outcome: Progressing   Problem: Safety: Goal: Ability to remain free from injury will improve Outcome: Progressing

## 2017-07-29 NOTE — Progress Notes (Signed)
Pt d/c to home today.  IV removed intact.  D/c paperwork printed and reviewed w/pt.  All medication questions and concerns reviewed and pt states understanding.  All Rx's given to patient. Pt requested to walk out for d/c.     

## 2017-07-29 NOTE — Progress Notes (Signed)
Pt refused bed alarm but was educated about safety.. Will continue to monitor. 

## 2017-07-29 NOTE — Progress Notes (Signed)
Pt oxygen was at 89 at 7:50 AM and was SOB. Pt was place on 2 liter oxygen and oxygen went up to 95. Will continue to monitor.

## 2017-07-29 NOTE — Progress Notes (Signed)
*  PRELIMINARY RESULTS* Echocardiogram 2D Echocardiogram has been performed.  Brendan Edwards 07/29/2017, 10:29 AM

## 2017-07-29 NOTE — Progress Notes (Signed)
Pt's BP at 0100 73/47 and HR in the 70s-100s. Cardizem gtt turned off and MD Willis notified. No new orders at this time. Will recheck BP in 30 minutes. Earleen Reaper, RN

## 2017-07-29 NOTE — Progress Notes (Addendum)
SATURATION QUALIFICATIONS: (This note is used to comply with regulatory documentation for home oxygen)  Patient Saturations on Room Air at Rest = 94%  Patient Saturations on Room Air while Ambulating = 89%  Patient Saturations on0Liters of oxygen while Ambulating = 88-91%  Please briefly explain why patient needs home oxygen: Does not qualify

## 2017-07-29 NOTE — Discharge Instructions (Signed)
Follow up with primary care physician 1 week and follow-up with cardiology Dr. Ubaldo Glassing in 1 to 2 weeks

## 2017-08-18 IMAGING — CR DG LUMBAR SPINE 2-3V
3 series · 3 of 3 positions shown · non-contrast
Comparison: Coronal and sagittal reconstructed images from an
abdominal and pelvic CT scan dated August 10, 2016

CLINICAL DATA: Status post fall this morning. Persistent low back
pain.

EXAM:
LUMBAR SPINE - 2-3 VIEW

[l-spine ap]
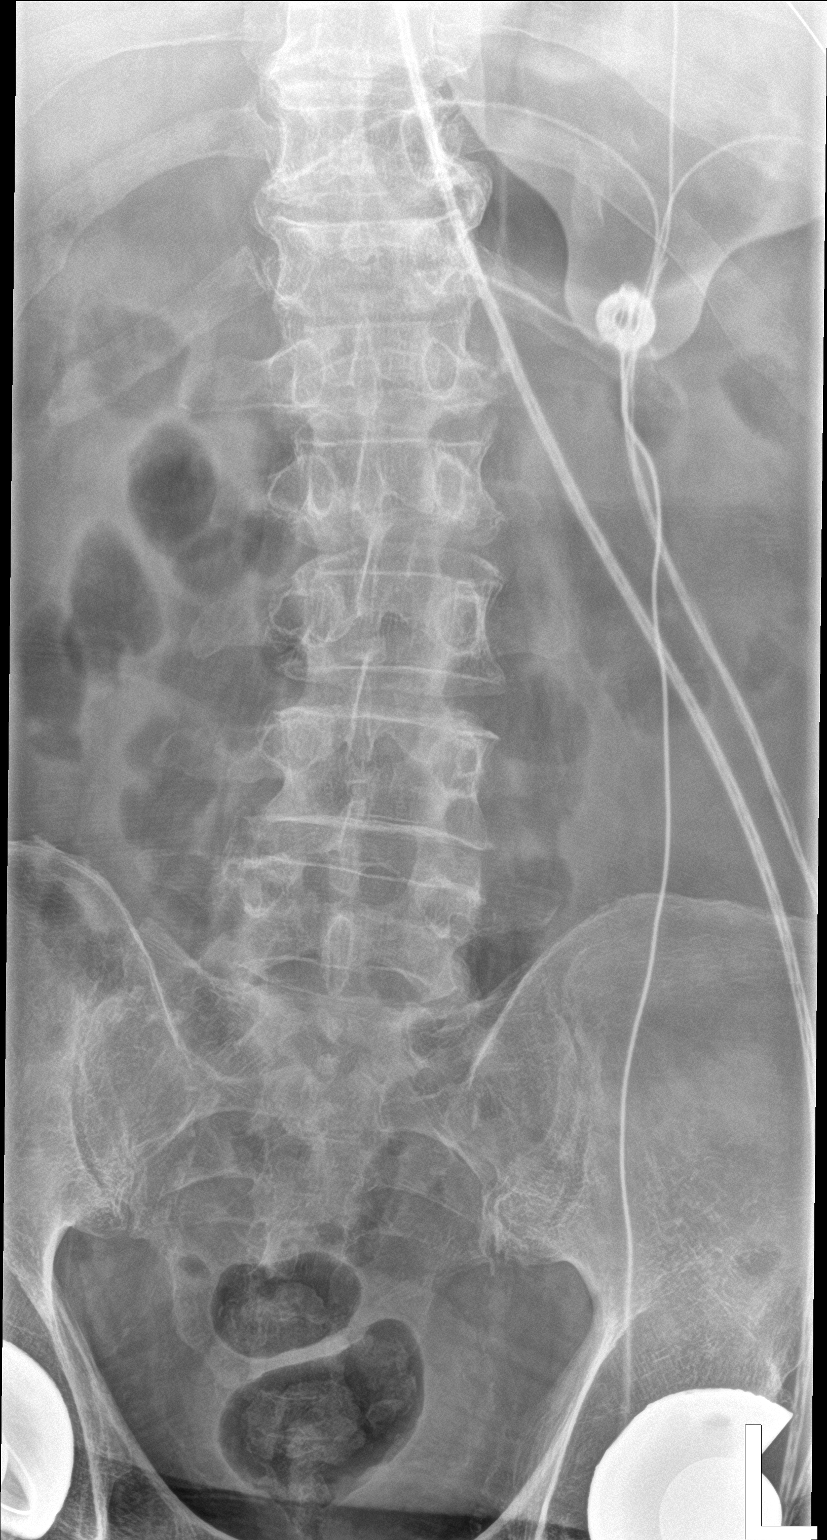

[l-spine lat (1 of 2)]
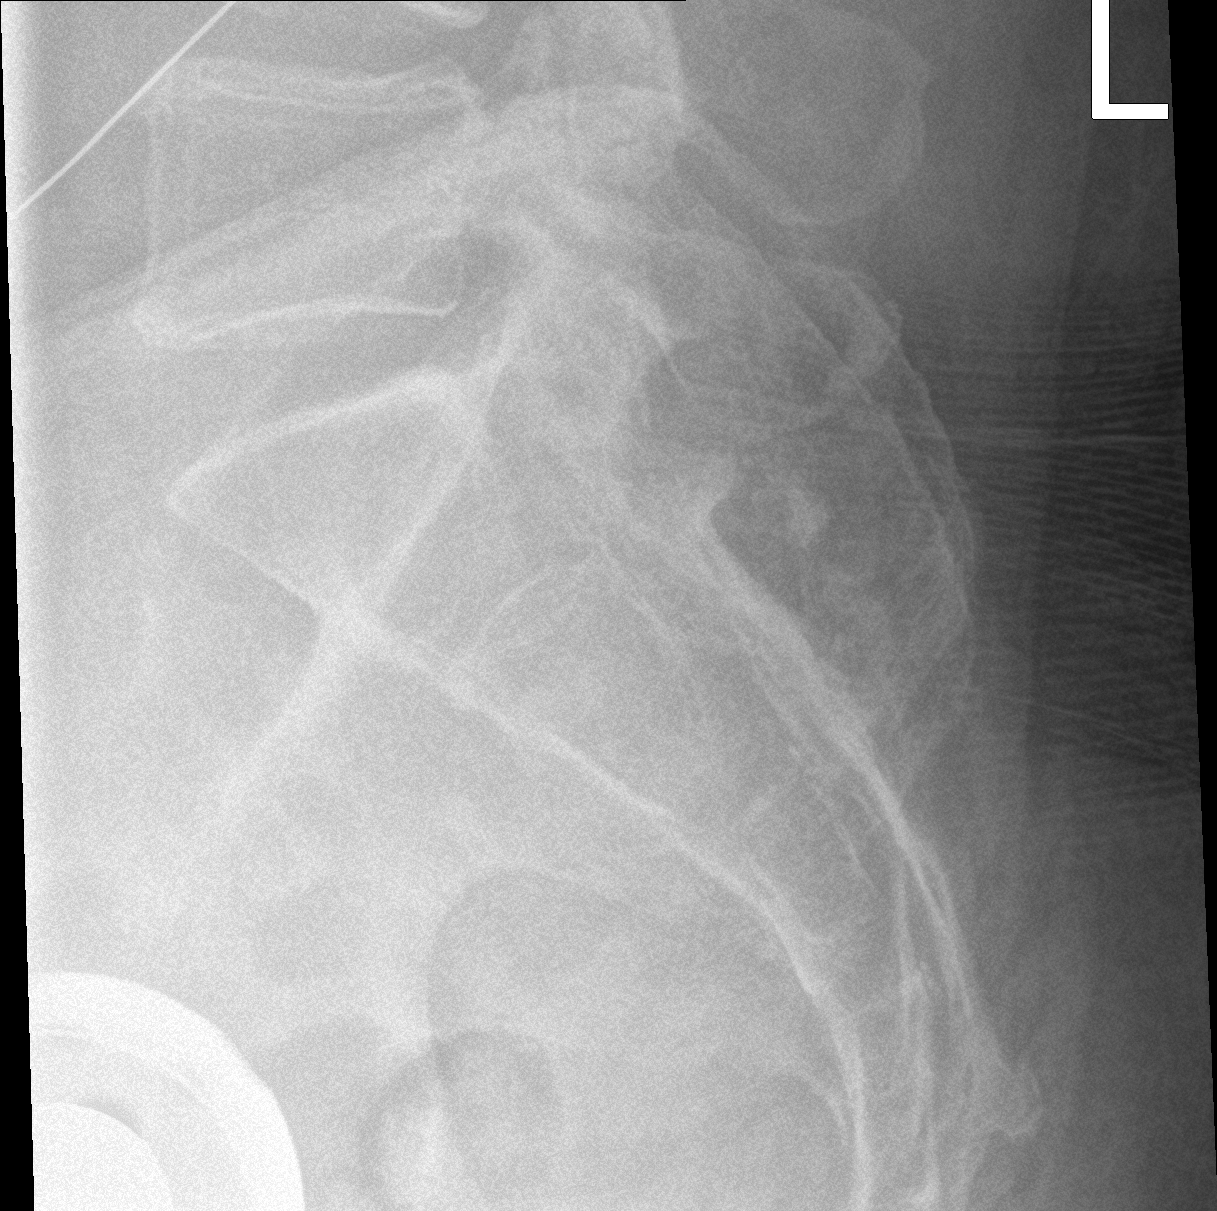

[l-spine lat (2 of 2)]
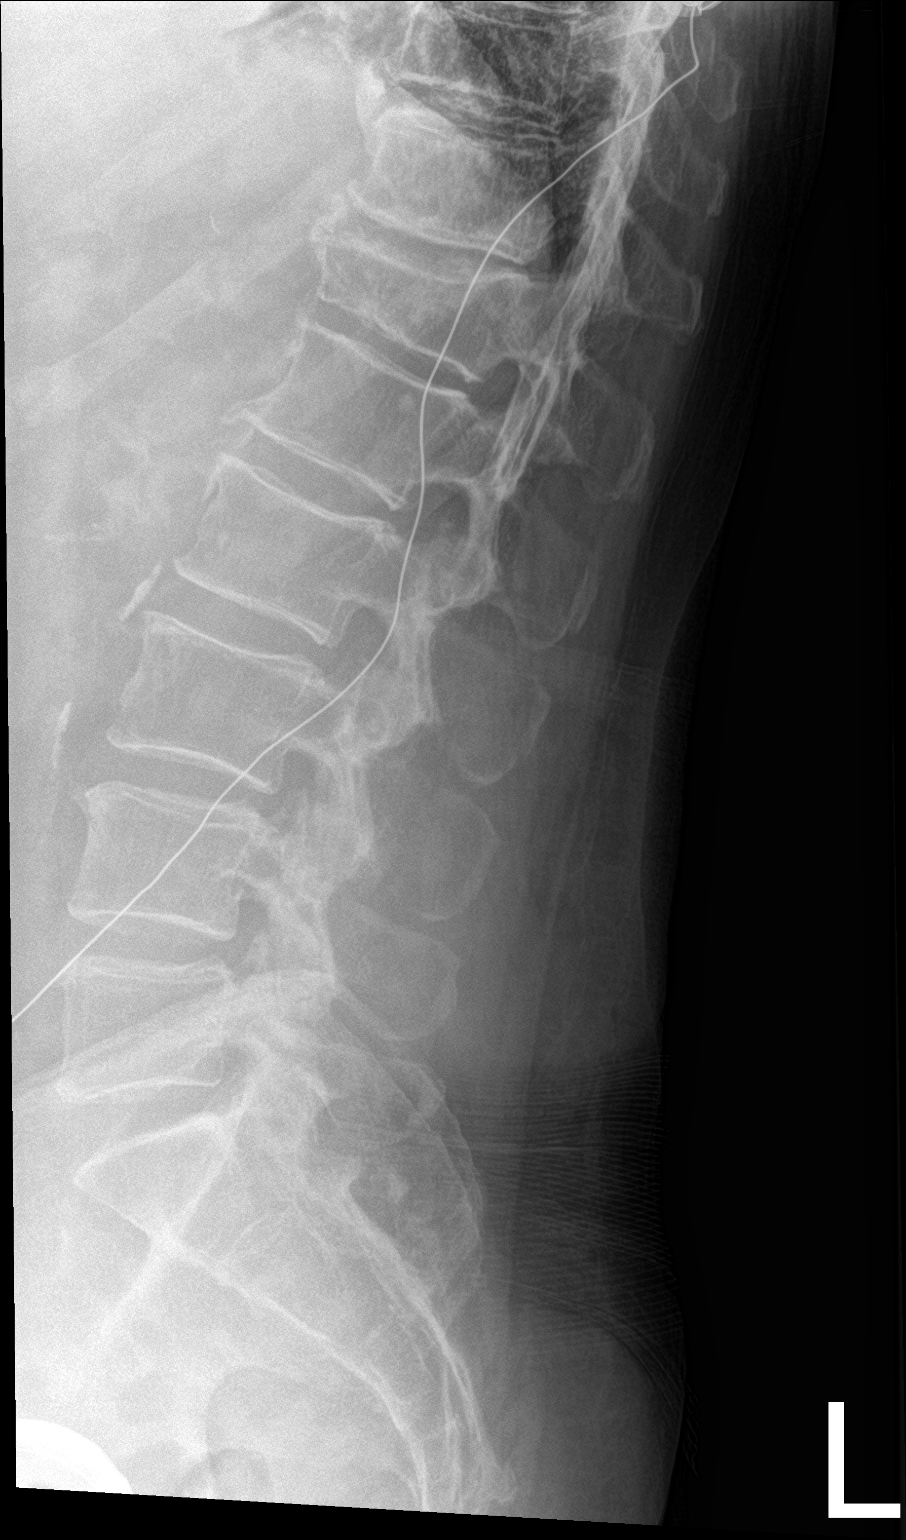

[3 of 3 positions shown; findings below may reference images not displayed]

FINDINGS: There is known compression of the body of T12. The loss of height
anteriorly is approximately 50% and posteriorly approximately 15%.
This has progressed slightly since the previous study. The lumbar
vertebral bodies are preserved in height. The disc space heights are
reasonably well-maintained. There is facet joint hypertrophy from
L3-4 through L5-S1. The pedicles and transverse processes are
intact. There is gentle dextrocurvature centered at L3 which is
stable.
IMPRESSION: No acute lumbar spine abnormality. Slight further compression of the
body of T12 as described above.

## 2017-11-28 ENCOUNTER — Emergency Department: Payer: PPO

## 2017-11-28 ENCOUNTER — Inpatient Hospital Stay
Admission: EM | Admit: 2017-11-28 | Discharge: 2017-12-07 | DRG: 871 | Disposition: A | Discharge status: Skilled Nursing Facility | Payer: PPO | Attending: Internal Medicine | Admitting: Internal Medicine

## 2017-11-28 DIAGNOSIS — W19XXXA Unspecified fall, initial encounter: Secondary | ICD-10-CM | POA: Diagnosis present

## 2017-11-28 DIAGNOSIS — I5042 Chronic combined systolic (congestive) and diastolic (congestive) heart failure: Secondary | ICD-10-CM | POA: Diagnosis present

## 2017-11-28 DIAGNOSIS — Z96643 Presence of artificial hip joint, bilateral: Secondary | ICD-10-CM | POA: Diagnosis not present

## 2017-11-28 DIAGNOSIS — I110 Hypertensive heart disease with heart failure: Secondary | ICD-10-CM | POA: Diagnosis not present

## 2017-11-28 DIAGNOSIS — E669 Obesity, unspecified: Secondary | ICD-10-CM | POA: Diagnosis not present

## 2017-11-28 DIAGNOSIS — Z7901 Long term (current) use of anticoagulants: Secondary | ICD-10-CM | POA: Diagnosis not present

## 2017-11-28 DIAGNOSIS — I10 Essential (primary) hypertension: Secondary | ICD-10-CM | POA: Diagnosis not present

## 2017-11-28 DIAGNOSIS — I4891 Unspecified atrial fibrillation: Secondary | ICD-10-CM | POA: Diagnosis not present

## 2017-11-28 DIAGNOSIS — F17290 Nicotine dependence, other tobacco product, uncomplicated: Secondary | ICD-10-CM | POA: Diagnosis not present

## 2017-11-28 DIAGNOSIS — E119 Type 2 diabetes mellitus without complications: Secondary | ICD-10-CM | POA: Diagnosis present

## 2017-11-28 DIAGNOSIS — S20211A Contusion of right front wall of thorax, initial encounter: Secondary | ICD-10-CM | POA: Diagnosis present

## 2017-11-28 DIAGNOSIS — F10129 Alcohol abuse with intoxication, unspecified: Secondary | ICD-10-CM | POA: Diagnosis present

## 2017-11-28 DIAGNOSIS — J90 Pleural effusion, not elsewhere classified: Secondary | ICD-10-CM | POA: Diagnosis not present

## 2017-11-28 DIAGNOSIS — R079 Chest pain, unspecified: Secondary | ICD-10-CM | POA: Diagnosis not present

## 2017-11-28 DIAGNOSIS — M1000 Idiopathic gout, unspecified site: Secondary | ICD-10-CM | POA: Diagnosis not present

## 2017-11-28 DIAGNOSIS — E785 Hyperlipidemia, unspecified: Secondary | ICD-10-CM | POA: Diagnosis not present

## 2017-11-28 DIAGNOSIS — I482 Chronic atrial fibrillation: Secondary | ICD-10-CM | POA: Diagnosis present

## 2017-11-28 DIAGNOSIS — J9690 Respiratory failure, unspecified, unspecified whether with hypoxia or hypercapnia: Secondary | ICD-10-CM | POA: Diagnosis not present

## 2017-11-28 DIAGNOSIS — M545 Low back pain: Secondary | ICD-10-CM | POA: Diagnosis not present

## 2017-11-28 DIAGNOSIS — I071 Rheumatic tricuspid insufficiency: Secondary | ICD-10-CM | POA: Diagnosis present

## 2017-11-28 DIAGNOSIS — M546 Pain in thoracic spine: Secondary | ICD-10-CM | POA: Diagnosis not present

## 2017-11-28 DIAGNOSIS — J189 Pneumonia, unspecified organism: Secondary | ICD-10-CM

## 2017-11-28 DIAGNOSIS — Z6831 Body mass index (BMI) 31.0-31.9, adult: Secondary | ICD-10-CM

## 2017-11-28 DIAGNOSIS — A419 Sepsis, unspecified organism: Secondary | ICD-10-CM | POA: Diagnosis not present

## 2017-11-28 DIAGNOSIS — J159 Unspecified bacterial pneumonia: Secondary | ICD-10-CM | POA: Diagnosis not present

## 2017-11-28 DIAGNOSIS — Z881 Allergy status to other antibiotic agents status: Secondary | ICD-10-CM

## 2017-11-28 DIAGNOSIS — I878 Other specified disorders of veins: Secondary | ICD-10-CM | POA: Diagnosis present

## 2017-11-28 DIAGNOSIS — R55 Syncope and collapse: Secondary | ICD-10-CM | POA: Diagnosis not present

## 2017-11-28 DIAGNOSIS — Z880 Allergy status to penicillin: Secondary | ICD-10-CM | POA: Diagnosis not present

## 2017-11-28 DIAGNOSIS — K219 Gastro-esophageal reflux disease without esophagitis: Secondary | ICD-10-CM | POA: Diagnosis present

## 2017-11-28 DIAGNOSIS — Z885 Allergy status to narcotic agent status: Secondary | ICD-10-CM

## 2017-11-28 DIAGNOSIS — Z9911 Dependence on respirator [ventilator] status: Secondary | ICD-10-CM | POA: Diagnosis not present

## 2017-11-28 DIAGNOSIS — J9601 Acute respiratory failure with hypoxia: Secondary | ICD-10-CM | POA: Diagnosis not present

## 2017-11-28 DIAGNOSIS — M109 Gout, unspecified: Secondary | ICD-10-CM | POA: Diagnosis present

## 2017-11-28 DIAGNOSIS — I429 Cardiomyopathy, unspecified: Secondary | ICD-10-CM | POA: Diagnosis not present

## 2017-11-28 DIAGNOSIS — G4733 Obstructive sleep apnea (adult) (pediatric): Secondary | ICD-10-CM | POA: Diagnosis present

## 2017-11-28 DIAGNOSIS — M549 Dorsalgia, unspecified: Secondary | ICD-10-CM

## 2017-11-28 DIAGNOSIS — E1165 Type 2 diabetes mellitus with hyperglycemia: Secondary | ICD-10-CM | POA: Diagnosis present

## 2017-11-28 DIAGNOSIS — N400 Enlarged prostate without lower urinary tract symptoms: Secondary | ICD-10-CM | POA: Diagnosis not present

## 2017-11-28 DIAGNOSIS — R0602 Shortness of breath: Secondary | ICD-10-CM | POA: Diagnosis not present

## 2017-11-28 DIAGNOSIS — Z888 Allergy status to other drugs, medicaments and biological substances status: Secondary | ICD-10-CM

## 2017-11-28 DIAGNOSIS — R17 Unspecified jaundice: Secondary | ICD-10-CM | POA: Diagnosis not present

## 2017-11-28 DIAGNOSIS — F419 Anxiety disorder, unspecified: Secondary | ICD-10-CM | POA: Diagnosis present

## 2017-11-28 DIAGNOSIS — Z8249 Family history of ischemic heart disease and other diseases of the circulatory system: Secondary | ICD-10-CM

## 2017-11-28 DIAGNOSIS — D529 Folate deficiency anemia, unspecified: Secondary | ICD-10-CM | POA: Diagnosis not present

## 2017-11-28 DIAGNOSIS — M4854XA Collapsed vertebra, not elsewhere classified, thoracic region, initial encounter for fracture: Secondary | ICD-10-CM | POA: Diagnosis not present

## 2017-11-28 DIAGNOSIS — R52 Pain, unspecified: Secondary | ICD-10-CM

## 2017-11-28 DIAGNOSIS — E876 Hypokalemia: Secondary | ICD-10-CM | POA: Diagnosis present

## 2017-11-28 DIAGNOSIS — G4730 Sleep apnea, unspecified: Secondary | ICD-10-CM | POA: Diagnosis not present

## 2017-11-28 DIAGNOSIS — K5909 Other constipation: Secondary | ICD-10-CM | POA: Diagnosis present

## 2017-11-28 DIAGNOSIS — H9190 Unspecified hearing loss, unspecified ear: Secondary | ICD-10-CM | POA: Diagnosis present

## 2017-11-28 DIAGNOSIS — S299XXA Unspecified injury of thorax, initial encounter: Secondary | ICD-10-CM | POA: Diagnosis not present

## 2017-11-28 DIAGNOSIS — R6520 Severe sepsis without septic shock: Secondary | ICD-10-CM | POA: Diagnosis not present

## 2017-11-28 DIAGNOSIS — R05 Cough: Secondary | ICD-10-CM | POA: Diagnosis not present

## 2017-11-28 LAB — CBC WITH DIFFERENTIAL/PLATELET
Basophils Absolute: 0.1 10*3/uL (ref 0–0.1)
Basophils Relative: 1 %
Eosinophils Absolute: 0 10*3/uL (ref 0–0.7)
Eosinophils Relative: 0 %
HEMATOCRIT: 49.5 % (ref 40.0–52.0)
HEMOGLOBIN: 16.6 g/dL (ref 13.0–18.0)
LYMPHS PCT: 3 %
Lymphs Abs: 0.5 10*3/uL — ABNORMAL LOW (ref 1.0–3.6)
MCH: 35.5 pg — ABNORMAL HIGH (ref 26.0–34.0)
MCHC: 33.5 g/dL (ref 32.0–36.0)
MCV: 106 fL — AB (ref 80.0–100.0)
MONOS PCT: 7 %
Monocytes Absolute: 0.9 10*3/uL (ref 0.2–1.0)
NEUTROS ABS: 12.1 10*3/uL — AB (ref 1.4–6.5)
NEUTROS PCT: 89 %
Platelets: 230 10*3/uL (ref 150–440)
RBC: 4.67 MIL/uL (ref 4.40–5.90)
RDW: 16 % — ABNORMAL HIGH (ref 11.5–14.5)
WBC: 13.7 10*3/uL — AB (ref 3.8–10.6)

## 2017-11-28 LAB — ETHANOL: Alcohol, Ethyl (B): 167 mg/dL — ABNORMAL HIGH (ref <10)

## 2017-11-28 LAB — BRAIN NATRIURETIC PEPTIDE: B Natriuretic Peptide: 250 pg/mL — ABNORMAL HIGH (ref 0.0–100.0)

## 2017-11-28 LAB — PROTIME-INR
INR: 1.36
PROTHROMBIN TIME: 16.7 s — AB (ref 11.4–15.2)

## 2017-11-28 LAB — LACTIC ACID, PLASMA: Lactic Acid, Venous: 3.1 mmol/L (ref 0.5–1.9)

## 2017-11-28 MED ORDER — AMIODARONE HCL IN DEXTROSE 360-4.14 MG/200ML-% IV SOLN
60.0000 mg/h | INTRAVENOUS | Status: DC
Start: 2017-11-29 — End: 2017-11-29
  Administered 2017-11-29 (×2): 60 mg/h via INTRAVENOUS
  Filled 2017-11-28: qty 200

## 2017-11-28 MED ORDER — AMIODARONE LOAD VIA INFUSION
150.0000 mg | Freq: Once | INTRAVENOUS | Status: AC
  Administered 2017-11-29: 150 mg via INTRAVENOUS
  Filled 2017-11-28: qty 83.34

## 2017-11-28 MED ORDER — MORPHINE SULFATE (PF) 4 MG/ML IV SOLN
4.0000 mg | Freq: Once | INTRAVENOUS | Status: AC
  Administered 2017-11-28: 4 mg via INTRAVENOUS
  Filled 2017-11-28: qty 1

## 2017-11-28 MED ORDER — LACTATED RINGERS IV BOLUS (SEPSIS)
2000.0000 mL | Freq: Once | INTRAVENOUS | Status: AC
  Administered 2017-11-28: 2000 mL via INTRAVENOUS

## 2017-11-28 MED ORDER — AMIODARONE HCL IN DEXTROSE 360-4.14 MG/200ML-% IV SOLN
30.0000 mg/h | INTRAVENOUS | Status: DC
Start: 2017-11-29 — End: 2017-11-30
  Administered 2017-11-29 – 2017-11-30 (×2): 30 mg/h via INTRAVENOUS
  Filled 2017-11-28 (×3): qty 200

## 2017-11-28 MED ORDER — APIXABAN 5 MG PO TABS
5.0000 mg | ORAL_TABLET | Freq: Two times a day (BID) | ORAL | Status: DC
  Administered 2017-11-29 – 2017-12-07 (×17): 5 mg via ORAL
  Filled 2017-11-28 (×17): qty 1

## 2017-11-28 MED ORDER — LORAZEPAM 2 MG/ML IJ SOLN
1.0000 mg | Freq: Once | INTRAMUSCULAR | Status: AC
  Administered 2017-11-28: 1 mg via INTRAVENOUS
  Filled 2017-11-28: qty 1

## 2017-11-28 MED ORDER — SODIUM CHLORIDE 0.9 % IV SOLN
500.0000 mg | INTRAVENOUS | Status: DC
Start: 2017-11-28 — End: 2017-11-29
  Administered 2017-11-28: 500 mg via INTRAVENOUS

## 2017-11-28 MED ORDER — ONDANSETRON HCL 4 MG/2ML IJ SOLN
4.0000 mg | INTRAMUSCULAR | Status: AC
  Administered 2017-11-28: 4 mg via INTRAVENOUS
  Filled 2017-11-28: qty 2

## 2017-11-28 MED ORDER — SODIUM CHLORIDE 0.9 % IV BOLUS
500.0000 mL | Freq: Once | INTRAVENOUS | Status: AC
  Administered 2017-11-28: 500 mL via INTRAVENOUS

## 2017-11-28 MED ORDER — SODIUM CHLORIDE 0.9 % IV SOLN
2.0000 g | INTRAVENOUS | Status: DC
  Administered 2017-11-28 – 2017-11-30 (×2): 2 g via INTRAVENOUS
  Filled 2017-11-28: qty 2
  Filled 2017-11-28 (×2): qty 20

## 2017-11-28 NOTE — ED Provider Notes (Signed)
[facility] Emergency Department Provider Note  ____________________________________________   None    (approximate)  I have reviewed the triage vital signs and the nursing notes.   HISTORY  Chief Complaint No chief complaint on file.  Level 5 caveat:  history/ROS limited by acute/critical illness  HPI Brendan Edwards is a 72 y.o. male who presents after a fall.  He reportedly blacked out at home and was unable to get up for about an hour and 1/2 to 2 hours.  He thinks he landed on his back and is having some severe point tenderness just below his right shoulder blade.  The pain is worse when he takes deep breaths or moves around.  He has no anterior chest pain.  He has having some shortness of breath but it may be related to the fall.  He takes Eliquis for chronic atrial fibrillation and currently his heart rate is in the 140s.  He did not admit it but his girlfriend told me in confidence that he drinks a large amount of vodka every day and she is concerned about his gradual worsening status over extended period of time.  He denies any recent illnesses and denies fever/chills, nausea or vomiting, and abdominal pain.  Nothing particular makes his symptoms better or worse and they are currently severe.  He was hypoxemic upon arrival and required oxygen by nasal cannula which is atypical for him.  Past Medical History:  Diagnosis Date  . A-fib ([place])   . Benign prostatic hyperplasia   . Cardiomyopathy, secondary ([place])   . GERD (gastroesophageal reflux disease)   . Gout   . Hyperlipidemia   . Hypertension   . Obesity   . Sleep apnea     Patient Active Problem List   Diagnosis Date Noted  . Acute hypoxemic respiratory failure ([place]) 11/28/2017  . HTN (hypertension) 05/10/2017  . HLD (hyperlipidemia) 05/10/2017  . GERD (gastroesophageal reflux disease) 05/10/2017  . Atrial fibrillation with RVR ([place]) 08/10/2016  . Diabetes ([place]) 08/10/2016  . Leukocytosis  08/10/2016  . Fall 08/10/2016  . Alcoholic hepatitis 78/29/5621  . Alcohol withdrawal ([place]) 08/10/2016  . Tobacco abuse counseling 08/10/2016    Past Surgical History:  Procedure Laterality Date  . APPENDECTOMY    . COLONOSCOPY    . COLONOSCOPY WITH PROPOFOL N/A 11/29/2014   Procedure: COLONOSCOPY WITH PROPOFOL;  Surgeon: Hulen Luster, MD;  Location: [facility] ENDOSCOPY;  Service: Gastroenterology;  Laterality: N/A;  . ESOPHAGOGASTRODUODENOSCOPY    . JOINT REPLACEMENT     right and left hip    Prior to Admission medications   Medication Sig Start Date End Date Taking? Authorizing Provider  acetaminophen (TYLENOL) 325 MG tablet Take 2 tablets (650 mg total) by mouth every 6 (six) hours as needed for mild pain (or Fever >/= 101). 05/13/17  Yes Dustin Flock, MD  allopurinol (ZYLOPRIM) 300 MG tablet Take 300 mg by mouth daily.   Yes [provider]  amiodarone (PACERONE) 200 MG tablet Take 1 tablet (200 mg total) by mouth 2 (two) times daily. 07/29/17  Yes Gouru, Illene Silver, MD  apixaban (ELIQUIS) 5 MG TABS tablet Take 5 mg by mouth 2 (two) times daily.   Yes [provider]  digoxin (LANOXIN) 0.125 MG tablet Take 1 tablet (125 mcg total) by mouth daily. 05/13/17 05/13/18 Yes Dustin Flock, MD  diltiazem (CARDIZEM CD) 360 MG 24 hr capsule Take 1 capsule (360 mg total) by mouth daily. 07/29/17  Yes Nicholes Mango, MD  folic acid (FOLVITE) 1 MG tablet Take 1 tablet (1 mg total) by mouth daily. 08/20/16  Yes Fritzi Mandes, MD  furosemide (LASIX) 40 MG tablet Take 40 mg by mouth daily.   Yes [provider]  lisinopril (PRINIVIL,ZESTRIL) 5 MG tablet Take 5 mg by mouth daily.   Yes [provider]  potassium chloride SA (K-DUR,KLOR-CON) 20 MEQ tablet Take 20 mEq by mouth 2 (two) times daily.   Yes [provider]  traMADol (ULTRAM) 50 MG tablet Take 50 mg by mouth at bedtime as needed.  04/28/17  Yes [provider]  benzocaine (ORAJEL) 10 % mucosal gel Use as  directed in the mouth or throat 4 (four) times daily as needed for mouth pain. Patient not taking: Reported on 05/10/2017 08/20/16   Fritzi Mandes, MD  oxyCODONE-acetaminophen (PERCOCET/ROXICET) 5-325 MG tablet Take 1 tablet by mouth every 6 (six) hours as needed for moderate pain. Patient not taking: Reported on 11/28/2017 05/13/17   Dustin Flock, MD  thiamine 50 MG tablet Take 1 tablet (50 mg total) by mouth daily. Patient not taking: Reported on 07/28/2017 08/20/16   Fritzi Mandes, MD    Allergies Oxycodone; Cardura [doxazosin mesylate]; Darvon [propoxyphene]; Penicillins; Requip [ropinirole hcl]; and Septra [sulfamethoxazole-trimethoprim]  Family History  Problem Relation Age of Onset  . Hypertension Mother     Social History Social History   Tobacco Use  . Smoking status: Current Every Day Smoker    Types: Cigars  . Smokeless tobacco: Never Used  Substance Use Topics  . Alcohol use: Yes  . Drug use: No    Review of Systems Constitutional: No fever/chills Eyes: No visual changes. ENT: No sore throat. Cardiovascular: Denies chest pain. Respiratory: Some shortness of breath. Gastrointestinal: No abdominal pain.  No nausea, no vomiting.  No diarrhea.  No constipation. Genitourinary: Negative for dysuria. Musculoskeletal: Posterior rib or back pain on the right side just below the right scapula.  Negative for neck pain.  Negative for back pain. Integumentary: Negative for rash. Neurological: Negative for headaches, focal weakness or numbness.   ____________________________________________   PHYSICAL EXAM:  VITAL SIGNS: ED Triage Vitals  Enc Vitals Group     BP 11/28/17 2120 (!) 134/94     Pulse Rate 11/28/17 2120 (!) 145     Resp 11/28/17 2120 (!) 21     Temp 11/28/17 2120 98.7 F (37.1 C)     Temp Source 11/28/17 2120 Oral     SpO2 11/28/17 2120 (!) 88 %     Weight 11/28/17 2121 93 kg (205 lb)     Height 11/28/17 2121 1.753 m (5\' 9" )     Head Circumference --       Peak Flow --      Pain Score 11/28/17 2121 8     Pain Loc --      Pain Edu? --      Excl. in [place]? --     Constitutional: Alert and oriented.  Appears chronically ill but is also having some acute respiratory distress secondary to pain Eyes: Conjunctivae are normal.  Head: Atraumatic. Nose: No congestion/rhinnorhea. Mouth/Throat: Mucous membranes are moist. Neck: No stridor.  No meningeal signs.   Cardiovascular: A. fib with RVR. Good peripheral circulation. Grossly normal heart sounds. Respiratory: Increased respiratory effort.  Mild retractions. Lungs CTAB. Gastrointestinal: Soft and nontender. No distention.  Musculoskeletal: Highly reproducible point tenderness to palpation below the right scapula.  No tenderness to palpation along the spine including C, T, and L  regions.  No step-offs or deformities.  No lower extremity tenderness nor edema. No gross deformities of extremities. Neurologic:  Normal speech and language. No gross focal neurologic deficits are appreciated.  Skin:  Skin is warm, dry and intact. No rash noted.   ____________________________________________   LABS (all labs ordered are listed, but only abnormal results are displayed)  Labs Reviewed  ETHANOL - Abnormal; Notable for the following components:      Result Value   Alcohol, Ethyl (B) 167 (*)    All other components within normal limits  BRAIN NATRIURETIC PEPTIDE - Abnormal; Notable for the following components:   B Natriuretic Peptide 250.0 (*)    All other components within normal limits  LACTIC ACID, PLASMA - Abnormal; Notable for the following components:   Lactic Acid, Venous 3.1 (*)    All other components within normal limits  CBC WITH DIFFERENTIAL/PLATELET - Abnormal; Notable for the following components:   WBC 13.7 (*)    MCV 106.0 (*)    MCH 35.5 (*)    RDW 16.0 (*)    Neutro Abs 12.1 (*)    Lymphs Abs 0.5 (*)    All other components within normal limits  PROTIME-INR - Abnormal; Notable  for the following components:   Prothrombin Time 16.7 (*)    All other components within normal limits  CK - Abnormal; Notable for the following components:   Total CK 44 (*)    All other components within normal limits  COMPREHENSIVE METABOLIC PANEL - Abnormal; Notable for the following components:   Potassium 3.4 (*)    Chloride 96 (*)    Glucose, Bld 104 (*)    Calcium 8.4 (*)    Alkaline Phosphatase 163 (*)    Total Bilirubin 1.7 (*)    Anion gap 18 (*)    All other components within normal limits  MAGNESIUM - Abnormal; Notable for the following components:   Magnesium 1.5 (*)    All other components within normal limits  CULTURE, BLOOD (ROUTINE X 2)  CULTURE, BLOOD (ROUTINE X 2)  URINE CULTURE  EXPECTORATED SPUTUM ASSESSMENT W REFEX TO RESP CULTURE  GRAM STAIN  LIPASE, BLOOD  TROPONIN I  PHOSPHORUS  LACTIC ACID, PLASMA  URINALYSIS, ROUTINE W REFLEX MICROSCOPIC  PROCALCITONIN  DIGOXIN LEVEL  CALCIUM, IONIZED  HIV ANTIBODY (ROUTINE TESTING)  STREP PNEUMONIAE URINARY ANTIGEN  LEGIONELLA PNEUMOPHILA SEROGP 1 UR AG   ____________________________________________  EKG  ED ECG REPORT I, Hinda Kehr, the attending physician, personally viewed and interpreted this ECG.  Date: 11/28/2017 EKG Time: 21: 24 Rate: 135 Rhythm: Atrial fibrillation QRS Axis: normal Intervals: Left posterior fascicular block ST/T Wave abnormalities: Non-specific ST segment / T-wave changes, but no evidence of acute ischemia. Narrative Interpretation: no evidence of acute ischemia   ____________________________________________  RADIOLOGY I, Hinda Kehr, personally viewed and evaluated these images (plain radiographs) as part of my medical decision making, as well as reviewing the written report by the radiologist.  ED MD interpretation: Concerning for left pleural effusion and probable pneumonia  Official radiology report(s): Dg Chest Portable 1 View  Result Date: 11/28/2017 CLINICAL  DATA:  Chest pain and syncope EXAM: PORTABLE CHEST 1 VIEW COMPARISON:  07/28/2017, 05/10/2017 FINDINGS: Low lung volumes. Right lung is clear. Moderate left pleural effusion. Cardiomegaly. Dense airspace disease at the lingula and left base. IMPRESSION: Moderate left pleural effusion with atelectasis or pneumonia at the lingula and left base. Cardiomegaly. Electronically Signed   By: Madie Reno.D.  On: 11/28/2017 22:02    ____________________________________________   PROCEDURES  Critical Care performed: Yes, see critical care procedure note(s)   Procedure(s) performed:   .Critical Care Performed by: Hinda Kehr, MD Authorized by: Hinda Kehr, MD   Critical care provider statement:    Critical care time (minutes):  30   Critical care time was exclusive of:  Separately billable procedures and treating other patients   Critical care was necessary to treat or prevent imminent or life-threatening deterioration of the following conditions:  Respiratory failure, sepsis and trauma   Critical care was time spent personally by me on the following activities:  Development of treatment plan with patient or surrogate, discussions with consultants, evaluation of patient's response to treatment, examination of patient, obtaining history from patient or surrogate, ordering and performing treatments and interventions, ordering and review of laboratory studies, ordering and review of radiographic studies, pulse oximetry, re-evaluation of patient's condition and review of old charts     ____________________________________________   INITIAL IMPRESSION / ASSESSMENT AND PLAN / ED COURSE  As part of my medical decision making, I reviewed the following data within the electronic MEDICAL RECORD NUMBER History obtained from family, Nursing notes reviewed and incorporated, Labs reviewed , EKG interpreted , Old chart reviewed, Radiograph reviewed , Discussed with admitting physician  and Notes from prior  ED visits    Differential diagnosis includes, but is not limited to, traumatic injury such as broken ribs, pneumothorax, hemothorax; pneumonia; aspiration pneumonia; rib contusion; syncope due to cardiogenic causes.  Alcohol intoxication is also contributing factor.  The patient's vital signs are notable for a heart rate in the 130s 140s.  I started 1 L of normal saline but then his lactic acid came back at 3.1.  The combination of his hypoxemia, lactic acid, tachycardia, and pleural effusion with probable pneumonia on chest x-ray because me to make him code sepsis with a total of 3 L of IV fluid (30 mils per kilo) and empiric antibiotics of ceftriaxone 2 g IV and azithromycin 500 mg IV.  Blood cultures were ordered as well.  My original plan was to obtain a CT scan of the chest to look for any evidence of traumatic injury such as broken ribs are not visualized on the radiograph, but the patient's lab work was taken extraordinarily long time to come back and due to an Firefighter.  I had already spoken with the hospitalist who admitted the patient for further management upstairs.  By the time the conference of metabolic panel came back showing good and normal kidney function, the patient is Artie been taken upstairs.  I will defer to the hospitalist for any additional imaging and further management.  The patient also received morphine 4 mg IV and Zofran 4 mg IV in addition to the medications and fluids listed above.   Clinical Course as of Nov 29 45  Sun Nov 28, 2017  2244 Lactic Acid, Venous(!!): 3.1 [CF]    Clinical Course User Index [CF] Hinda Kehr, MD    ____________________________________________  FINAL CLINICAL IMPRESSION(S) / ED DIAGNOSES  Final diagnoses:  Acute respiratory failure with hypoxemia ([place])  Community acquired pneumonia of left lung, unspecified part of lung  Pleural effusion  Contusion of rib on right side, initial encounter  Alcohol abuse with intoxication  ([place])  Sepsis, due to unspecified organism [facility])  Atrial fibrillation with RVR ([place])     MEDICATIONS GIVEN DURING THIS VISIT:  Medications  lactated ringers bolus 2,000 mL (2,000 mLs Intravenous  New Bag/Given 11/28/17 2300)  cefTRIAXone (ROCEPHIN) 2 g in sodium chloride 0.9 % 100 mL IVPB (2 g Intravenous New Bag/Given 11/28/17 2337)  senna-docusate (Senokot-S) tablet 1 tablet (has no administration in time range)  bisacodyl (DULCOLAX) EC tablet 5 mg (has no administration in time range)  ondansetron (ZOFRAN) tablet 4 mg (has no administration in time range)    Or  ondansetron (ZOFRAN) injection 4 mg (has no administration in time range)  azithromycin (ZITHROMAX) 500 mg in sodium chloride 0.9 % 250 mL IVPB (has no administration in time range)  LORazepam (ATIVAN) tablet 1 mg (has no administration in time range)    Or  LORazepam (ATIVAN) injection 1 mg (has no administration in time range)  thiamine (VITAMIN B-1) tablet 100 mg (has no administration in time range)    Or  thiamine (B-1) injection 100 mg (has no administration in time range)  folic acid (FOLVITE) tablet 1 mg (has no administration in time range)  multivitamin with minerals tablet 1 tablet (has no administration in time range)  LORazepam (ATIVAN) injection 0-4 mg (has no administration in time range)    Followed by  LORazepam (ATIVAN) injection 0-4 mg (has no administration in time range)  apixaban (ELIQUIS) tablet 5 mg (has no administration in time range)  amiodarone (NEXTERONE) 1.8 mg/mL load via infusion 150 mg (has no administration in time range)    Followed by  amiodarone (NEXTERONE PREMIX) 360-4.14 MG/200ML-% (1.8 mg/mL) IV infusion (has no administration in time range)    Followed by  amiodarone (NEXTERONE PREMIX) 360-4.14 MG/200ML-% (1.8 mg/mL) IV infusion (has no administration in time range)  metoprolol tartrate (LOPRESSOR) injection 5 mg (has no administration in time range)  HYDROmorphone (DILAUDID)  injection 0.5 mg (has no administration in time range)  insulin aspart (novoLOG) injection 0-9 Units (has no administration in time range)  insulin aspart (novoLOG) injection 0-5 Units (has no administration in time range)  digoxin (LANOXIN) tablet 125 mcg (has no administration in time range)  diltiazem (CARDIZEM CD) 24 hr capsule 360 mg (has no administration in time range)  ipratropium-albuterol (DUONEB) 0.5-2.5 (3) MG/3ML nebulizer solution 3 mL (has no administration in time range)  sodium chloride flush (NS) 0.9 % injection 3 mL (has no administration in time range)  potassium chloride SA (K-DUR,KLOR-CON) CR tablet 40 mEq (has no administration in time range)  allopurinol (ZYLOPRIM) tablet 300 mg (has no administration in time range)  furosemide (LASIX) tablet 40 mg (has no administration in time range)  lisinopril (PRINIVIL,ZESTRIL) tablet 5 mg (has no administration in time range)  magnesium sulfate IVPB 2 g 50 mL (has no administration in time range)  sodium chloride 0.9 % bolus 500 mL (0 mLs Intravenous Stopped 11/28/17 2314)  morphine 4 MG/ML injection 4 mg (4 mg Intravenous Given 11/28/17 2207)  ondansetron (ZOFRAN) injection 4 mg (4 mg Intravenous Given 11/28/17 2207)  LORazepam (ATIVAN) injection 1 mg (1 mg Intravenous Given 11/28/17 2211)     ED Discharge Orders    None       Note:  This document was prepared using Dragon voice recognition software and may include unintentional dictation errors.    Hinda Kehr, MD 11/29/17 [phone]

## 2017-11-28 NOTE — ED Triage Notes (Addendum)
Pt arrived from home with a complaint of a fall. Pt was laying on floor for 2 hours before he was found by his girlfriend. Pt reports that he was walking in kitchen when he blacked out for a minute and woke up on floor. Pt states he hit his back below the right scapula. Pt has Hx of A-fib. VS per EMS BP-156/92 HR-145 O2sat-90RA EMS placed pt on oxygen for comfort. Pt was given 22mcg of Fentanyl per EMS en route to hospital. Pt also has complaints of swelling in lower extremities, edema is present. EMS placed a 20 gauge in left hand. Pt is alert and oriented x 4. Pt wears C-PAP at bedtime. Pt is not normally on oxygen through the day.

## 2017-11-28 NOTE — ED Notes (Signed)
Placed pt on 3 L nasal cannula for comfort

## 2017-11-28 NOTE — ED Notes (Signed)
Pt already had a previous line,was told by Vicente Males RN to only get one set of Blood Cultures if starting a new line.

## 2017-11-28 NOTE — ED Notes (Signed)
Date and time results received: 11/28/17 2218 (use smartphrase ".now" to insert current time)  Test: lactic Acid Critical Value: 3.1  Name of Provider Notified: Dr. Karma Greaser  Orders Received? Or Actions Taken?:

## 2017-11-29 ENCOUNTER — Inpatient Hospital Stay
Admit: 2017-11-29 | Discharge: 2017-11-29 | Disposition: A | Discharge status: Home / Self Care | Payer: PPO | Attending: Internal Medicine | Admitting: Internal Medicine

## 2017-11-29 ENCOUNTER — Inpatient Hospital Stay: Payer: PPO

## 2017-11-29 LAB — COMPREHENSIVE METABOLIC PANEL
ALK PHOS: 163 U/L — AB (ref 38–126)
ALT: 25 U/L (ref 0–44)
AST: 38 U/L (ref 15–41)
Albumin: 3.6 g/dL (ref 3.5–5.0)
Anion gap: 18 — ABNORMAL HIGH (ref 5–15)
BUN: 10 mg/dL (ref 8–23)
CALCIUM: 8.4 mg/dL — AB (ref 8.9–10.3)
CHLORIDE: 96 mmol/L — AB (ref 98–111)
CO2: 26 mmol/L (ref 22–32)
Creatinine, Ser: 0.88 mg/dL (ref 0.61–1.24)
Glucose, Bld: 104 mg/dL — ABNORMAL HIGH (ref 70–99)
Potassium: 3.4 mmol/L — ABNORMAL LOW (ref 3.5–5.1)
SODIUM: 140 mmol/L (ref 135–145)
Total Bilirubin: 1.7 mg/dL — ABNORMAL HIGH (ref 0.3–1.2)
Total Protein: 7.1 g/dL (ref 6.5–8.1)

## 2017-11-29 LAB — CK: CK TOTAL: 44 U/L — AB (ref 49–397)

## 2017-11-29 LAB — TROPONIN I: Troponin I: <0.03 ng/mL (ref <0.03)

## 2017-11-29 LAB — GLUCOSE, CAPILLARY
GLUCOSE-CAPILLARY: 91 mg/dL (ref 70–99)
GLUCOSE-CAPILLARY: 146 mg/dL — AB (ref 70–99)
Glucose-Capillary: 163 mg/dL — ABNORMAL HIGH (ref 70–99)
Glucose-Capillary: 162 mg/dL — ABNORMAL HIGH (ref 70–99)

## 2017-11-29 LAB — DIGOXIN LEVEL

## 2017-11-29 LAB — MAGNESIUM: Magnesium: 1.5 mg/dL — ABNORMAL LOW (ref 1.7–2.4)

## 2017-11-29 LAB — LACTIC ACID, PLASMA: Lactic Acid, Venous: 3.9 mmol/L (ref 0.5–1.9)

## 2017-11-29 LAB — LIPASE, BLOOD: Lipase: 21 U/L (ref 11–51)

## 2017-11-29 LAB — PHOSPHORUS: PHOSPHORUS: 3.6 mg/dL (ref 2.5–4.6)

## 2017-11-29 LAB — PROCALCITONIN: Procalcitonin: <0.1 ng/mL

## 2017-11-29 MED ORDER — ALLOPURINOL 300 MG PO TABS
300.0000 mg | ORAL_TABLET | Freq: Every day | ORAL | Status: DC
  Administered 2017-11-29 – 2017-12-07 (×9): 300 mg via ORAL
  Filled 2017-11-29 (×9): qty 1

## 2017-11-29 MED ORDER — SODIUM CHLORIDE 0.9% FLUSH
3.0000 mL | Freq: Two times a day (BID) | INTRAVENOUS | Status: DC
  Administered 2017-11-29 – 2017-12-07 (×18): 3 mL via INTRAVENOUS

## 2017-11-29 MED ORDER — INSULIN ASPART 100 UNIT/ML ~~LOC~~ SOLN
0.0000 [IU] | Freq: Three times a day (TID) | SUBCUTANEOUS | Status: DC
  Administered 2017-11-29: 1 [IU] via SUBCUTANEOUS
  Administered 2017-11-29: 2 [IU] via SUBCUTANEOUS
  Administered 2017-11-30 – 2017-12-01 (×5): 1 [IU] via SUBCUTANEOUS
  Administered 2017-12-02: 2 [IU] via SUBCUTANEOUS
  Administered 2017-12-02: 1 [IU] via SUBCUTANEOUS
  Administered 2017-12-03: 2 [IU] via SUBCUTANEOUS
  Administered 2017-12-03 – 2017-12-04 (×2): 1 [IU] via SUBCUTANEOUS
  Administered 2017-12-04: 2 [IU] via SUBCUTANEOUS
  Administered 2017-12-04: 1 [IU] via SUBCUTANEOUS
  Administered 2017-12-05: 3 [IU] via SUBCUTANEOUS
  Administered 2017-12-05 – 2017-12-06 (×2): 2 [IU] via SUBCUTANEOUS
  Administered 2017-12-06: 3 [IU] via SUBCUTANEOUS
  Administered 2017-12-07: 1 [IU] via SUBCUTANEOUS
  Filled 2017-11-29 (×20): qty 1

## 2017-11-29 MED ORDER — LORAZEPAM 2 MG/ML IJ SOLN: 1.0000 mg | Freq: Four times a day (QID) | INTRAMUSCULAR | Status: DC | PRN

## 2017-11-29 MED ORDER — MAGNESIUM SULFATE 2 GM/50ML IV SOLN: 2.0000 g | INTRAVENOUS | Status: AC

## 2017-11-29 MED ORDER — VITAMIN B-1 100 MG PO TABS
100.0000 mg | ORAL_TABLET | Freq: Every day | ORAL | Status: DC
  Administered 2017-11-30 – 2017-12-07 (×8): 100 mg via ORAL
  Filled 2017-11-29 (×8): qty 1

## 2017-11-29 MED ORDER — THIAMINE HCL 100 MG/ML IJ SOLN
100.0000 mg | Freq: Every day | INTRAMUSCULAR | Status: DC
  Administered 2017-11-29: 100 mg via INTRAVENOUS
  Filled 2017-11-29 (×2): qty 2

## 2017-11-29 MED ORDER — LORAZEPAM 2 MG/ML IJ SOLN: 0.0000 mg | Freq: Two times a day (BID) | INTRAMUSCULAR | Status: DC

## 2017-11-29 MED ORDER — INSULIN ASPART 100 UNIT/ML ~~LOC~~ SOLN: 0.0000 [IU] | Freq: Every day | SUBCUTANEOUS | Status: DC

## 2017-11-29 MED ORDER — IPRATROPIUM-ALBUTEROL 0.5-2.5 (3) MG/3ML IN SOLN
3.0000 mL | RESPIRATORY_TRACT | Status: DC | PRN
  Administered 2017-12-02 (×2): 3 mL via RESPIRATORY_TRACT
  Filled 2017-11-29 (×2): qty 3

## 2017-11-29 MED ORDER — FUROSEMIDE 40 MG PO TABS
40.0000 mg | ORAL_TABLET | Freq: Every day | ORAL | Status: DC
  Administered 2017-11-29 – 2017-12-03 (×5): 40 mg via ORAL
  Filled 2017-11-29 (×5): qty 1

## 2017-11-29 MED ORDER — MAGNESIUM SULFATE 2 GM/50ML IV SOLN
2.0000 g | Freq: Once | INTRAVENOUS | Status: AC
  Administered 2017-11-29: 2 g via INTRAVENOUS
  Filled 2017-11-29: qty 50

## 2017-11-29 MED ORDER — METOPROLOL TARTRATE 5 MG/5ML IV SOLN
5.0000 mg | Freq: Once | INTRAVENOUS | Status: AC
  Administered 2017-11-29: 5 mg via INTRAVENOUS
  Filled 2017-11-29: qty 5

## 2017-11-29 MED ORDER — DILTIAZEM HCL ER COATED BEADS 180 MG PO CP24
360.0000 mg | ORAL_CAPSULE | Freq: Every day | ORAL | Status: DC
  Administered 2017-11-29 – 2017-12-07 (×9): 360 mg via ORAL
  Filled 2017-11-29 (×9): qty 2

## 2017-11-29 MED ORDER — LISINOPRIL 5 MG PO TABS
5.0000 mg | ORAL_TABLET | Freq: Every day | ORAL | Status: DC
  Administered 2017-11-29 – 2017-12-07 (×9): 5 mg via ORAL
  Filled 2017-11-29 (×9): qty 1

## 2017-11-29 MED ORDER — METOPROLOL TARTRATE 50 MG PO TABS
50.0000 mg | ORAL_TABLET | Freq: Once | ORAL | Status: AC
  Administered 2017-11-29: 50 mg via ORAL
  Filled 2017-11-29: qty 1

## 2017-11-29 MED ORDER — LORAZEPAM 2 MG/ML IJ SOLN
0.0000 mg | Freq: Four times a day (QID) | INTRAMUSCULAR | Status: DC
  Administered 2017-11-29: 2 mg via INTRAVENOUS
  Filled 2017-11-29: qty 1

## 2017-11-29 MED ORDER — DIGOXIN 125 MCG PO TABS
125.0000 ug | ORAL_TABLET | Freq: Every day | ORAL | Status: DC
  Administered 2017-11-29 – 2017-12-07 (×9): 125 ug via ORAL
  Filled 2017-11-29 (×10): qty 1

## 2017-11-29 MED ORDER — BISACODYL 5 MG PO TBEC: 5.0000 mg | DELAYED_RELEASE_TABLET | Freq: Every day | ORAL | Status: DC | PRN

## 2017-11-29 MED ORDER — SENNOSIDES-DOCUSATE SODIUM 8.6-50 MG PO TABS: 1.0000 | ORAL_TABLET | Freq: Every evening | ORAL | Status: DC | PRN

## 2017-11-29 MED ORDER — ADULT MULTIVITAMIN W/MINERALS CH
1.0000 | ORAL_TABLET | Freq: Every day | ORAL | Status: DC
  Administered 2017-11-29 – 2017-11-30 (×2): 1 via ORAL
  Filled 2017-11-29 (×2): qty 1

## 2017-11-29 MED ORDER — FOLIC ACID 1 MG PO TABS
1.0000 mg | ORAL_TABLET | Freq: Every day | ORAL | Status: DC
Start: 2017-11-29 — End: 2017-12-07
  Administered 2017-11-29 – 2017-12-07 (×9): 1 mg via ORAL
  Filled 2017-11-29 (×9): qty 1

## 2017-11-29 MED ORDER — ONDANSETRON HCL 4 MG/2ML IJ SOLN
4.0000 mg | Freq: Four times a day (QID) | INTRAMUSCULAR | Status: DC | PRN
Start: 2017-11-29 — End: 2017-12-07

## 2017-11-29 MED ORDER — SODIUM CHLORIDE 0.9 % IV SOLN
100.0000 mg | Freq: Two times a day (BID) | INTRAVENOUS | Status: DC
  Administered 2017-11-29: 100 mg via INTRAVENOUS
  Filled 2017-11-29 (×3): qty 100

## 2017-11-29 MED ORDER — LORAZEPAM 1 MG PO TABS
1.0000 mg | ORAL_TABLET | Freq: Four times a day (QID) | ORAL | Status: DC | PRN
  Administered 2017-12-01: 1 mg via ORAL
  Filled 2017-11-29: qty 1

## 2017-11-29 MED ORDER — ONDANSETRON HCL 4 MG PO TABS: 4.0000 mg | ORAL_TABLET | Freq: Four times a day (QID) | ORAL | Status: DC | PRN

## 2017-11-29 MED ORDER — POTASSIUM CHLORIDE CRYS ER 20 MEQ PO TBCR
40.0000 meq | EXTENDED_RELEASE_TABLET | Freq: Two times a day (BID) | ORAL | Status: AC
  Administered 2017-11-29: 40 meq via ORAL
  Filled 2017-11-29: qty 2

## 2017-11-29 MED ORDER — SODIUM CHLORIDE 0.9 % IV SOLN
500.0000 mg | INTRAVENOUS | Status: DC
  Filled 2017-11-29: qty 500

## 2017-11-29 MED ORDER — HYDROMORPHONE HCL 1 MG/ML IJ SOLN
0.5000 mg | INTRAMUSCULAR | Status: DC | PRN
  Administered 2017-11-29 – 2017-12-02 (×20): 0.5 mg via INTRAVENOUS
  Filled 2017-11-29 (×20): qty 1

## 2017-11-29 MED ORDER — SODIUM CHLORIDE 0.9 % IV SOLN: 1.0000 g | INTRAVENOUS | Status: DC

## 2017-11-29 NOTE — Progress Notes (Signed)
CODE SEPSIS - PHARMACY COMMUNICATION  **Broad Spectrum Antibiotics should be administered within 1 hour of Sepsis diagnosis**  Time Code Sepsis Called/Page Received: 2248  Antibiotics Ordered: azithro/ceftriaxone  Time of 1st antibiotic administration: 2337  Additional action taken by pharmacy:   If necessary, Name of Provider/Nurse Contacted:     Tobie Lords ,PharmD Clinical Pharmacist  11/29/2017  1:03 AM

## 2017-11-29 NOTE — Plan of Care (Signed)
  Problem: Education: Goal: Knowledge of General Education information will improve Description: Including pain rating scale, medication(s)/side effects and non-pharmacologic comfort measures Outcome: Progressing   Problem: Health Behavior/Discharge Planning: Goal: Ability to manage health-related needs will improve Outcome: Progressing   Problem: Nutrition: Goal: Adequate nutrition will be maintained Outcome: Progressing   

## 2017-11-29 NOTE — Plan of Care (Signed)
  Problem: Skin Integrity: Goal: Risk for impaired skin integrity will decrease Outcome: Progressing  Wound consult ordered Problem: Cardiac: Goal: Ability to achieve and maintain adequate cardiopulmonary perfusion will improve Outcome: Progressing

## 2017-11-29 NOTE — H&P (Addendum)
[facility] [place] NAME: Brendan Edwards    MR#:  [id number]  DATE OF BIRTH:  [birth year]  DATE OF ADMISSION:  11/28/2017  PRIMARY CARE PHYSICIAN: Tracie Harrier, MD   REQUESTING/REFERRING PHYSICIAN: Hinda Kehr, MD  CHIEF COMPLAINT:  No chief complaint on file.  HISTORY OF PRESENT ILLNESS:  Brendan Edwards  is a 72 y.o. male with a known history of T2NIDDM, HTN, Afib (Eliquis + Digoxin), chronic systolic CHF (EF 22-29% as of 07/29/2017 Echo), EtOH abuse/dependence who p/w EtOH intoxication, syncope, fall, back pain. Pt is a poor historian, and can tell me very little. He states that he stood up from his recliner to go to the kitchen, and passed out. He states his back hurts (beneath R shoulder blade). He denies shortness of breath or CP. His girlfriend (at bedside) states that he had been complaining of SOB occasionally for the past 1wk or so. She received a phone call from the pt overnight stating that he had fallen and could not get up. She went to his home to find him lying on the floor on his R side, w/ severe back pain worsened w/ movement/position change. He had been down for ~1hr. She believes he may have hit his back on the TV stand when he fell. She called EMS. She states he did not appear to be short of breath at that time. Pt denies cough, fever, chills, diaphoresis, rigors, night sweats, N/V/D/AP, HA/blurred vision, urinary symptoms. His girlfriend says that he apparently does not take his Lasix as he is supposed to, because he does not like the increased frequency of urination. He claims to take the rest of his medications regularly.  PAST MEDICAL HISTORY:   Past Medical History:  Diagnosis Date  . A-fib ([place])   . Benign prostatic hyperplasia   . Cardiomyopathy, secondary ([place])   . GERD (gastroesophageal reflux disease)   . Gout   . Hyperlipidemia   . Hypertension   . Obesity   . Sleep apnea     PAST SURGICAL HISTORY:    Past Surgical History:  Procedure Laterality Date  . APPENDECTOMY    . COLONOSCOPY    . COLONOSCOPY WITH PROPOFOL N/A 11/29/2014   Procedure: COLONOSCOPY WITH PROPOFOL;  Surgeon: Hulen Luster, MD;  Location: [facility] ENDOSCOPY;  Service: Gastroenterology;  Laterality: N/A;  . ESOPHAGOGASTRODUODENOSCOPY    . JOINT REPLACEMENT     right and left hip    SOCIAL HISTORY:   Social History   Tobacco Use  . Smoking status: Current Every Day Smoker    Types: Cigars  . Smokeless tobacco: Never Used  Substance Use Topics  . Alcohol use: Yes    FAMILY HISTORY:   Family History  Problem Relation Age of Onset  . Hypertension Mother     DRUG ALLERGIES:   Allergies  Allergen Reactions  . Oxycodone Other (See Comments)    Hallucinations  . Cardura [Doxazosin Mesylate]   . Darvon [Propoxyphene]   . Penicillins     Has patient had a PCN reaction causing immediate rash, facial/tongue/throat swelling, SOB or lightheadedness with hypotension: Yes Has patient had a PCN reaction causing severe rash involving mucus membranes or skin necrosis: No Has patient had a PCN reaction that required hospitalization: No Has patient had a PCN reaction occurring within the last 10 years: No If all of the above answers are "NO", then may proceed with Cephalosporin use.  Marland Kitchen Requip [Ropinirole Hcl]   .  Septra [Sulfamethoxazole-Trimethoprim]     REVIEW OF SYSTEMS:   Review of Systems  Constitutional: Negative for chills, diaphoresis, fever, malaise/fatigue and weight loss.  HENT: Negative for congestion, ear pain, hearing loss, nosebleeds, sinus pain, sore throat and tinnitus.   Eyes: Negative for blurred vision, double vision and photophobia.  Respiratory: Positive for shortness of breath. Negative for cough, hemoptysis, sputum production and wheezing.   Cardiovascular: Positive for leg swelling. Negative for chest pain, palpitations, orthopnea, claudication and PND.  Gastrointestinal: Negative for  abdominal pain, blood in stool, constipation, diarrhea, heartburn, melena, nausea and vomiting.  Genitourinary: Negative for dysuria, frequency, hematuria and urgency.  Musculoskeletal: Positive for back pain and falls. Negative for joint pain, myalgias and neck pain.  Skin: Negative for itching and rash.  Neurological: Positive for loss of consciousness. Negative for dizziness, tingling, tremors, sensory change, speech change, focal weakness, seizures, weakness and headaches.  Psychiatric/Behavioral: The patient does not have insomnia.    MEDICATIONS AT HOME:   Prior to Admission medications   Medication Sig Start Date End Date Taking? Authorizing Provider  acetaminophen (TYLENOL) 325 MG tablet Take 2 tablets (650 mg total) by mouth every 6 (six) hours as needed for mild pain (or Fever >/= 101). 05/13/17  Yes Dustin Flock, MD  allopurinol (ZYLOPRIM) 300 MG tablet Take 300 mg by mouth daily.   Yes [provider]  amiodarone (PACERONE) 200 MG tablet Take 1 tablet (200 mg total) by mouth 2 (two) times daily. 07/29/17  Yes Gouru, Illene Silver, MD  apixaban (ELIQUIS) 5 MG TABS tablet Take 5 mg by mouth 2 (two) times daily.   Yes [provider]  digoxin (LANOXIN) 0.125 MG tablet Take 1 tablet (125 mcg total) by mouth daily. 05/13/17 05/13/18 Yes Dustin Flock, MD  diltiazem (CARDIZEM CD) 360 MG 24 hr capsule Take 1 capsule (360 mg total) by mouth daily. 07/29/17  Yes Gouru, Illene Silver, MD  folic acid (FOLVITE) 1 MG tablet Take 1 tablet (1 mg total) by mouth daily. 08/20/16  Yes Fritzi Mandes, MD  furosemide (LASIX) 40 MG tablet Take 40 mg by mouth daily.   Yes [provider]  lisinopril (PRINIVIL,ZESTRIL) 5 MG tablet Take 5 mg by mouth daily.   Yes [provider]  potassium chloride SA (K-DUR,KLOR-CON) 20 MEQ tablet Take 20 mEq by mouth 2 (two) times daily.   Yes [provider]  traMADol (ULTRAM) 50 MG tablet Take 50 mg by mouth at bedtime as needed.  04/28/17  Yes  [provider]  benzocaine (ORAJEL) 10 % mucosal gel Use as directed in the mouth or throat 4 (four) times daily as needed for mouth pain. Patient not taking: Reported on 05/10/2017 08/20/16   Fritzi Mandes, MD  oxyCODONE-acetaminophen (PERCOCET/ROXICET) 5-325 MG tablet Take 1 tablet by mouth every 6 (six) hours as needed for moderate pain. Patient not taking: Reported on 11/28/2017 05/13/17   Dustin Flock, MD  thiamine 50 MG tablet Take 1 tablet (50 mg total) by mouth daily. Patient not taking: Reported on 07/28/2017 08/20/16   Fritzi Mandes, MD      VITAL SIGNS:  Blood pressure (!) 144/101, pulse (!) 154, temperature 98.6 F (37 C), temperature source Oral, resp. rate 20, height 5\' 9"  (1.753 m), weight 93 kg, SpO2 95 %.  PHYSICAL EXAMINATION:  Physical Exam  Constitutional: He is oriented to person, place, and time. He appears well-developed and well-nourished. He is active and cooperative. He has a sickly appearance. He appears ill. No distress. He is  not intubated. Nasal cannula in place.  HENT:  Head: Normocephalic.  Mouth/Throat: Oropharynx is clear and moist. No oropharyngeal exudate.  Eyes: Conjunctivae, EOM and lids are normal. No scleral icterus.  Neck: Neck supple. No JVD present. No thyromegaly present.  Cardiovascular: S1 normal, S2 normal and normal heart sounds. An irregularly irregular rhythm present. Tachycardia present. Exam reveals no gallop, no S3, no S4, no distant heart sounds and no friction rub.  No murmur heard. Pulmonary/Chest: Effort normal. No accessory muscle usage or stridor. Tachypnea noted. No apnea and no bradypnea. He is not intubated. No respiratory distress. He has decreased breath sounds in the right upper field, the right middle field, the right lower field, the left upper field, the left middle field and the left lower field. He has no wheezes. He has no rhonchi. He has rales in the left lower field.  Abdominal: Soft. He exhibits no distension. Bowel  sounds are decreased. There is no tenderness. There is no rigidity, no rebound and no guarding.  Musculoskeletal: Normal range of motion. He exhibits edema. He exhibits no tenderness.  Lymphadenopathy:    He has no cervical adenopathy.  Neurological: He is alert and oriented to person, place, and time. He is not disoriented.  Skin: Skin is warm and dry. No rash noted. He is not diaphoretic. No erythema.  Psychiatric: He has a normal mood and affect. His behavior is normal. His mood appears not anxious. His affect is not angry, not blunt, not labile and not inappropriate. His speech is not rapid and/or pressured, not delayed, not tangential and not slurred. He is not agitated, not aggressive, not hyperactive, not slowed, not withdrawn, not actively hallucinating and not combative. Cognition and memory are impaired. He does not express impulsivity. He does not exhibit a depressed mood. He is communicative. He exhibits abnormal recent memory. He is attentive.   LABORATORY PANEL:   CBC Recent Labs  Lab 11/28/17 2135  WBC 13.7*  HGB 16.6  HCT 49.5  PLT 230   ------------------------------------------------------------------------------------------------------------------  Chemistries  Recent Labs  Lab 11/28/17 2331  NA 140  K 3.4*  CL 96*  CO2 26  GLUCOSE 104*  BUN 10  CREATININE 0.88  CALCIUM 8.4*  MG 1.5*  AST 38  ALT 25  ALKPHOS 163*  BILITOT 1.7*   ------------------------------------------------------------------------------------------------------------------  Cardiac Enzymes Recent Labs  Lab 11/28/17 2331  TROPONINI <0.03   ------------------------------------------------------------------------------------------------------------------  RADIOLOGY:  Dg Chest Portable 1 View  Result Date: 11/28/2017 CLINICAL DATA:  Chest pain and syncope EXAM: PORTABLE CHEST 1 VIEW COMPARISON:  07/28/2017, 05/10/2017 FINDINGS: Low lung volumes. Right lung is clear. Moderate left  pleural effusion. Cardiomegaly. Dense airspace disease at the lingula and left base. IMPRESSION: Moderate left pleural effusion with atelectasis or pneumonia at the lingula and left base. Cardiomegaly. Electronically Signed   By: Donavan Foil M.D.   On: 11/28/2017 22:02   IMPRESSION AND PLAN:   A/P: 62M w/ PMHx T2NIDDM, HTN, Afib (Eliquis + Digoxin), chronic systolic CHF (EF 53-61% as of 07/29/2017 Echo), EtOH abuse/dependence p/w EtOH intoxication, syncope, fall, back pain. Found to be in Afib + RVR, HR 150s. Acute hypoxemic respiratory failure, CXR (+) L effusion + pneumonia. SIRS (+), sepsis, Lactate 3.1. EtOH level 167. Hypokalemia, hyperglycemia (w/ T2NIDDM), hypocalcemia, mild hyperbilirubinemia, elevated BNP, macrocytosis w/o anemia. -Acute hypoxemic respiratory failure, acute bacterial community acquired pneumonia, sepsis: SpO2 88% on room air. Improved w/ [place]. CXR (+) L effusion + pneumonia. Leukocytosis, tachypnea, tachycardia, hypoxia. SIRS (+), sepsis. Lactate  3.1. IVF, rpt pending. Ceftriaxone + Azithromycin. PCT, BCx, U/A, UCx, sputum Cx, UStrep + ULegionella Ag. PRN nebs for mobilization of secretions, Incentive spirometry. Not clinically in acute COPD or acute CHF exacerbation. -Afib + RVR: Afib, HR 150s. Likely 2/2 pneumonia, hypoxia, sepsis, EtOH. K+ 3.4, goal > 4.0. Mag level pending, goal > 2.0. Trop-I (-). Echo pending. (-) anemia. TSH WNL as of 07/29/2017. Amio gtt. Metoprolol IV PRN as BP permits. Resume home Eliquis + Cardizem. Digoxin level, resume as appropriate. Mgmt of hypoxia, pneumonia/sepsis as above. -EtOH abuse/dependence/intoxication: EtOH level 167 on admission. Is dishonest about his EtOH use. (-) diaphoresis/tremor. CIWA, Ativan PRN. Thiamine, folate, MVI. No Tylenol. -Syncope: Likely 2/2 EtOH intoxication, orthostasis. Trop-I (-). CT head (-) acute intracranial abnl. Orthostatic VS, FSG, neuro checks q4h x24hr, fall precautions. Echo pending. IVF. Last EF 50-55%  (07/29/2017), monitor for volume overload. -Fall/back pain: CPK 44. Symptomatic mgmt, pain ctrl. Incentive spirometry. -Hypokalemia: Replete K+ (3.4, goal > 4.0) and Mag (1.5, goal > 2.0). -Hyperglycemia/T2NIDDM: SSI. -Hypocalcemia: Ionized calcium. -Hyperbilirubinemia: TBili 1.7, (-) jaundice/icterus. Likely 2/2 acute EtOH abuse, volume depletion. -Elevated BNP: BNP 250 on present admission. Was > 1000 on prior admission. (+) trace B/L LE edema w/ chronic skin changes, (-) JVD. Not clinically in acute CHF exacerbation. C/w Lasix, ACE-I. Not on beta blocker at present time. -Macrocytosis w/o anemia: Likely 2/2 acute EtOH abuse. -c/w home meds/formulary subs. -FEN/GI: Cardiac diabetic diet. -DVT PPx: Eliquis. -Code status: Full code. -Disposition: Admission, > 2 midnights.   All the records are reviewed and case discussed with ED provider. Management plans discussed with the patient, family and they are in agreement.  CODE STATUS: Full code.  TOTAL TIME TAKING CARE OF THIS PATIENT: 90 minutes.    Arta Silence M.D on 11/29/2017 at 12:48 AM  Between 7am to 6pm - Pager - [phone]  After 6pm go to [email or web address] - Proofreader  [facility]  Office  [phone]  CC: Primary care physician; Tracie Harrier, MD   Note: This dictation was prepared with Dragon dictation along with smaller phrase technology. Any transcriptional errors that result from this process are unintentional.

## 2017-11-29 NOTE — Progress Notes (Signed)
*  PRELIMINARY RESULTS* Echocardiogram 2D Echocardiogram has been performed.  Brendan Edwards 11/29/2017, 2:00 PM

## 2017-11-29 NOTE — Consult Note (Signed)
[facility] Nurse wound consult note Reason for Consult:open wounds LEs Patient reports he has not been told in the past to wear compression socks and reports he has never had his legs wrapped for compression. He does report that the discoloration of the distal portions of his legs (mallelor and pretibial) has been present for several years which would be indicative of hemosiderin staining and venous stasis dx.  He reports increasing edema which is improved when his legs are elevated, however at home he stay in the recliner all the time with his legs down                                     Wound type: venous stasis with partial thickness superficial ulcerations of the LLE Pressure Injury POA: NA Measurement: two areas on the left pretibial region each aprox. 2cm x 2cm x 0.1cm  Wound bed: partial thickness, macerated from drainage. Clean, pink Drainage (amount, consistency, odor) serous, no odor Periwound:2-3+ pitting edema with palpable distal pulses, feet are warm Dressing procedure/placement/frequency: Covered ulcerations with silicone foam. Recommend ABI for arterial perfusion assessment prior to placement of Unna's boots for compression therapy.  Contacted hospitalist for orders. [place] nurse team to follow up on results and place unnas boots bilaterally if ABI results are normal.  Will need HHRN for Unna's boot changes 2x wk initially then 1x wk or follow up in a wound care center of the patient's choice.  Could be followed in PCP office for Unna's boot changes weekly if more convenient but it sounds if patient's safety in the home (PT evaluation) and RN in the home may benefit the patient.    [place] nurse team will follow up for ABI results and Unna's boot orders if MD agrees.  [place], [place], [place]

## 2017-11-29 NOTE — Progress Notes (Signed)
Patient admitted because of fall found to have sepsis with pneumonia, atrial fibrillation with RVR.  Patient also has severe upper back pain due to fall.  CT angios chest done in the emergency room showed left pleural effusion and pneumonia on the left side.  He complains of severe upper back pain and more when he moves a little bit in the bed. 1.  Sepsis without septic shock secondary to left upper lobe pneumonia likely bacterial pneumonia: Continue IV antibiotics, IV fluids. 2.  A. fib with RVR, patient is on amiodarone drip, telemetry.  Primary cardiologist Dr. Saralyn Pilar has been consulted.  Continue Eliquis. 3.  History of fall, unable to get up 4 to 3 hours at home now has severe upper back pain get x-ray of upper and lower back, continue pain medicines. 4.  Severe hypomagnesemia, replace magnesium. 5.  Hypokalemia replace potassium. 6.  Bilateral lower extremity edema, chronic venous stasis, wound care consulted. Acute respiratory failure with hypoxia secondary to pneumonia.  Continue oxygen, IV antibiotics.  Time spent is 25 minutes

## 2017-11-29 NOTE — Consult Note (Signed)
Reason for Consult: Shortness of breath heart failure A. fib Referring Physician: Dr. Vianne Bulls hospitalist Cardiologist Dr. Tyron Russell Brendan Edwards is an 72 y.o. male.  HPI: Patient is a 72 year old male presents with atrial fibrillation hypertension diabetes chronic congestive diastolic heart failure obesity alcohol abuse shortness of breath with leg edema hypertension sleep apnea noncompliant.  Patient had not been feeling well at home recently low-grade temperature shortness of breath.  Presented to the emergency room and subsequently found to have pneumonia have also not be atrial fibrillation on anticoagulation now being admitted for further assessment denies any chest pain  Past Medical History:  Diagnosis Date  . A-fib ([place])   . Benign prostatic hyperplasia   . Cardiomyopathy, secondary ([place])   . GERD (gastroesophageal reflux disease)   . Gout   . Hyperlipidemia   . Hypertension   . Obesity   . Sleep apnea     Past Surgical History:  Procedure Laterality Date  . APPENDECTOMY    . COLONOSCOPY    . COLONOSCOPY WITH PROPOFOL N/A 11/29/2014   Procedure: COLONOSCOPY WITH PROPOFOL;  Surgeon: Hulen Luster, MD;  Location: [facility] ENDOSCOPY;  Service: Gastroenterology;  Laterality: N/A;  . ESOPHAGOGASTRODUODENOSCOPY    . JOINT REPLACEMENT     right and left hip    Family History  Problem Relation Age of Onset  . Hypertension Mother     Social History:  reports that he has been smoking cigars. He has never used smokeless tobacco. He reports that he drinks alcohol. He reports that he does not use drugs.  Allergies:  Allergies  Allergen Reactions  . Oxycodone Other (See Comments)    Hallucinations  . Cardura [Doxazosin Mesylate]   . Darvon [Propoxyphene]   . Penicillins     Has patient had a PCN reaction causing immediate rash, facial/tongue/throat swelling, SOB or lightheadedness with hypotension: Yes Has patient had a PCN reaction causing severe rash involving mucus membranes or  skin necrosis: No Has patient had a PCN reaction that required hospitalization: No Has patient had a PCN reaction occurring within the last 10 years: No If all of the above answers are "NO", then may proceed with Cephalosporin use.  Marland Kitchen Requip [Ropinirole Hcl]   . Septra [Sulfamethoxazole-Trimethoprim]     Medications: Brendan have reviewed the patient's current medications.  Results for orders placed or performed during the hospital encounter of 11/28/17 (from the past 48 hour(s))  Ethanol     Status: Abnormal   Collection Time: 11/28/17  9:35 PM  Result Value Ref Range   Alcohol, Ethyl (B) 167 (H) <10 mg/dL    Comment: (NOTE) Lowest detectable limit for serum alcohol is 10 mg/dL. For medical purposes only. Performed at [facility], [address] [place], [place] [address]   Brain natriuretic peptide     Status: Abnormal   Collection Time: 11/28/17  9:35 PM  Result Value Ref Range   B Natriuretic Peptide 250.0 (H) 0.0 - 100.0 pg/mL    Comment: Performed at [facility], [address] [place], [place] [address]  Lactic acid, plasma     Status: Abnormal   Collection Time: 11/28/17  9:35 PM  Result Value Ref Range   Lactic Acid, Venous 3.1 (HH) 0.5 - 1.9 mmol/L    Comment: CRITICAL RESULT CALLED TO, READ BACK BY AND VERIFIED WITH KALA KEEN AT 2218 11/28/17.PMH Performed at [facility], [address] [place], [place] [address]   CBC with Differential  Status: Abnormal   Collection Time: 11/28/17  9:35 PM  Result Value Ref Range   WBC 13.7 (H) 3.8 - 10.6 K/uL   RBC 4.67 4.40 - 5.90 MIL/uL   Hemoglobin 16.6 13.0 - 18.0 g/dL   HCT 49.5 40.0 - 52.0 %   MCV 106.0 (H) 80.0 - 100.0 fL   MCH 35.5 (H) 26.0 - 34.0 pg   MCHC 33.5 32.0 - 36.0 g/dL   RDW 16.0 (H) 11.5 - 14.5 %   Platelets 230 150 - 440 K/uL   Neutrophils Relative % 89 %   Neutro Abs 12.1 (H) 1.4 - 6.5 K/uL   Lymphocytes Relative 3 %   Lymphs Abs 0.5 (L) 1.0 - 3.6 K/uL   Monocytes  Relative 7 %   Monocytes Absolute 0.9 0.2 - 1.0 K/uL   Eosinophils Relative 0 %   Eosinophils Absolute 0.0 0 - 0.7 K/uL   Basophils Relative 1 %   Basophils Absolute 0.1 0 - 0.1 K/uL    Comment: Performed at [facility], [address] [place], [place] [address]  Protime-INR     Status: Abnormal   Collection Time: 11/28/17  9:35 PM  Result Value Ref Range   Prothrombin Time 16.7 (H) 11.4 - 15.2 seconds   INR 1.36     Comment: Performed at [facility], [address] [place], [place] [address]  Digoxin level     Status: Abnormal   Collection Time: 11/28/17  9:35 PM  Result Value Ref Range   Digoxin Level <0.2 (L) 0.8 - 2.0 ng/mL    Comment: Performed at [facility], [address] [place], [place] [address]  Blood Culture (routine x 2)     Status: None (Preliminary result)   Collection Time: 11/28/17 11:31 PM  Result Value Ref Range   Specimen Description BLOOD RIGHT ANTECUBITAL    Special Requests      BOTTLES DRAWN AEROBIC AND ANAEROBIC Blood Culture adequate volume   Culture      NO GROWTH < 12 HOURS Performed at  [facility], [address] [place], [place] [address]    Report Status PENDING   CK     Status: Abnormal   Collection Time: 11/28/17 11:31 PM  Result Value Ref Range   Total CK 44 (L) 49 - 397 U/L    Comment: Performed at [facility], [address] [place], [place] [address]  Comprehensive metabolic panel     Status: Abnormal   Collection Time: 11/28/17 11:31 PM  Result Value Ref Range   Sodium 140 135 - 145 mmol/L   Potassium 3.4 (L) 3.5 - 5.1 mmol/L   Chloride 96 (L) 98 - 111 mmol/L   CO2 26 22 - 32 mmol/L   Glucose, Bld 104 (H) 70 - 99 mg/dL   BUN 10 8 - 23 mg/dL   Creatinine, Ser 0.88 0.61 - 1.24 mg/dL   Calcium 8.4 (L) 8.9 - 10.3 mg/dL   Total Protein 7.1 6.5 - 8.1 g/dL   Albumin 3.6 3.5 - 5.0 g/dL   AST 38 15 - 41 U/L   ALT 25 0 - 44 U/L   Alkaline Phosphatase 163 (H) 38 - 126 U/L   Total  Bilirubin 1.7 (H) 0.3 - 1.2 mg/dL   GFR calc non Af Amer >60 >60 mL/min   GFR calc Af Amer >60 >60 mL/min    Comment: (NOTE) The eGFR has been calculated using the CKD EPI equation. This calculation has not  been validated in all clinical situations. eGFR's persistently <60 mL/min signify possible Chronic Kidney Disease.    Anion gap 18 (H) 5 - 15    Comment: Performed at [facility], [address] [place], [place] [address]  Lipase, blood     Status: None   Collection Time: 11/28/17 11:31 PM  Result Value Ref Range   Lipase 21 11 - 51 U/L    Comment: Performed at [facility], [address] [place], [place] [address]  Procalcitonin     Status: None   Collection Time: 11/28/17 11:31 PM  Result Value Ref Range   Procalcitonin <0.10 ng/mL    Comment:        Interpretation: PCT (Procalcitonin) <= 0.5 ng/mL: Systemic infection (sepsis) is not likely. Local bacterial infection is possible. (NOTE)       Sepsis PCT Algorithm           Lower Respiratory Tract                                      Infection PCT Algorithm    ----------------------------     ----------------------------         PCT < 0.25 ng/mL                PCT < 0.10 ng/mL         Strongly encourage             Strongly discourage   discontinuation of antibiotics    initiation of antibiotics    ----------------------------     -----------------------------       PCT 0.25 - 0.50 ng/mL            PCT 0.10 - 0.25 ng/mL               OR       >80% decrease in PCT            Discourage initiation of                                            antibiotics      Encourage discontinuation           of antibiotics    ----------------------------     -----------------------------         PCT >= 0.50 ng/mL              PCT 0.26 - 0.50 ng/mL               AND        <80% decrease in PCT             Encourage initiation of                                             antibiotics       Encourage  continuation           of antibiotics    ----------------------------     -----------------------------        PCT >= 0.50 ng/mL                  PCT >  0.50 ng/mL               AND         increase in PCT                  Strongly encourage                                      initiation of antibiotics    Strongly encourage escalation           of antibiotics                                     -----------------------------                                           PCT <= 0.25 ng/mL                                                 OR                                        > 80% decrease in PCT                                     Discontinue / Do not initiate                                             antibiotics Performed at [facility], [address] [place], [place] [address]   Troponin Brendan     Status: None   Collection Time: 11/28/17 11:31 PM  Result Value Ref Range   Troponin Brendan <0.03 <0.03 ng/mL    Comment: Performed at [facility], [address], [place], [place] [address]  Magnesium     Status: Abnormal   Collection Time: 11/28/17 11:31 PM  Result Value Ref Range   Magnesium 1.5 (L) 1.7 - 2.4 mg/dL    Comment: Performed at [facility], [address] [place], [place] [address]  Phosphorus     Status: None   Collection Time: 11/28/17 11:31 PM  Result Value Ref Range   Phosphorus 3.6 2.5 - 4.6 mg/dL    Comment: Performed at [facility], [address] [place], [place] [address]  Blood Culture (routine x 2)     Status: None (Preliminary result)   Collection Time: 11/29/17  1:15 AM  Result Value Ref Range   Specimen Description BLOOD RIGHT WRIST    Special Requests      BOTTLES DRAWN AEROBIC ONLY Blood Culture results may not be optimal due to an inadequate volume of blood received in culture bottles   Culture      NO GROWTH < 12 HOURS Performed at [facility], [address] [place], [place] [address]    Report  Status PENDING   Lactic acid, plasma     Status: Abnormal   Collection Time: 11/29/17  1:38 AM  Result Value Ref Range   Lactic Acid, Venous 3.9 (HH) 0.5 - 1.9 mmol/L    Comment: CRITICAL RESULT CALLED TO, READ BACK BY AND VERIFIED WITH: MICHELLE ROGERS ON 11/29/17 AT 0211 [facility] Performed at [facility], [address] [place], [place] [address]   Glucose, capillary     Status: None   Collection Time: 11/29/17  8:04 AM  Result Value Ref Range   Glucose-Capillary 91 70 - 99 mg/dL  Glucose, capillary     Status: Abnormal   Collection Time: 11/29/17 11:54 AM  Result Value Ref Range   Glucose-Capillary 146 (H) 70 - 99 mg/dL  Glucose, capillary     Status: Abnormal   Collection Time: 11/29/17  4:53 PM  Result Value Ref Range   Glucose-Capillary 163 (H) 70 - 99 mg/dL    Dg Thoracic Spine 2 View  Result Date: 11/29/2017 CLINICAL DATA:  Acute mid back pain following fall yesterday. Initial encounter. EXAM: THORACIC SPINE 2 VIEWS COMPARISON:  07/28/2017 and prior radiographs FINDINGS: No acute fracture or subluxation identified. Compression fractures of T2, T3 and T12 are unchanged. Mild multilevel degenerative disc disease noted. No suspicious focal bony abnormalities are present. IMPRESSION: 1. No acute abnormality 2. Chronic compression fractures of T2, T3 and T12. Electronically Signed   By: Margarette Canada M.D.   On: 11/29/2017 14:49   Dg Lumbar Spine 2-3 Views  Result Date: 11/29/2017 CLINICAL DATA:  Low back pain after fall yesterday. EXAM: LUMBAR SPINE - 2-3 VIEW COMPARISON:  CT scan of August 10, 2016. Radiograph of May 10, 2017. FINDINGS: Stable appearance of old T12 and L2 compression fractures is noted. No acute fracture or spondylolisthesis is noted. Atherosclerosis of abdominal aorta is noted. Disc spaces are well-maintained IMPRESSION: Stable old T12 and L2 compression fractures. No acute abnormality seen. Electronically Signed   By: Marijo Conception, M.D.   On: 11/29/2017  14:47   Ct Chest Wo Contrast  Result Date: 11/29/2017 CLINICAL DATA:  Fall. Right shoulder blade pain. Shortness of breath EXAM: CT CHEST WITHOUT CONTRAST TECHNIQUE: Multidetector CT imaging of the chest was performed following the standard protocol without IV contrast. COMPARISON:  Chest x-ray 11/28/2017.  Chest CT 08/10/2016 FINDINGS: Cardiovascular: Diffuse severe coronary artery calcifications. Moderate aortic calcifications. No aneurysm. Cardiomegaly. Mediastinum/Nodes: Scattered borderline sized mediastinal lymph nodes. No axillary or visible hilar adenopathy. Lungs/Pleura: Moderate to large left effusion and small right effusion. Compressive atelectasis in the lower lobes, left greater than right. Upper Abdomen: Diffuse fatty infiltration of the liver. Nonobstructing renal stones noted in the kidneys bilaterally. No visible hydronephrosis. Musculoskeletal: Chest wall soft tissues are unremarkable. Right lateral 5th rib fracture noted, age indeterminate. No visible scapular abnormality. IMPRESSION: Moderate to large left effusion and small right effusion. Compressive atelectasis in the lower lobes. Cardiomegaly. Diffuse severe coronary artery disease. Moderate aortic atherosclerosis. Age-indeterminate right lateral 5th rib fracture. Electronically Signed   By: Rolm Baptise M.D.   On: 11/29/2017 08:46   Dg Chest Portable 1 View  Result Date: 11/28/2017 CLINICAL DATA:  Chest pain and syncope EXAM: PORTABLE CHEST 1 VIEW COMPARISON:  07/28/2017, 05/10/2017 FINDINGS: Low lung volumes. Right lung is clear. Moderate left pleural effusion. Cardiomegaly. Dense airspace disease at the lingula and left base. IMPRESSION: Moderate left pleural effusion with atelectasis or pneumonia at the lingula  and left base. Cardiomegaly. Electronically Signed   By: Donavan Foil M.D.   On: 11/28/2017 22:02    Review of Systems  Constitutional: Positive for diaphoresis and malaise/fatigue.  HENT: Positive for congestion.    Eyes: Negative.   Respiratory: Positive for shortness of breath.   Cardiovascular: Positive for chest pain, orthopnea, leg swelling and PND.  Gastrointestinal: Negative.   Genitourinary: Negative.   Musculoskeletal: Positive for myalgias.  Skin: Negative.   Neurological: Positive for weakness.  Endo/Heme/Allergies: Negative.   Psychiatric/Behavioral: Negative.    Blood pressure (!) 142/97, pulse (!) 128, temperature 97.8 F (36.6 C), temperature source Oral, resp. rate 18, height 5' 9"  (1.753 m), weight 98 kg, SpO2 92 %. Physical Exam  Nursing note and vitals reviewed. Constitutional: He is oriented to person, place, and time. He appears well-developed and well-nourished.  HENT:  Head: Normocephalic and atraumatic.  Eyes: Pupils are equal, round, and reactive to light. Conjunctivae and EOM are normal.  Neck: Normal range of motion. Neck supple.  Cardiovascular: Normal rate, S1 normal and S2 normal. An irregularly irregular rhythm present. PMI is displaced.  Murmur heard.  Systolic murmur is present with a grade of 2/6. Respiratory: Effort normal. He has decreased breath sounds. He has rhonchi.  GI: Soft. Bowel sounds are normal.  Musculoskeletal: Normal range of motion. He exhibits edema.  Neurological: He is alert and oriented to person, place, and time. He has normal reflexes.  Skin: Skin is warm and dry.  Psychiatric: He has a normal mood and affect.    Assessment/Plan: Sepsis Pneumonia Edema Atrial fibrillation Smoking Obesity Diabetes Hypertension Congestive heart failure diastolic dysfunction Alcohol abuse Obstructive sleep apnea GERD . Plan agree with admit rule out myocardial infarction Agree with broad-spectrum antibiotic therapy for pneumonia Recommend 02 supplemental oxygen therapy History of alcohol abuse advised patient refrain from alcohol Anticoagulation for atrial fibrillation is patient refrain from alcohol Eliquis therapy for anticoagulation in  addition to digoxin Continue heart failure therapy Obstructive sleep apnea CPAP oxygen Echocardiogram will be helpful for further assessment  Dwayne D Callwood 11/29/2017, 5:01 PM

## 2017-11-30 ENCOUNTER — Other Ambulatory Visit: Payer: Self-pay

## 2017-11-30 LAB — CBC
HCT: 47.3 % (ref 40.0–52.0)
Hemoglobin: 16.1 g/dL (ref 13.0–18.0)
MCH: 36.5 pg — AB (ref 26.0–34.0)
MCHC: 34 g/dL (ref 32.0–36.0)
MCV: 107.4 fL — AB (ref 80.0–100.0)
PLATELETS: 169 10*3/uL (ref 150–440)
RBC: 4.4 MIL/uL (ref 4.40–5.90)
RDW: 15.7 % — AB (ref 11.5–14.5)
WBC: 10 10*3/uL (ref 3.8–10.6)

## 2017-11-30 LAB — GLUCOSE, CAPILLARY
GLUCOSE-CAPILLARY: 131 mg/dL — AB (ref 70–99)
GLUCOSE-CAPILLARY: 114 mg/dL — AB (ref 70–99)
Glucose-Capillary: 130 mg/dL — ABNORMAL HIGH (ref 70–99)
Glucose-Capillary: 145 mg/dL — ABNORMAL HIGH (ref 70–99)
Glucose-Capillary: 131 mg/dL — ABNORMAL HIGH (ref 70–99)
Glucose-Capillary: 143 mg/dL — ABNORMAL HIGH (ref 70–99)

## 2017-11-30 LAB — CALCIUM, IONIZED: CALCIUM, IONIZED, SERUM: 4.3 mg/dL — AB (ref 4.5–5.6)

## 2017-11-30 LAB — BASIC METABOLIC PANEL
Anion gap: 9 (ref 5–15)
BUN: 19 mg/dL (ref 8–23)
CO2: 31 mmol/L (ref 22–32)
CREATININE: 1.06 mg/dL (ref 0.61–1.24)
Calcium: 8.3 mg/dL — ABNORMAL LOW (ref 8.9–10.3)
Chloride: 98 mmol/L (ref 98–111)
GFR calc Af Amer: >60 mL/min (ref >60)
GFR calc non Af Amer: >60 mL/min (ref >60)
GLUCOSE: 131 mg/dL — AB (ref 70–99)
Potassium: 4.5 mmol/L (ref 3.5–5.1)
Sodium: 138 mmol/L (ref 135–145)

## 2017-11-30 LAB — HIV ANTIBODY (ROUTINE TESTING W REFLEX): HIV Screen 4th Generation wRfx: NONREACTIVE

## 2017-11-30 LAB — PROCALCITONIN: Procalcitonin: <0.1 ng/mL

## 2017-11-30 MED ORDER — DOXYCYCLINE HYCLATE 100 MG PO TABS
100.0000 mg | ORAL_TABLET | Freq: Two times a day (BID) | ORAL | Status: DC
  Administered 2017-11-30 – 2017-12-06 (×13): 100 mg via ORAL
  Filled 2017-11-30 (×13): qty 1

## 2017-11-30 MED ORDER — TRAZODONE HCL 50 MG PO TABS
50.0000 mg | ORAL_TABLET | Freq: Every day | ORAL | Status: DC
  Administered 2017-11-30 – 2017-12-06 (×8): 50 mg via ORAL
  Filled 2017-11-30 (×8): qty 1

## 2017-11-30 MED ORDER — ADULT MULTIVITAMIN W/MINERALS CH
1.0000 | ORAL_TABLET | Freq: Every day | ORAL | Status: DC
  Administered 2017-12-01 – 2017-12-07 (×7): 1 via ORAL
  Filled 2017-11-30 (×6): qty 1

## 2017-11-30 MED ORDER — SODIUM CHLORIDE 0.9 % IV SOLN
1.0000 g | INTRAVENOUS | Status: DC
  Administered 2017-11-30 – 2017-12-05 (×6): 1 g via INTRAVENOUS
  Filled 2017-11-30: qty 1
  Filled 2017-11-30: qty 10
  Filled 2017-11-30 (×5): qty 1

## 2017-11-30 MED ORDER — AMIODARONE HCL 200 MG PO TABS
200.0000 mg | ORAL_TABLET | Freq: Two times a day (BID) | ORAL | Status: DC
  Administered 2017-11-30 – 2017-12-07 (×15): 200 mg via ORAL
  Filled 2017-11-30 (×15): qty 1

## 2017-11-30 NOTE — Progress Notes (Signed)
[place] [facility] [place] NAME: Brendan Edwards    MR#:  [id number]  DATE OF BIRTH:  [birth year]  SUBJECTIVE:  admitted because of fall and now has severe upper back pain requiring multiple doses of IV pain medicine.  Also found to have sepsis with pneumonia, A. fib with RVR when he came.  CHIEF COMPLAINT:  No chief complaint on file.   REVIEW OF SYSTEMS:    Review of Systems  Constitutional: Negative for chills and fever.  HENT: Negative for hearing loss.   Eyes: Negative for blurred vision, double vision and photophobia.  Respiratory: Positive for cough. Negative for hemoptysis and shortness of breath.   Cardiovascular: Negative for chest pain, palpitations, orthopnea and leg swelling.  Gastrointestinal: Negative for abdominal pain, diarrhea and vomiting.  Genitourinary: Negative for dysuria and urgency.  Musculoskeletal: Positive for back pain. Negative for myalgias and neck pain.  Skin: Negative for rash.  Neurological: Positive for headaches. Negative for dizziness, focal weakness, seizures and weakness.  Psychiatric/Behavioral: Negative for memory loss. The patient does not have insomnia.     Nutrition: Tolerating Diet: Tolerating PT:      DRUG ALLERGIES:   Allergies  Allergen Reactions  . Oxycodone Other (See Comments)    Hallucinations  . Cardura [Doxazosin Mesylate]   . Darvon [Propoxyphene]   . Penicillins     Has patient had a PCN reaction causing immediate rash, facial/tongue/throat swelling, SOB or lightheadedness with hypotension: Yes Has patient had a PCN reaction causing severe rash involving mucus membranes or skin necrosis: No Has patient had a PCN reaction that required hospitalization: No Has patient had a PCN reaction occurring within the last 10 years: No If all of the above answers are "NO", then may proceed with Cephalosporin use.  Marland Kitchen Requip [Ropinirole Hcl]   . Septra [Sulfamethoxazole-Trimethoprim]      VITALS:  Blood pressure 109/74, pulse 95, temperature 98.2 F (36.8 C), temperature source Oral, resp. rate 18, height 5\' 9"  (1.753 m), weight 99.3 kg, SpO2 94 %.  PHYSICAL EXAMINATION:   Physical Exam  GENERAL:  72 y.o.-year-old patient lying in the bed with no acute distress.  Mildly obese. EYES: Pupils equal, round, reactive to light and accommodation. No scleral icterus. Extraocular muscles intact.  HEENT: Head atraumatic, normocephalic. Oropharynx and nasopharynx clear.  NECK:  Supple, no jugular venous distention. No thyroid enlargement, no tenderness.  LUNGS: Diminished air entry bilaterally cARDIOVASCULAR: S1, S2 irregularly irregular.. No murmurs, rubs, or gallops.  ABDOMEN: Soft, nontender, nondistended. Bowel sounds present. No organomegaly or mass.  EXTREMITIES: Chronic venous stasis. NEUROLOGIC: Cranial nerves II through XII are intact. Muscle strength 5/5 in all extremities. Sensation intact. Gait not checked.  PSYCHIATRIC: The patient is alert and oriented x 3.  SKIN: No obvious rash, lesion, or ulcer.    LABORATORY PANEL:   CBC Recent Labs  Lab 11/30/17 0443  WBC 10.0  HGB 16.1  HCT 47.3  PLT 169   ------------------------------------------------------------------------------------------------------------------  Chemistries  Recent Labs  Lab 11/28/17 2331 11/30/17 0443  NA 140 138  K 3.4* 4.5  CL 96* 98  CO2 26 31  GLUCOSE 104* 131*  BUN 10 19  CREATININE 0.88 1.06  CALCIUM 8.4* 8.3*  MG 1.5*  --   AST 38  --   ALT 25  --   ALKPHOS 163*  --   BILITOT 1.7*  --    ------------------------------------------------------------------------------------------------------------------  Cardiac Enzymes Recent Labs  Lab 11/28/17 2331  TROPONINI <  0.03   ------------------------------------------------------------------------------------------------------------------  RADIOLOGY:  Dg Thoracic Spine 2 View  Result Date: 11/29/2017 CLINICAL DATA:   Acute mid back pain following fall yesterday. Initial encounter. EXAM: THORACIC SPINE 2 VIEWS COMPARISON:  07/28/2017 and prior radiographs FINDINGS: No acute fracture or subluxation identified. Compression fractures of T2, T3 and T12 are unchanged. Mild multilevel degenerative disc disease noted. No suspicious focal bony abnormalities are present. IMPRESSION: 1. No acute abnormality 2. Chronic compression fractures of T2, T3 and T12. Electronically Signed   By: Margarette Canada M.D.   On: 11/29/2017 14:49   Dg Lumbar Spine 2-3 Views  Result Date: 11/29/2017 CLINICAL DATA:  Low back pain after fall yesterday. EXAM: LUMBAR SPINE - 2-3 VIEW COMPARISON:  CT scan of August 10, 2016. Radiograph of May 10, 2017. FINDINGS: Stable appearance of old T12 and L2 compression fractures is noted. No acute fracture or spondylolisthesis is noted. Atherosclerosis of abdominal aorta is noted. Disc spaces are well-maintained IMPRESSION: Stable old T12 and L2 compression fractures. No acute abnormality seen. Electronically Signed   By: Marijo Conception, M.D.   On: 11/29/2017 14:47   Ct Chest Wo Contrast  Result Date: 11/29/2017 CLINICAL DATA:  Fall. Right shoulder blade pain. Shortness of breath EXAM: CT CHEST WITHOUT CONTRAST TECHNIQUE: Multidetector CT imaging of the chest was performed following the standard protocol without IV contrast. COMPARISON:  Chest x-ray 11/28/2017.  Chest CT 08/10/2016 FINDINGS: Cardiovascular: Diffuse severe coronary artery calcifications. Moderate aortic calcifications. No aneurysm. Cardiomegaly. Mediastinum/Nodes: Scattered borderline sized mediastinal lymph nodes. No axillary or visible hilar adenopathy. Lungs/Pleura: Moderate to large left effusion and small right effusion. Compressive atelectasis in the lower lobes, left greater than right. Upper Abdomen: Diffuse fatty infiltration of the liver. Nonobstructing renal stones noted in the kidneys bilaterally. No visible hydronephrosis.  Musculoskeletal: Chest wall soft tissues are unremarkable. Right lateral 5th rib fracture noted, age indeterminate. No visible scapular abnormality. IMPRESSION: Moderate to large left effusion and small right effusion. Compressive atelectasis in the lower lobes. Cardiomegaly. Diffuse severe coronary artery disease. Moderate aortic atherosclerosis. Age-indeterminate right lateral 5th rib fracture. Electronically Signed   By: Rolm Baptise M.D.   On: 11/29/2017 08:46   Dg Chest Portable 1 View  Result Date: 11/28/2017 CLINICAL DATA:  Chest pain and syncope EXAM: PORTABLE CHEST 1 VIEW COMPARISON:  07/28/2017, 05/10/2017 FINDINGS: Low lung volumes. Right lung is clear. Moderate left pleural effusion. Cardiomegaly. Dense airspace disease at the lingula and left base. IMPRESSION: Moderate left pleural effusion with atelectasis or pneumonia at the lingula and left base. Cardiomegaly. Electronically Signed   By: Donavan Foil M.D.   On: 11/28/2017 22:02     ASSESSMENT AND PLAN:   Active Problems:   Acute hypoxemic respiratory failure (HCC)  Acute respiratory failure with hypoxia secondary to left pleural effusion, pneumonia: Clinically improving with IV antibiotics,  on 3 L of oxygen and saturation 94%. 2.  A. fib with RVR, initially required amiodarone drip, now on amiodarone 200 mg every 12 hours, small dose beta-blockers, heart rate is better around 70s to 80 bpm.  And blood pressure also is stable.  Followed by Dr. Shary Key had echocardiogram results are pending, continue Eliquis for full dose anticoagulation.,  Patient had echo done in April 2019 showed ejection fraction more than 55%.   3. patient has chronic venous stasis, discolored distal portion of both legs there is therefore left several years, wound care nurse recommended ABIs but vascular department told me that ABIs are done as an outpatient  cannot do as an inpatient.  Patient can follow-up with vascular as an outpatient for Unna boot  changes weekly.  Unable to get ABIs done and then patient as per ultrasound tech ABIs are not done inpatient..   All the records are reviewed and case discussed with Care Management/Social Workerr. Management plans discussed with the patient, family and they are in agreement.  CODE STATUS' full code  TOTAL TIME TAKING CARE OF THIS PATIENT: 40  minutes.  Than 50% of time spent in counseling, coordination of care  POSSIBLE D/C IN 1-2DAYS, DEPENDING ON CLINICAL CONDITION.   Epifanio Lesches M.D on 11/30/2017 at 2:03 PM  Between 7am to 6pm - Pager - [phone]  After 6pm go to [email or web address] - password [email or web address] [facility]  Office  [phone]  CC: Primary care physician; Tracie Harrier, MD

## 2017-11-30 NOTE — Progress Notes (Signed)
PHARMACIST - PHYSICIAN COMMUNICATION DR:   Vianne Bulls CONCERNING: Antibiotic IV to Oral Route Change Policy  RECOMMENDATION: This patient is receiving doxycycline by the intravenous route.  Based on criteria approved by the Pharmacy and Therapeutics Committee, the antibiotic(s) is/are being converted to the equivalent oral dose form(s).   DESCRIPTION: These criteria include:  Patient being treated for a respiratory tract infection, urinary tract infection, cellulitis or clostridium difficile associated diarrhea if on metronidazole  The patient is not neutropenic and does not exhibit a GI malabsorption state  The patient is eating (either orally or via tube) and/or has been taking other orally administered medications for a least 24 hours  The patient is improving clinically and has a Tmax < 100.5  If you have questions about this conversion, please contact the Pharmacy Department  []   [phone] )  [facility] [x]   [phone] )  [facility] []   [phone] )  [facility] []   [phone] )  [facility] []   [phone] )  [facility]   Will also adjust ceftriaxone dosing to 1 g daily in non critically ill patient with possible PNA.   Ulice Dash, PharmD Clinical Pharmacist

## 2017-11-30 NOTE — Consult Note (Signed)
Reviewed chart for ABI results, noted that at this campus they do not perform inpatient ABIs.  Will need to follow up with PCP or vascular for ABI and subsequent placement of compression if deemed appropriate and safe for this patient.  Until then will continue silicone foam to weeping ulcerations and elevation for edema control.  Discussed POC with patient and bedside nurse.  Re consult if needed, will not follow at this time. Thanks  Edgardo Petrenko [employer], RN,CWOCN, CNS, [place] [phone])

## 2017-12-01 ENCOUNTER — Inpatient Hospital Stay: Payer: PPO

## 2017-12-01 LAB — PROCALCITONIN: Procalcitonin: 0.12 ng/mL

## 2017-12-01 LAB — GLUCOSE, CAPILLARY
GLUCOSE-CAPILLARY: 126 mg/dL — AB (ref 70–99)
GLUCOSE-CAPILLARY: 96 mg/dL (ref 70–99)
Glucose-Capillary: 110 mg/dL — ABNORMAL HIGH (ref 70–99)
Glucose-Capillary: 131 mg/dL — ABNORMAL HIGH (ref 70–99)

## 2017-12-01 MED ORDER — DOCUSATE SODIUM 100 MG PO CAPS
100.0000 mg | ORAL_CAPSULE | Freq: Two times a day (BID) | ORAL | Status: DC
  Administered 2017-12-01 – 2017-12-06 (×10): 100 mg via ORAL
  Filled 2017-12-01 (×11): qty 1

## 2017-12-01 MED ORDER — POLYETHYLENE GLYCOL 3350 17 G PO PACK
17.0000 g | PACK | Freq: Every day | ORAL | Status: DC
  Administered 2017-12-01 – 2017-12-06 (×4): 17 g via ORAL
  Filled 2017-12-01 (×4): qty 1

## 2017-12-01 NOTE — Care Management Important Message (Signed)
Copy of signed IM left with patient in room.  

## 2017-12-01 NOTE — Progress Notes (Signed)
[place] at [place] NAME: Brendan Edwards    MR#:  [id number]  DATE OF BIRTH:  [birth year]  Patient is feeling better but still has upper  back pain but no shortness of breath.  Constipation.  MRI of T-spine is ordered.  CHIEF COMPLAINT:  No chief complaint on file.   REVIEW OF SYSTEMS:    Review of Systems  Constitutional: Negative for chills and fever.  HENT: Negative for hearing loss.   Eyes: Negative for blurred vision, double vision and photophobia.  Respiratory: Positive for cough. Negative for hemoptysis and shortness of breath.   Cardiovascular: Negative for chest pain, palpitations, orthopnea and leg swelling.  Gastrointestinal: Positive for constipation. Negative for abdominal pain, diarrhea and vomiting.  Genitourinary: Negative for dysuria and urgency.  Musculoskeletal: Positive for back pain. Negative for myalgias and neck pain.  Skin: Negative for rash.  Neurological: Negative for dizziness, focal weakness, seizures, weakness and headaches.  Psychiatric/Behavioral: Negative for memory loss. The patient does not have insomnia.     Nutrition: Tolerating Diet: Tolerating PT:      DRUG ALLERGIES:   Allergies  Allergen Reactions  . Oxycodone Other (See Comments)    Hallucinations  . Cardura [Doxazosin Mesylate]   . Darvon [Propoxyphene]   . Penicillins     Has patient had a PCN reaction causing immediate rash, facial/tongue/throat swelling, SOB or lightheadedness with hypotension: Yes Has patient had a PCN reaction causing severe rash involving mucus membranes or skin necrosis: No Has patient had a PCN reaction that required hospitalization: No Has patient had a PCN reaction occurring within the last 10 years: No If all of the above answers are "NO", then may proceed with Cephalosporin use.  Marland Kitchen Requip [Ropinirole Hcl]   . Septra [Sulfamethoxazole-Trimethoprim]     VITALS:  Blood pressure (!) 105/55, pulse 66,  temperature (!) 97.5 F (36.4 C), temperature source Oral, resp. rate 16, height 5\' 9"  (1.753 m), weight 101.9 kg, SpO2 96 %.  PHYSICAL EXAMINATION:   Physical Exam  GENERAL:  72 y.o.-year-old patient lying in the bed with no acute distress.  Mildly obese. EYES: Pupils equal, round, reactive to light and accommodation. No scleral icterus. Extraocular muscles intact.  HEENT: Head atraumatic, normocephalic. Oropharynx and nasopharynx clear.  NECK:  Supple, no jugular venous distention. No thyroid enlargement, no tenderness.  LUNGS: Diminished air entry bilaterally cARDIOVASCULAR: S1, S2 irregularly irregular.. No murmurs, rubs, or gallops.  ABDOMEN: Soft, nontender, nondistended. Bowel sounds present. No organomegaly or mass.  EXTREMITIES: Chronic venous stasis. NEUROLOGIC: Cranial nerves II through XII are intact. Muscle strength 5/5 in all extremities. Sensation intact. Gait not checked.  PSYCHIATRIC: The patient is alert and oriented x 3.  SKIN: No obvious rash, lesion, or ulcer.    LABORATORY PANEL:   CBC Recent Labs  Lab 11/30/17 0443  WBC 10.0  HGB 16.1  HCT 47.3  PLT 169   ------------------------------------------------------------------------------------------------------------------  Chemistries  Recent Labs  Lab 11/28/17 2331 11/30/17 0443  NA 140 138  K 3.4* 4.5  CL 96* 98  CO2 26 31  GLUCOSE 104* 131*  BUN 10 19  CREATININE 0.88 1.06  CALCIUM 8.4* 8.3*  MG 1.5*  --   AST 38  --   ALT 25  --   ALKPHOS 163*  --   BILITOT 1.7*  --    ------------------------------------------------------------------------------------------------------------------  Cardiac Enzymes Recent Labs  Lab 11/28/17 2331  TROPONINI <0.03   ------------------------------------------------------------------------------------------------------------------  RADIOLOGY:  Dg  Thoracic Spine 2 View  Result Date: 11/29/2017 CLINICAL DATA:  Acute mid back pain following fall  yesterday. Initial encounter. EXAM: THORACIC SPINE 2 VIEWS COMPARISON:  07/28/2017 and prior radiographs FINDINGS: No acute fracture or subluxation identified. Compression fractures of T2, T3 and T12 are unchanged. Mild multilevel degenerative disc disease noted. No suspicious focal bony abnormalities are present. IMPRESSION: 1. No acute abnormality 2. Chronic compression fractures of T2, T3 and T12. Electronically Signed   By: Margarette Canada M.D.   On: 11/29/2017 14:49   Dg Lumbar Spine 2-3 Views  Result Date: 11/29/2017 CLINICAL DATA:  Low back pain after fall yesterday. EXAM: LUMBAR SPINE - 2-3 VIEW COMPARISON:  CT scan of August 10, 2016. Radiograph of May 10, 2017. FINDINGS: Stable appearance of old T12 and L2 compression fractures is noted. No acute fracture or spondylolisthesis is noted. Atherosclerosis of abdominal aorta is noted. Disc spaces are well-maintained IMPRESSION: Stable old T12 and L2 compression fractures. No acute abnormality seen. Electronically Signed   By: Marijo Conception, M.D.   On: 11/29/2017 14:47     ASSESSMENT AND PLAN:   Active Problems:   Acute hypoxemic respiratory failure (HCC)  Acute respiratory failure with hypoxia secondary to left pleural effusion, pneumonia: Clinically improving with IV antibiotics,  on 3 L of oxygen and saturation 94%.  Continue Rocephin, doxycycline today. 2.  A. fib with RVR, initially required amiodarone drip, now on amiodarone 200 mg every 12 hours, small dose beta-blockers, heart rate is better around 70s to 80 bpm.  And blood pressure also is stable.  Followed by Dr. Shary Key had echocardiogram results are pending, continue Eliquis for full dose anticoagulation.,  Patient had echo done in April 2019 showed ejection fraction more than 55%.   3. patient has chronic venous stasis, AV as an outpatient, continue dressing changes as per wound care recommendation 4.  Upper back pain with chronic T2, T3, T12 compression fractures, MRI of  the thoracic spine ordered, patient seen by Dr. Rudene Christians yesterday, recommended MRI of T-spine, patient went for MRI of T-spine today. #5 constipation, continue stool softeners today.  All the records are reviewed and case discussed with Care Management/Social Workerr. Management plans discussed with the patient, family and they are in agreement.  CODE STATUS' full code  TOTAL TIME TAKING CARE OF THIS PATIENT: 40  minutes.  Than 50% of time spent in counseling, coordination of care  POSSIBLE D/C IN 1-2DAYS, DEPENDING ON CLINICAL CONDITION.   Epifanio Lesches M.D on 12/01/2017 at 2:23 PM  Between 7am to 6pm - Pager - [phone]  After 6pm go to [email or web address] - password [email or web address] [facility]  Office  [phone]  CC: Primary care physician; Tracie Harrier, MD

## 2017-12-02 LAB — GLUCOSE, CAPILLARY
Glucose-Capillary: 115 mg/dL — ABNORMAL HIGH (ref 70–99)
Glucose-Capillary: 148 mg/dL — ABNORMAL HIGH (ref 70–99)
Glucose-Capillary: 157 mg/dL — ABNORMAL HIGH (ref 70–99)
Glucose-Capillary: 136 mg/dL — ABNORMAL HIGH (ref 70–99)

## 2017-12-02 LAB — BASIC METABOLIC PANEL
ANION GAP: 10 (ref 5–15)
BUN: 22 mg/dL (ref 8–23)
CALCIUM: 8.7 mg/dL — AB (ref 8.9–10.3)
CO2: 28 mmol/L (ref 22–32)
Chloride: 94 mmol/L — ABNORMAL LOW (ref 98–111)
Creatinine, Ser: 0.94 mg/dL (ref 0.61–1.24)
Glucose, Bld: 118 mg/dL — ABNORMAL HIGH (ref 70–99)
Potassium: 4.2 mmol/L (ref 3.5–5.1)
Sodium: 132 mmol/L — ABNORMAL LOW (ref 135–145)

## 2017-12-02 LAB — ECHOCARDIOGRAM COMPLETE
Height: 69 in
WEIGHTICAEL: 3456 [oz_av]

## 2017-12-02 LAB — URINALYSIS, ROUTINE W REFLEX MICROSCOPIC
BILIRUBIN URINE: NEGATIVE
Bacteria, UA: NONE SEEN
GLUCOSE, UA: NEGATIVE mg/dL
HGB URINE DIPSTICK: NEGATIVE
KETONES UR: NEGATIVE mg/dL
LEUKOCYTES UA: NEGATIVE
NITRITE: NEGATIVE
PH: 6 (ref 5.0–8.0)
Protein, ur: NEGATIVE mg/dL
Specific Gravity, Urine: 1.006 (ref 1.005–1.030)
Squamous Epithelial / LPF: NONE SEEN (ref 0–5)

## 2017-12-02 MED ORDER — HYDROCODONE-ACETAMINOPHEN 5-325 MG PO TABS
1.0000 | ORAL_TABLET | ORAL | Status: DC | PRN
  Administered 2017-12-02 – 2017-12-03 (×3): 2 via ORAL
  Administered 2017-12-04: 1 via ORAL
  Administered 2017-12-05 – 2017-12-06 (×2): 2 via ORAL
  Administered 2017-12-07: 1 via ORAL
  Filled 2017-12-02 (×4): qty 2
  Filled 2017-12-02: qty 1
  Filled 2017-12-02: qty 2
  Filled 2017-12-02: qty 1
  Filled 2017-12-02: qty 2

## 2017-12-02 MED ORDER — ACETAMINOPHEN 325 MG PO TABS: 650.0000 mg | ORAL_TABLET | Freq: Four times a day (QID) | ORAL | Status: DC | PRN

## 2017-12-02 MED ORDER — LIDOCAINE 5 % EX PTCH
1.0000 | MEDICATED_PATCH | CUTANEOUS | Status: DC
  Administered 2017-12-02 – 2017-12-07 (×6): 1 via TRANSDERMAL
  Filled 2017-12-02 (×6): qty 1

## 2017-12-02 MED ORDER — BISACODYL 10 MG RE SUPP
10.0000 mg | Freq: Every day | RECTAL | Status: DC | PRN
  Administered 2017-12-03: 10 mg via RECTAL
  Filled 2017-12-02: qty 1

## 2017-12-02 MED ORDER — OXYCODONE-ACETAMINOPHEN 7.5-325 MG PO TABS
1.0000 | ORAL_TABLET | ORAL | Status: DC | PRN
  Administered 2017-12-02: 1 via ORAL
  Filled 2017-12-02: qty 1

## 2017-12-02 NOTE — Progress Notes (Signed)
Talked to Dr. Brett Albino about patient's order for percocet and has allergies with oxycodone. Patient had a dose of percocet at 1307 and he just said it made him feel "loopy". Dr Brett Albino change the order for Norco and has tylenol PRN. Also patient states he uses CPAP at home, order given. RN will continue to monitor.

## 2017-12-02 NOTE — Progress Notes (Signed)
Talked to Dr. Jannifer Franklin if patient can have suppository, patient's last BM was last Sunday per patient. He is receiving colace and Miralax. Order place. RN will continue to monitor.

## 2017-12-02 NOTE — Progress Notes (Signed)
Patient experiencing pain in back.  Lidoderm patch placed and pain medications were changed today to hopefully get better control of pain.  Patient sat on side of the bed today for a short time.

## 2017-12-02 NOTE — Progress Notes (Signed)
Patient has order to check urinalysis, strep pneumoniae urinary antigen and legionella pneumophila urine that was never sent, re-order labs to be check and sent sample to lab. RN will continue to monitor.

## 2017-12-02 NOTE — Progress Notes (Signed)
Patient has stable old thoracic compression fractures.  New compression fracture at L2 with new compression, not sure this is cause of mid back pain.

## 2017-12-02 NOTE — Progress Notes (Signed)
[place] [facility] [place] NAME: Brendan Edwards    MR#:  [id number]  DATE OF BIRTH:  1946-04-01  Has severe upper back pain, MRI of thoracic spine showed old fractures but nothing acute, Dr. Rudene Christians recommended no kyphoplasty but recommended conservative treatment.  Patient shortness of breath is improved.  Back pain is limiting his ambulation.  Continue IV Dilaudid, added Lidoderm patch today.  CHIEF COMPLAINT:  No chief complaint on file.  Patient is very hard of hearing. REVIEW OF SYSTEMS:    Review of Systems  Constitutional: Negative for chills and fever.  HENT: Positive for hearing loss.   Eyes: Negative for blurred vision, double vision and photophobia.  Respiratory: Positive for cough. Negative for hemoptysis and shortness of breath.   Cardiovascular: Negative for chest pain, palpitations, orthopnea and leg swelling.  Gastrointestinal: Positive for constipation. Negative for abdominal pain, diarrhea and vomiting.  Genitourinary: Negative for dysuria and urgency.  Musculoskeletal: Positive for back pain. Negative for myalgias and neck pain.  Skin: Negative for rash.  Neurological: Negative for dizziness, focal weakness, seizures, weakness and headaches.  Psychiatric/Behavioral: Negative for memory loss. The patient does not have insomnia.     Nutrition: Tolerating Diet: Tolerating PT:      DRUG ALLERGIES:   Allergies  Allergen Reactions  . Oxycodone Other (See Comments)    Hallucinations  . Cardura [Doxazosin Mesylate]   . Darvon [Propoxyphene]   . Penicillins     Has patient had a PCN reaction causing immediate rash, facial/tongue/throat swelling, SOB or lightheadedness with hypotension: Yes Has patient had a PCN reaction causing severe rash involving mucus membranes or skin necrosis: No Has patient had a PCN reaction that required hospitalization: No Has patient had a PCN reaction occurring within the last 10 years: No If  all of the above answers are "NO", then may proceed with Cephalosporin use.  Marland Kitchen Requip [Ropinirole Hcl]   . Septra [Sulfamethoxazole-Trimethoprim]     VITALS:  Blood pressure 131/76, pulse (!) 112, temperature 98.4 F (36.9 C), temperature source Oral, resp. rate 16, height 5\' 9"  (1.753 m), weight 101.6 kg, SpO2 91 %.  PHYSICAL EXAMINATION:   Physical Exam  GENERAL:  72 y.o.-year-old patient lying in the bed with no acute distress.  Mildly obese. EYES: Pupils equal, round, reactive to light and accommodation. No scleral icterus. Extraocular muscles intact.  HEENT: Head atraumatic, normocephalic. Oropharynx and nasopharynx clear.  NECK:  Supple, no jugular venous distention. No thyroid enlargement, no tenderness.  LUNGS: Diminished air entry bilaterally cARDIOVASCULAR: S1, S2 irregularly irregular.. No murmurs, rubs, or gallops.  ABDOMEN: Soft, nontender, nondistended. Bowel sounds present. No organomegaly or mass.  EXTREMITIES: Chronic venous stasis. NEUROLOGIC: Cranial nerves II through XII are intact. Muscle strength 5/5 in all extremities. Sensation intact. Gait not checked.  PSYCHIATRIC: The patient is alert and oriented x 3.  SKIN: No obvious rash, lesion, or ulcer.    LABORATORY PANEL:   CBC Recent Labs  Lab 11/30/17 0443  WBC 10.0  HGB 16.1  HCT 47.3  PLT 169   ------------------------------------------------------------------------------------------------------------------  Chemistries  Recent Labs  Lab 11/28/17 2331  12/02/17 0440  NA 140   < > 132*  K 3.4*   < > 4.2  CL 96*   < > 94*  CO2 26   < > 28  GLUCOSE 104*   < > 118*  BUN 10   < > 22  CREATININE 0.88   < > 0.94  CALCIUM 8.4*   < > 8.7*  MG 1.5*  --   --   AST 38  --   --   ALT 25  --   --   ALKPHOS 163*  --   --   BILITOT 1.7*  --   --    < > = values in this interval not displayed.    ------------------------------------------------------------------------------------------------------------------  Cardiac Enzymes Recent Labs  Lab 11/28/17 2331  TROPONINI <0.03   ------------------------------------------------------------------------------------------------------------------  RADIOLOGY:  Mr Thoracic Spine Wo Contrast  Result Date: 12/01/2017 CLINICAL DATA:  Upper back pain after fall 2 days ago. EXAM: MRI THORACIC SPINE WITHOUT CONTRAST TECHNIQUE: Multiplanar, multisequence MR imaging of the thoracic spine was performed. No intravenous contrast was administered. COMPARISON:  CT chest dated November 29, 2017. MRI thoracic spine dated October 09, 2016. FINDINGS: Alignment: Slightly exaggerated kyphosis in the upper thoracic spine. Vertebrae: There is an acute on chronic superior endplate fracture of L2 with associated marrow edema. There is no progressive height loss. Chronic T2, T3, and T12 compression fractures are again noted. 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 Unchanged bone islands in the T8 and T11 vertebral bodies. No evidence of discitis. Cord:  Normal signal and morphology. Paraspinal and other soft tissues: Moderate left and small right pleural effusions with adjacent lower lobe atelectasis, worse on the left. Disc levels: Scattered tiny central disc protrusions. No spinal canal or neuroforaminal stenosis. IMPRESSION: 1. Acute on chronic superior endplate fracture of L2 without progressive height loss. 2. Chronic T2, T3, and T12 compression fractures. 3. Moderate left and small right pleural effusions. Electronically Signed   By: Titus Dubin M.D.   On: 12/01/2017 15:34     ASSESSMENT AND PLAN:   Active Problems:   Acute hypoxemic respiratory failure (HCC)  Sepsis, acute respiratory failure with hypoxia secondary to left pleural effusion, pneumonia: Clinically improving with IV antibiotics,  on 3 L of oxygen  and saturation 94%.  Continue Rocephin, doxycycline today.  Repeat chest x-ray again for follow-up of early effusion, pneumonia 2.  A. fib with RVR, initially required amiodarone drip, now on amiodarone 200 mg every 12 hours, Cardizem, digoxin, still tachycardic heart rate is in upper 110s.  blood pressure also is stable.  Followed by Dr. Shary Key had echocardiogram results are pending, continue Eliquis for full dose anticoagulation.,  Patient had echo done in April 2019 showed ejection fraction more than 55%.   3. patient has chronic venous stasis, AV as an outpatient, continue dressing changes as per wound care recommendation 4.  Upper back pain with chronic T2, T3, T12 compression fractures, MRI of the thoracic spine he has concomitantly seen, Dr. Rudene Christians at did not recommend any kyphoplasty.  Conservative management advised, continue Dilaudid, added Percocet and Lidoderm patch.  Hopefully he can participate in physical therapy tomorrow tomorrow.. #5 . chronic nstipation, continue stool softeners today.  All the records are reviewed and case discussed with Care Management/Social Workerr. Management plans discussed with the patient, family and they are in agreement.  CODE STATUS' full code  TOTAL TIME TAKING CARE OF THIS PATIENT: 40  minutes.  Than 50% of time spent in counseling, coordination of care  POSSIBLE D/C IN 1-2DAYS, DEPENDING ON CLINICAL CONDITION.   Epifanio Lesches M.D on 12/02/2017 at 5:22 PM  Between 7am to 6pm - Pager - [phone]  After 6pm go to [email or web address] - [facility]  Office  [phone]  CC: Primary care physician; Tracie Harrier, MD

## 2017-12-02 NOTE — Progress Notes (Signed)
Talked to Dr. Vianne Bulls about patient's intolerance with oxycodone, he reports he has hallucination whenever he takes this medication. Patient on percocet, asked if we need to change this order, per MD patient is already taking Dilaudid. Patient had a dose of percocet at 1307, will continue to monitor.

## 2017-12-03 ENCOUNTER — Inpatient Hospital Stay: Payer: PPO

## 2017-12-03 LAB — BASIC METABOLIC PANEL WITH GFR
Anion gap: 11 (ref 5–15)
BUN: 17 mg/dL (ref 8–23)
CO2: 30 mmol/L (ref 22–32)
Calcium: 8.8 mg/dL — ABNORMAL LOW (ref 8.9–10.3)
Chloride: 94 mmol/L — ABNORMAL LOW (ref 98–111)
Creatinine, Ser: 0.79 mg/dL (ref 0.61–1.24)
GFR calc Af Amer: >60 mL/min
GFR calc non Af Amer: >60 mL/min
Glucose, Bld: 114 mg/dL — ABNORMAL HIGH (ref 70–99)
Potassium: 4 mmol/L (ref 3.5–5.1)
Sodium: 135 mmol/L (ref 135–145)

## 2017-12-03 LAB — GLUCOSE, CAPILLARY
Glucose-Capillary: 141 mg/dL — ABNORMAL HIGH (ref 70–99)
Glucose-Capillary: 181 mg/dL — ABNORMAL HIGH (ref 70–99)
Glucose-Capillary: 132 mg/dL — ABNORMAL HIGH (ref 70–99)
Glucose-Capillary: 135 mg/dL — ABNORMAL HIGH (ref 70–99)

## 2017-12-03 LAB — CULTURE, BLOOD (ROUTINE X 2)
CULTURE: NO GROWTH
Special Requests: ADEQUATE

## 2017-12-03 LAB — STREP PNEUMONIAE URINARY ANTIGEN: Strep Pneumo Urinary Antigen: NEGATIVE

## 2017-12-03 MED ORDER — FUROSEMIDE 10 MG/ML IJ SOLN
40.0000 mg | Freq: Two times a day (BID) | INTRAMUSCULAR | Status: DC
  Administered 2017-12-03 – 2017-12-06 (×6): 40 mg via INTRAVENOUS
  Filled 2017-12-03 (×6): qty 4

## 2017-12-03 MED ORDER — PREDNISONE 20 MG PO TABS
20.0000 mg | ORAL_TABLET | Freq: Every day | ORAL | Status: DC
  Administered 2017-12-03 – 2017-12-06 (×4): 20 mg via ORAL
  Filled 2017-12-03 (×4): qty 1

## 2017-12-03 MED ORDER — ALPRAZOLAM 0.5 MG PO TABS
0.5000 mg | ORAL_TABLET | Freq: Two times a day (BID) | ORAL | Status: DC
  Administered 2017-12-03 – 2017-12-07 (×9): 0.5 mg via ORAL
  Filled 2017-12-03 (×9): qty 1

## 2017-12-03 NOTE — Progress Notes (Signed)
PT Cancellation Note  Patient Details Name: Brendan Edwards MRN: [id number] DOB: [birth year]   Cancelled Treatment:    Reason Eval/Treat Not Completed: Medical issues which prohibited therapy.  Feeling tired and SOB and will ck later after pt is finished with morning routine including breakfast.   Ramond Dial 12/03/2017, 9:39 AM   Mee Hives, PT MS Acute Rehab Dept. Number: [place] and [place]

## 2017-12-03 NOTE — Progress Notes (Signed)
PT Cancellation Note  Patient Details Name: Brendan Edwards MRN: [id number] DOB: 27-Oct-1945   Cancelled Treatment:    Reason Eval/Treat Not Completed: Other (comment).  Made mult attempts to see pt and he was unable to be seen over frequent use of bedpan.  Will reattempt at another time.   Ramond Dial 12/03/2017, 4:01 PM   Mee Hives, PT MS Acute Rehab Dept. Number: [place] and [place]

## 2017-12-03 NOTE — Progress Notes (Addendum)
Brendan Edwards at [place] NAME: Brendan Edwards    MR#:  [id number]  DATE OF BIRTH:  [birth year] CHIEF COMPLAINT:  No chief complaint on file.  Patient is very hard of hearing.  Admitted for A. fib with RVR, sepsis, patient had a fall at home and now complains of upper back pain.  Patient heart rate is controlled, sepsis improved but still has upper back pain.  O2 sat dropped, now on 3 L and saturation is 93%.  According to nurse patient O2 sat dropped to 86% on 2 L. REVIEW OF SYSTEMS:    Review of Systems  Constitutional: Negative for chills and fever.  HENT: Positive for hearing loss.   Eyes: Negative for blurred vision, double vision and photophobia.  Respiratory: Positive for cough. Negative for hemoptysis and shortness of breath.   Cardiovascular: Negative for chest pain, palpitations, orthopnea and leg swelling.  Gastrointestinal: Positive for constipation. Negative for abdominal pain, diarrhea and vomiting.  Genitourinary: Negative for dysuria and urgency.  Musculoskeletal: Positive for back pain. Negative for myalgias and neck pain.  Skin: Negative for rash.  Neurological: Negative for dizziness, focal weakness, seizures, weakness and headaches.  Psychiatric/Behavioral: Negative for memory loss. The patient does not have insomnia.     Nutrition: Tolerating Diet: Tolerating PT:      DRUG ALLERGIES:   Allergies  Allergen Reactions  . Oxycodone Other (See Comments)    Hallucinations  . Cardura [Doxazosin Mesylate]   . Darvon [Propoxyphene]   . Penicillins     Has patient had a PCN reaction causing immediate rash, facial/tongue/throat swelling, SOB or lightheadedness with hypotension: Yes Has patient had a PCN reaction causing severe rash involving mucus membranes or skin necrosis: No Has patient had a PCN reaction that required hospitalization: No Has patient had a PCN reaction occurring within the last 10 years: No If all of  the above answers are "NO", then may proceed with Cephalosporin use.  Marland Kitchen Requip [Ropinirole Hcl]   . Septra [Sulfamethoxazole-Trimethoprim]     VITALS:  Blood pressure 123/77, pulse (!) 105, temperature 98.8 F (37.1 C), temperature source Oral, resp. rate 20, height 5\' 9"  (1.753 m), weight 105.2 kg, SpO2 93 %.  PHYSICAL EXAMINATION:   Physical Exam  GENERAL:  72 y.o.-year-old patient lying in the bed with no acute distress.  Mildly obese. EYES: Pupils equal, round, reactive to light and accommodation. No scleral icterus. Extraocular muscles intact.  HEENT: Head atraumatic, normocephalic. Oropharynx and nasopharynx clear.  NECK:  Supple, no jugular venous distention. No thyroid enlargement, no tenderness.  LUNGS: Diminished air entry bilaterally, no wheezing.  CARDIOVASCULAR: S1, S2 irregularly irregular.. No murmurs, rubs, or gallops.  ABDOMEN: Soft, nontender, nondistended. Bowel sounds present. No organomegaly or mass.  EXTREMITIES: Chronic venous stasis.,  2+ edema bilaterally. NEUROLOGIC: Cranial nerves II through XII are intact. Muscle strength 5/5 in all extremities. Sensation intact. Gait not checked.  PSYCHIATRIC: The patient is alert and oriented x 3.  SKIN: No obvious rash, lesion, or ulcer.    LABORATORY PANEL:   CBC Recent Labs  Lab 11/30/17 0443  WBC 10.0  HGB 16.1  HCT 47.3  PLT 169   ------------------------------------------------------------------------------------------------------------------  Chemistries  Recent Labs  Lab 11/28/17 2331  12/03/17 0411  NA 140   < > 135  K 3.4*   < > 4.0  CL 96*   < > 94*  CO2 26   < > 30  GLUCOSE 104*   < >  114*  BUN 10   < > 17  CREATININE 0.88   < > 0.79  CALCIUM 8.4*   < > 8.8*  MG 1.5*  --   --   AST 38  --   --   ALT 25  --   --   ALKPHOS 163*  --   --   BILITOT 1.7*  --   --    < > = values in this interval not displayed.    ------------------------------------------------------------------------------------------------------------------  Cardiac Enzymes Recent Labs  Lab 11/28/17 2331  TROPONINI <0.03   ------------------------------------------------------------------------------------------------------------------  RADIOLOGY:  Mr Thoracic Spine Wo Contrast  Result Date: 12/01/2017 CLINICAL DATA:  Upper back pain after fall 2 days ago. EXAM: MRI THORACIC SPINE WITHOUT CONTRAST TECHNIQUE: Multiplanar, multisequence MR imaging of the thoracic spine was performed. No intravenous contrast was administered. COMPARISON:  CT chest dated November 29, 2017. MRI thoracic spine dated October 09, 2016. FINDINGS: Alignment: Slightly exaggerated kyphosis in the upper thoracic spine. Vertebrae: There is an acute on chronic superior endplate fracture of L2 with associated marrow edema. There is no progressive height loss. Chronic T2, T3, and T12 compression fractures are again noted. 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 Unchanged bone islands in the T8 and T11 vertebral bodies. No evidence of discitis. Cord:  Normal signal and morphology. Paraspinal and other soft tissues: Moderate left and small right pleural effusions with adjacent lower lobe atelectasis, worse on the left. Disc levels: Scattered tiny central disc protrusions. No spinal canal or neuroforaminal stenosis. IMPRESSION: 1. Acute on chronic superior endplate fracture of L2 without progressive height loss. 2. Chronic T2, T3, and T12 compression fractures. 3. Moderate left and small right pleural effusions. Electronically Signed   By: Titus Dubin M.D.   On: 12/01/2017 15:34     ASSESSMENT AND PLAN:   Active Problems:   Acute hypoxemic respiratory failure (HCC)  Sepsis, acute respiratory failure with hypoxia secondary to left pleural effusion, pneumonia: Clinically improving with IV antibiotics,  on 3 L of oxygen  and saturation 94%.  Continue Rocephin, doxycycline today.  Repeat chest x-ray again for follow-up of early effusion, pneumonia, WBC normalized to 10.  It was 13.7. 2.  A. fib with RVR, initially required amiodarone drip, now on amiodarone 200 mg every 12 hours, Cardizem, digoxin, still tachycardic heart rate is in upper 110s.  blood pressure also is stable.  Followed by Dr. Shary Key had echocardiogram results are pending, continue Eliquis for full dose anticoagulation.,   Echocardiogram showed EF 20 to 25%.,,  Concerned about this, patient had normal EF of 50-50% in April of this year.,  Will request cardiology to follow-up on this.    3. patient has chronic venous stasis, AV as an outpatient, continue dressing changes as per wound care recommendation  4.  Upper back pain with chronic T2, T3, T12 compression fractures, MRI of the thoracolumbar spine showed chronic due to, T3, T12 compression fractures which are unchanged, patient has acute on chronic endplate fracture of L2.  MRI of thoracic spine mentioned that no progressive height loss.  Orthopedic recommended medical management, continue pain medicines, Lidoderm patch added yesterday,, add small dose prednisone today and see if it helps.  Patient is willing to sit in the chair with physical therapy today.  #5 . chronic constipation, continue stool softeners today.  #6, anxiety, patient requesting Xanax or Ativan.  Continue Xanax.    All the records are reviewed  and case discussed with Care Management/Social Workerr. Management plans discussed with the patient, family and they are in agreement.  CODE STATUS' full code  TOTAL TIME TAKING CARE OF THIS PATIENT: 40  minutes.  Than 50% of time spent in counseling, coordination of care  POSSIBLE D/C IN 1-2DAYS, DEPENDING ON CLINICAL CONDITION.   Epifanio Lesches M.D on 12/03/2017 at 2:12 PM  Between 7am to 6pm - Pager - [phone]  After 6pm go to [email or web address] - password [email or web address]  [facility]  Office  [phone]  CC: Primary care physician; Tracie Harrier, MD

## 2017-12-03 NOTE — Progress Notes (Signed)
Patient's O2 % was lowering, so O2 was raised to 3L.  Patient has been using his cpap / bipap machine over his nasal canula.  Patient reported anxiety several times.  MD ordered medicine.   Phillis Knack, RN

## 2017-12-03 NOTE — Care Management Important Message (Signed)
Important Message  Patient Details  Name: Brendan Edwards MRN: [id number] Date of Birth: Jun 04, 1945   Medicare Important Message Given:  Yes    [name] 12/03/2017, 11:29 AM

## 2017-12-04 LAB — LEGIONELLA PNEUMOPHILA SEROGP 1 UR AG: L. PNEUMOPHILA SEROGP 1 UR AG: NEGATIVE

## 2017-12-04 LAB — CBC
HEMATOCRIT: 44.4 % (ref 40.0–52.0)
Hemoglobin: 15.5 g/dL (ref 13.0–18.0)
MCH: 37.1 pg — ABNORMAL HIGH (ref 26.0–34.0)
MCHC: 35 g/dL (ref 32.0–36.0)
MCV: 106.1 fL — ABNORMAL HIGH (ref 80.0–100.0)
PLATELETS: 195 10*3/uL (ref 150–440)
RBC: 4.19 MIL/uL — ABNORMAL LOW (ref 4.40–5.90)
RDW: 15.7 % — ABNORMAL HIGH (ref 11.5–14.5)
WBC: 9.1 10*3/uL (ref 3.8–10.6)

## 2017-12-04 LAB — BASIC METABOLIC PANEL
ANION GAP: 10 (ref 5–15)
BUN: 18 mg/dL (ref 8–23)
CO2: 32 mmol/L (ref 22–32)
CREATININE: 0.73 mg/dL (ref 0.61–1.24)
Calcium: 8.8 mg/dL — ABNORMAL LOW (ref 8.9–10.3)
Chloride: 93 mmol/L — ABNORMAL LOW (ref 98–111)
GFR calc Af Amer: >60 mL/min (ref >60)
Glucose, Bld: 143 mg/dL — ABNORMAL HIGH (ref 70–99)
Potassium: 4.3 mmol/L (ref 3.5–5.1)
SODIUM: 135 mmol/L (ref 135–145)

## 2017-12-04 LAB — GLUCOSE, CAPILLARY
GLUCOSE-CAPILLARY: 133 mg/dL — AB (ref 70–99)
GLUCOSE-CAPILLARY: 148 mg/dL — AB (ref 70–99)
Glucose-Capillary: 195 mg/dL — ABNORMAL HIGH (ref 70–99)
Glucose-Capillary: 150 mg/dL — ABNORMAL HIGH (ref 70–99)

## 2017-12-04 LAB — CULTURE, BLOOD (ROUTINE X 2): Culture: NO GROWTH

## 2017-12-04 NOTE — Progress Notes (Signed)
[place] at [place] NAME: Brendan Edwards    MR#:  [id number]  DATE OF BIRTH:  [birth year] Patient seen and evaluated by me today Comfortable on oxygen via nasal cannula at 3 L Does not use oxygen at home in the daytime On CPAP at bedtime No complaints of any chest pain REVIEW OF SYSTEMS:    Review of Systems  Constitutional: Negative for chills and fever.  HENT: Positive for hearing loss.   Eyes: Negative for blurred vision, double vision and photophobia.  Respiratory: Positive for cough. Negative for hemoptysis and shortness of breath.   Cardiovascular: Negative for chest pain, palpitations, orthopnea and leg swelling.  Gastrointestinal: Positive for constipation. Negative for abdominal pain, diarrhea and vomiting.  Genitourinary: Negative for dysuria and urgency.  Musculoskeletal: Positive for back pain. Negative for myalgias and neck pain.  Skin: Negative for rash.  Neurological: Negative for dizziness, focal weakness, seizures, weakness and headaches.  Psychiatric/Behavioral: Negative for memory loss. The patient does not have insomnia.     Nutrition: Tolerating Diet: Tolerating PT:      DRUG ALLERGIES:   Allergies  Allergen Reactions  . Oxycodone Other (See Comments)    Hallucinations  . Cardura [Doxazosin Mesylate]   . Darvon [Propoxyphene]   . Penicillins     Has patient had a PCN reaction causing immediate rash, facial/tongue/throat swelling, SOB or lightheadedness with hypotension: Yes Has patient had a PCN reaction causing severe rash involving mucus membranes or skin necrosis: No Has patient had a PCN reaction that required hospitalization: No Has patient had a PCN reaction occurring within the last 10 years: No If all of the above answers are "NO", then may proceed with Cephalosporin use.  Marland Kitchen Requip [Ropinirole Hcl]   . Septra [Sulfamethoxazole-Trimethoprim]     VITALS:  Blood pressure 114/76, pulse (!)  129, temperature (!) 97.5 F (36.4 C), temperature source Oral, resp. rate 18, height 5\' 9"  (1.753 m), weight 105.2 kg, SpO2 93 %.  PHYSICAL EXAMINATION:   Physical Exam  GENERAL:  72 y.o.-year-old patient lying in the bed with no acute distress.  Mildly obese. EYES: Pupils equal, round, reactive to light and accommodation. No scleral icterus. Extraocular muscles intact.  HEENT: Head atraumatic, normocephalic. Oropharynx and nasopharynx clear.  NECK:  Supple, no jugular venous distention. No thyroid enlargement, no tenderness.  LUNGS: Diminished air entry bilaterally, no wheezing.  CARDIOVASCULAR: S1, S2 irregularly irregular.. No murmurs, rubs, or gallops.  ABDOMEN: Soft, nontender, nondistended. Bowel sounds present. No organomegaly or mass.  EXTREMITIES: Chronic venous stasis.,  2+ edema bilaterally. NEUROLOGIC: Cranial nerves II through XII are intact. Muscle strength 5/5 in all extremities. Sensation intact. Gait not checked.  PSYCHIATRIC: The patient is alert and oriented x 3.  SKIN: No obvious rash, lesion, or ulcer.    LABORATORY PANEL:   CBC Recent Labs  Lab 12/04/17 0359  WBC 9.1  HGB 15.5  HCT 44.4  PLT 195   ------------------------------------------------------------------------------------------------------------------  Chemistries  Recent Labs  Lab 11/28/17 2331  12/04/17 0359  NA 140   < > 135  K 3.4*   < > 4.3  CL 96*   < > 93*  CO2 26   < > 32  GLUCOSE 104*   < > 143*  BUN 10   < > 18  CREATININE 0.88   < > 0.73  CALCIUM 8.4*   < > 8.8*  MG 1.5*  --   --   AST 38  --   --  ALT 25  --   --   ALKPHOS 163*  --   --   BILITOT 1.7*  --   --    < > = values in this interval not displayed.   ------------------------------------------------------------------------------------------------------------------  Cardiac Enzymes Recent Labs  Lab 11/28/17 2331  TROPONINI <0.03    ------------------------------------------------------------------------------------------------------------------  RADIOLOGY:  Dg Chest Port 1 View  Result Date: 12/03/2017 CLINICAL DATA:  Pneumonia, atrial fibrillation, hypertension, smoker EXAM: PORTABLE CHEST 1 VIEW COMPARISON:  Portable exam 1615 hours compared to 11/28/2017 FINDINGS: Enlargement of cardiac silhouette with pulmonary vascular congestion. Atherosclerotic calcification aorta. Patchy infiltrates RIGHT lung new since previous exam consistent with pneumonia. Additional atelectasis versus consolidation LEFT lower lobe again seen. Decreased LEFT pleural effusion since previous exam. No pneumothorax. Bones demineralized. IMPRESSION: New patchy LEFT RIGHT lung infiltrates consistent with pneumonia. Persistent atelectasis versus consolidation LEFT lower lobe with decreased LEFT pleural effusion. Electronically Signed   By: Lavonia Dana M.D.   On: 12/03/2017 16:25     ASSESSMENT AND PLAN:    1. Sepsis secondary to left lung pneumonia with effusion: Clinically improving with IV antibiotics,  on 3 L of oxygen and saturation 94%.  Continue Rocephin, doxycycline today.  Repeat chest x-ray reviewed shows pneumonia Continue antibiotics and diuresis with lasix IV WBC count is improved Blood culture no growth  2.  A. fib with RVR, initially required amiodarone drip, now on amiodarone 200 mg every 12 hours, Cardizem, digoxin, still tachycardic heart rate is in upper 110s on exertion and ambulation.  blood pressure also is stable.  Followed by Dr. Clayborn Bigness, continue Eliquis for  anticoagulation.,   Echocardiogram showed EF 20 to 25% with reduced systolic function Cardiology follow-up    3. Chronic venous stasis, AV as an outpatient, continue dressing changes as per wound care recommendation  4.  Upper back pain with chronic T2, T3, T12 compression fractures, MRI of the thoracolumbar spine showed chronic due to, T3, T12 compression fractures  which are unchanged, patient has acute on chronic endplate fracture of L2.  MRI of thoracic spine mentioned that no progressive height loss.  Orthopedic recommended medical management, continue pain medicines, Lidoderm patch added yesterday,, add small dose prednisone today and see if it helps.  Patient received physical therapy today  5 . chronic constipation, continue stool softeners today.  6. Anxiety :  Continue Xanax.    All the records are reviewed and case discussed with Care Management/Social Workerr. Management plans discussed with the patient, family and they are in agreement.  CODE STATUS : Full code  TOTAL TIME TAKING CARE OF THIS PATIENT: 35  minutes.  Than 50% of time spent in counseling, coordination of care  POSSIBLE D/C IN 1-2DAYS, DEPENDING ON CLINICAL CONDITION.   Saundra Shelling M.D on 12/04/2017 at 11:58 AM  Between 7am to 6pm - Pager - [phone]  After 6pm go to [email or web address] - password [email or web address] [facility]  Office  [phone]  CC: Primary care physician; Tracie Harrier, MD

## 2017-12-04 NOTE — Progress Notes (Signed)
Pt Blood sugar was at 150. Glucometer not sinking yet. Will continue to monitor.

## 2017-12-04 NOTE — Evaluation (Signed)
Physical Therapy Evaluation Patient Details Name: Brendan Edwards MRN: [id number] DOB: Dec 12, 1945 Today's Date: 12/04/2017   History of Present Illness  Patient is a 72 year old male admitted for acute hypoxemic respiratory failure following c/o SOB.  PMH includes DM II, CHF, ETOH, syncope, fall hx and back pain.  Clinical Impression  Patient is a 72 year old male who lives in a one story home alone.  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  Pt in bed with O2 cannula in place upon PT arrival.  He required min A for bed mobility and was hesitant with responses to PT due to being SOB.  Pt is also HOH.  Pt able to sit at EOB without assistance from PT.  Pt attempted to stand from elevated bedside with RW 2x without success.  PT provided VC's for body mechanics and proper use of RW.  PT noted pt becoming tachycardic as well as O2 desat to high 80's so transfer was terminated.  Pt required assistance to return to bed and was able to assist in scooting up in bed with education concerning body mechanics.  Pt will continue to benefit from skilled PT with focus on functional mobility, safe use of AD, tolerance to activity and fall prevention.    Follow Up Recommendations SNF    Equipment Recommendations  None recommended by PT(TBD at next venue of care.)    Recommendations for Other Services       Precautions / Restrictions Precautions Precautions: Fall Restrictions Weight Bearing Restrictions: No      Mobility  Bed Mobility Overal bed mobility: Needs Assistance Bed Mobility: Supine to Sit;Sit to Supine     Supine to sit: Min assist Sit to supine: Min assist   General bed mobility comments: Requires some hand held assist to get to EOB and to return to bed.  VC's and physical assist provided to scoot up in bed. Pt able to bridge and use hand rails.  Transfers Overall transfer level: Needs assistance Equipment used: Rolling walker (2 wheeled) Transfers: Sit to/from  Stand Sit to Stand: Mod assist;From elevated surface         General transfer comment: Two attempts to stand from elevated bed with RW.  VC's provided for body mechanics to provide most energy efficient transfer.  Pt's HR elevated to 129 BPM and transfer was terminated.  Ambulation/Gait Ambulation/Gait assistance: (Deferred due to O2 and HR being outside normal limits.)              Stairs            Wheelchair Mobility    Modified Rankin (Stroke Patients Only)       Balance Overall balance assessment: Needs assistance Sitting-balance support: Bilateral upper extremity supported;Feet supported Sitting balance-Leahy Scale: Good     Standing balance support: Bilateral upper extremity supported Standing balance-Leahy Scale: Fair                               Pertinent Vitals/Pain Pain Assessment: Faces Faces Pain Scale: Hurts a little bit Pain Location: Reports mild back pain. 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     Hand Dominance        Extremity/Trunk Assessment   Upper Extremity Assessment Upper Extremity Assessment: Overall WFL for tasks assessed    Lower Extremity Assessment Lower Extremity Assessment: Overall WFL for tasks assessed(Grossly 4/5 bilaterally with no sensation loss noted.  Swelling of bilateral feet and ankles noted.  Two dressings applied on anterior L LL.)    Cervical / Trunk Assessment Cervical / Trunk Assessment: Kyphotic  Communication   Communication: HOH(Patient hesitates before answering questions, often needing to catch his breath.)  Cognition Arousal/Alertness: Awake/alert Behavior During Therapy:  WFL for tasks assessed/performed Overall Cognitive Status: Within Functional Limits for tasks assessed                                 General Comments: A&O to self and situation.  Follows commands consistently with some hesitation.      General Comments      Exercises     Assessment/Plan    PT Assessment Patient needs continued PT services  PT Problem List Decreased strength;Decreased mobility;Decreased knowledge of use of DME;Decreased balance;Decreased activity tolerance;Cardiopulmonary status limiting activity       PT Treatment Interventions DME instruction;Therapeutic activities;Gait training;Therapeutic exercise;Stair training;Balance training;Functional mobility training;Neuromuscular re-education;Patient/family education    PT Goals (Current goals can be found in the Care Plan section)  Acute Rehab PT Goals Patient Stated Goal: 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 PT Goal Formulation: With patient Time For Goal Achievement: 12/25/17 Potential to Achieve Goals: Good    Frequency Min 2X/week   Barriers to discharge        Co-evaluation               AM-PAC PT "6 Clicks" Daily Activity  Outcome Measure Difficulty turning over in bed (including adjusting bedclothes, sheets and blankets)?: A Lot Difficulty moving from lying on back to sitting on the side of the bed? : A Lot Difficulty sitting down on and standing up from a chair with arms (e.g., wheelchair, bedside commode, etc,.)?: A Lot Help needed moving to and from a bed to chair (including a wheelchair)?: A Lot Help needed walking in hospital room?: Total Help needed climbing 3-5 steps with a railing? : Total 6 Click Score: 10    End of Session Equipment Utilized During Treatment: Gait belt;Oxygen Activity Tolerance: Patient limited by fatigue;Treatment limited secondary to medical complications (Comment)(Cardiopulmonary status.) Patient left: in bed;with  bed alarm set;with call bell/phone within reach Nurse Communication: Mobility status PT Visit Diagnosis: Unsteadiness on feet (R26.81);Muscle weakness (generalized) (M62.81)    Time: 6063-0160 PT Time Calculation (min) (ACUTE ONLY): 22 min   Charges:   PT Evaluation $PT Eval Moderate Complexity: 1 Mod PT Treatments $Therapeutic Activity: 8-22 mins       Roxanne Gates, PT, DPT   Roxanne Gates 12/04/2017, 11:01 AM

## 2017-12-05 ENCOUNTER — Inpatient Hospital Stay: Payer: PPO

## 2017-12-05 LAB — GLUCOSE, CAPILLARY
GLUCOSE-CAPILLARY: 96 mg/dL (ref 70–99)
GLUCOSE-CAPILLARY: 183 mg/dL — AB (ref 70–99)
Glucose-Capillary: 217 mg/dL — ABNORMAL HIGH (ref 70–99)
Glucose-Capillary: 154 mg/dL — ABNORMAL HIGH (ref 70–99)

## 2017-12-05 NOTE — NC FL2 (Signed)
[place] [facility] LEVEL OF CARE SCREENING TOOL     IDENTIFICATION  Patient Name: Brendan Edwards Birthdate: Feb 25, 1946 Sex: male Admission Date (Current Location): 11/28/2017  [facility] [place] Number:      Facility and Address:  [facility], [address], [place], [place] [address]      Provider Number:    Attending Physician Name and Address:  Saundra Shelling, MD  Relative Name and Phone Number:  Gwendolyn Nishi (son) [phone]    Current Level of Care: Hospital Recommended Level of Care: [place] Prior Approval Number:    Date Approved/Denied:   PASRR Number: [id number]  Discharge Plan: SNF    Current Diagnoses: Patient Active Problem List   Diagnosis Date Noted  . Acute hypoxemic respiratory failure ([place]) 11/28/2017  . HTN (hypertension) 05/10/2017  . HLD (hyperlipidemia) 05/10/2017  . GERD (gastroesophageal reflux disease) 05/10/2017  . Atrial fibrillation with RVR ([place]) 08/10/2016  . Diabetes ([place]) 08/10/2016  . Leukocytosis 08/10/2016  . Fall 08/10/2016  . Alcoholic hepatitis 01/27/5101  . Alcohol withdrawal ([place]) 08/10/2016  . Tobacco abuse counseling 08/10/2016    Orientation RESPIRATION BLADDER Height & Weight     Self, Time, Situation, Place  O2(3L acute to be weaned/CPAP at night) Continent Weight: 211 lb (95.7 kg) Height:  5\' 9"  (175.3 cm)  BEHAVIORAL SYMPTOMS/MOOD NEUROLOGICAL BOWEL NUTRITION STATUS      Continent Diet(Heart healthy/Carb modified)  AMBULATORY STATUS COMMUNICATION OF NEEDS Skin   Extensive Assist Verbally Normal                       Personal Care Assistance Level of Assistance  Bathing, Feeding, Dressing Bathing Assistance: Limited assistance Feeding assistance: Independent Dressing Assistance: Limited assistance     Functional Limitations Info  Sight, Hearing, Speech Sight Info: Adequate Hearing Info: Adequate Speech Info: Adequate    SPECIAL CARE FACTORS  FREQUENCY  PT (By licensed PT)     PT Frequency: Up to 5x per week              Contractures Contractures Info: Not present    Additional Factors Info  Code Status, Allergies Code Status Info: Full Allergies Info: Oxycodone, Cardura Doxazosin Mesylate, Darvon Propoxyphene, Penicillins, Requip Ropinirole Hcl, Septra Sulfamethoxazole-trimethoprim           Current Medications (12/05/2017):  This is the current hospital active medication list Current Facility-Administered Medications  Medication Dose Route Frequency Provider Last Rate Last Dose  . acetaminophen (TYLENOL) tablet 650 mg  650 mg Oral Q6H PRN Mayo, Pete Pelt, MD      . allopurinol (ZYLOPRIM) tablet 300 mg  300 mg Oral Daily Arta Silence, MD   300 mg at 12/05/17 1129  . ALPRAZolam Duanne Moron) tablet 0.5 mg  0.5 mg Oral Q12H Epifanio Lesches, MD   0.5 mg at 12/05/17 1132  . amiodarone (PACERONE) tablet 200 mg  200 mg Oral BID Epifanio Lesches, MD   200 mg at 12/05/17 1131  . apixaban (ELIQUIS) tablet 5 mg  5 mg Oral BID Arta Silence, MD   5 mg at 12/05/17 1128  . bisacodyl (DULCOLAX) suppository 10 mg  10 mg Rectal Daily PRN Lance Coon, MD   10 mg at 12/03/17 1731  . cefTRIAXone (ROCEPHIN) 1 g in sodium chloride 0.9 % 100 mL IVPB  1 g Intravenous Q24H Napoleon Form, RPH 200 mL/hr at 12/04/17 2324 1 g at 12/04/17 2324  . digoxin (LANOXIN) tablet 125 mcg  125 mcg Oral Daily Arta Silence, MD   125 mcg at 12/05/17 1129  . diltiazem (CARDIZEM CD) 24 hr capsule 360 mg  360 mg Oral Daily Arta Silence, MD   360 mg at 12/05/17 1128  . docusate sodium (COLACE) capsule 100 mg  100 mg Oral BID Epifanio Lesches, MD   100 mg at 12/05/17 1130  . doxycycline (VIBRA-TABS) tablet 100 mg  100 mg Oral Q12H Napoleon Form, RPH   100 mg at 12/05/17 1131  . folic acid (FOLVITE) tablet 1 mg  1 mg Oral Daily Arta Silence, MD   1 mg at 12/05/17 1132  . furosemide (LASIX) injection 40 mg  40 mg  Intravenous BID Epifanio Lesches, MD   40 mg at 12/05/17 0808  . HYDROcodone-acetaminophen (NORCO/VICODIN) 5-325 MG per tablet 1-2 tablet  1-2 tablet Oral Q4H PRN Mayo, Pete Pelt, MD   2 tablet at 12/05/17 0805  . HYDROmorphone (DILAUDID) injection 0.5 mg  0.5 mg Intravenous Q3H PRN Arta Silence, MD   0.5 mg at 12/02/17 0925  . insulin aspart (novoLOG) injection 0-5 Units  0-5 Units Subcutaneous QHS Sridharan, Prasanna, MD      . insulin aspart (novoLOG) injection 0-9 Units  0-9 Units Subcutaneous TID WC Arta Silence, MD   3 Units at 12/05/17 1212  . ipratropium-albuterol (DUONEB) 0.5-2.5 (3) MG/3ML nebulizer solution 3 mL  3 mL Nebulization Q4H PRN Arta Silence, MD   3 mL at 12/02/17 1534  . lidocaine (LIDODERM) 5 % 1 patch  1 patch Transdermal Q24H Epifanio Lesches, MD   1 patch at 12/05/17 1407  . lisinopril (PRINIVIL,ZESTRIL) tablet 5 mg  5 mg Oral Daily Arta Silence, MD   5 mg at 12/05/17 1130  . multivitamin with minerals tablet 1 tablet  1 tablet Oral Q1200 Napoleon Form, RPH   1 tablet at 12/05/17 1129  . ondansetron (ZOFRAN) tablet 4 mg  4 mg Oral Q6H PRN Arta Silence, MD       Or  . ondansetron (ZOFRAN) injection 4 mg  4 mg Intravenous Q6H PRN Arta Silence, MD      . polyethylene glycol (MIRALAX / GLYCOLAX) packet 17 g  17 g Oral Daily Epifanio Lesches, MD   Stopped at 12/05/17 1118  . predniSONE (DELTASONE) tablet 20 mg  20 mg Oral Q breakfast Epifanio Lesches, MD   20 mg at 12/05/17 0804  . senna-docusate (Senokot-S) tablet 1 tablet  1 tablet Oral QHS PRN Arta Silence, MD      . sodium chloride flush (NS) 0.9 % injection 3 mL  3 mL Intravenous Q12H Arta Silence, MD   3 mL at 12/05/17 0800  . thiamine (VITAMIN B-1) tablet 100 mg  100 mg Oral Daily Arta Silence, MD   100 mg at 12/05/17 1131   Or  . thiamine (B-1) injection 100 mg  100 mg Intravenous Daily Arta Silence, MD   100 mg at 11/29/17 1015  .  traZODone (DESYREL) tablet 50 mg  50 mg Oral QHS Amelia Jo, MD   50 mg at 12/04/17 2241     Discharge Medications: Please see discharge summary for a list of discharge medications.  Relevant Imaging Results:  Relevant Lab Results:   Additional Information SK#[id number]  Zettie Pho, LCSW

## 2017-12-05 NOTE — Consult Note (Signed)
[place] Nurse wound consult note Reason for Consult: Left LE Wound type: Unconfirmed venous insufficiency Reconsulted for care of LLE.  Please see the notes from my associate M. Austin dated 8/19 and 8/20.  There are no ABIs recorded for this patient, which makes the placement of compression wraps unadvisable. Current topical POC using silicone foam dressings is appropriate for anterior LE partial thickness wounds while patient is admitted for cardiac abnormalities and with simultaneous elevation of the extremities.   When possible, please obtain ABIs so that recommendations for compression therapy can be made.  [place] nursing team will not follow, but will remain available to this patient, the nursing and medical teams.  Please re-consult if needed. Thanks, Maudie Flakes, MSN, RN, [place], Arther Abbott  Pager# [phone]

## 2017-12-05 NOTE — Plan of Care (Signed)
  Problem: Health Behavior/Discharge Planning: Goal: Ability to manage health-related needs will improve Outcome: Progressing   Problem: Clinical Measurements: Goal: Diagnostic test results will improve Outcome: Progressing   Problem: Safety: Goal: Ability to remain free from injury will improve Outcome: Progressing   Problem: Education: Goal: Understanding of medication regimen will improve Outcome: Progressing

## 2017-12-05 NOTE — Progress Notes (Signed)
Advanced care plan. Purpose of the Encounter: CODE STATUS Parties in Attendance: Patient Patient's Decision Capacity: Good Subjective/Patient's story: Presented to the emergency room for fall, back pain, shortness of breath and low oxygen saturation Objective/Medical story Has pneumonia, hypoxia and lung pleural effusion Also has vertebral compression fracture causing low back pain Needs IV antibiotics Needs Lasix for diuresis Goals of care determination:  Advance care directives goals of care discussed with the patient Patient wants everything done which includes CPR, intubation if the need arises CODE STATUS: Full code Time spent discussing advanced care planning: 16 minutes

## 2017-12-05 NOTE — Progress Notes (Signed)
Placed patient on cpap with 4 liters o2 without incident. Tolerated well.

## 2017-12-05 NOTE — Progress Notes (Signed)
[facility] [place] NAME: Brendan Edwards    MR#:  [id number]  DATE OF BIRTH:  06-06-1945 Patient seen and evaluated by me today  on oxygen via nasal cannula at 4 L Does not use oxygen at home in the daytime On CPAP at bedtime No complaints of any chest pain occasional cough REVIEW OF SYSTEMS:    Review of Systems  Constitutional: Negative for chills and fever.  HENT: Positive for hearing loss.   Eyes: Negative for blurred vision, double vision and photophobia.  Respiratory: Positive for cough. Negative for hemoptysis and shortness of breath.   Cardiovascular: Negative for chest pain, palpitations, orthopnea and leg swelling.  Gastrointestinal: Positive for constipation. Negative for abdominal pain, diarrhea and vomiting.  Genitourinary: Negative for dysuria and urgency.  Musculoskeletal: Positive for back pain. Negative for myalgias and neck pain.  Skin: Negative for rash.  Neurological: Negative for dizziness, focal weakness, seizures, weakness and headaches.  Psychiatric/Behavioral: Negative for memory loss. The patient does not have insomnia.     Nutrition: Tolerating Diet: Tolerating PT:      DRUG ALLERGIES:   Allergies  Allergen Reactions  . Oxycodone Other (See Comments)    Hallucinations  . Cardura [Doxazosin Mesylate]   . Darvon [Propoxyphene]   . Penicillins     Has patient had a PCN reaction causing immediate rash, facial/tongue/throat swelling, SOB or lightheadedness with hypotension: Yes Has patient had a PCN reaction causing severe rash involving mucus membranes or skin necrosis: No Has patient had a PCN reaction that required hospitalization: No Has patient had a PCN reaction occurring within the last 10 years: No If all of the above answers are "NO", then may proceed with Cephalosporin use.  [place] Kitchen Requip [Ropinirole Hcl]   . Septra [Sulfamethoxazole-Trimethoprim]     VITALS:  Blood pressure (!) 143/91,  pulse (!) 121, temperature 97.8 F (36.6 C), temperature source Oral, resp. rate 16, height 5\' 9"  (1.753 m), weight 95.7 kg, SpO2 96 %.  PHYSICAL EXAMINATION:   Physical Exam  GENERAL:  72 y.o.-year-old patient lying in the bed with no acute distress.  Mildly obese. EYES: Pupils equal, round, reactive to light and accommodation. No scleral icterus. Extraocular muscles intact.  HEENT: Head atraumatic, normocephalic. Oropharynx and nasopharynx clear.  NECK:  Supple, no jugular venous distention. No thyroid enlargement, no tenderness.  LUNGS:Improved air entry bilaterally, no wheezing.  CARDIOVASCULAR: S1, S2 irregularly irregular.. No murmurs, rubs, or gallops.  ABDOMEN: Soft, nontender, nondistended. Bowel sounds present. No organomegaly or mass.  EXTREMITIES: Chronic venous stasis.,  2+ edema bilaterally. NEUROLOGIC: Cranial nerves II through XII are intact. Muscle strength 5/5 in all extremities. Sensation intact. Gait not checked.  PSYCHIATRIC: The patient is alert and oriented x 3.  SKIN: No obvious rash, lesion, or ulcer.    LABORATORY PANEL:   CBC Recent Labs  Lab 12/04/17 0359  WBC 9.1  HGB 15.5  HCT 44.4  PLT 195   ------------------------------------------------------------------------------------------------------------------  Chemistries  Recent Labs  Lab 11/28/17 2331  12/04/17 0359  NA 140   < > 135  K 3.4*   < > 4.3  CL 96*   < > 93*  CO2 26   < > 32  GLUCOSE 104*   < > 143*  BUN 10   < > 18  CREATININE 0.88   < > 0.73  CALCIUM 8.4*   < > 8.8*  MG 1.5*  --   --   AST 38  --   --  ALT 25  --   --   ALKPHOS 163*  --   --   BILITOT 1.7*  --   --    < > = values in this interval not displayed.   ------------------------------------------------------------------------------------------------------------------  Cardiac Enzymes Recent Labs  Lab 11/28/17 2331  TROPONINI <0.03    ------------------------------------------------------------------------------------------------------------------  RADIOLOGY:  Dg Chest Port 1 View  Result Date: 12/03/2017 CLINICAL DATA:  Pneumonia, atrial fibrillation, hypertension, smoker EXAM: PORTABLE CHEST 1 VIEW COMPARISON:  Portable exam 1615 hours compared to 11/28/2017 FINDINGS: Enlargement of cardiac silhouette with pulmonary vascular congestion. Atherosclerotic calcification aorta. Patchy infiltrates RIGHT lung new since previous exam consistent with pneumonia. Additional atelectasis versus consolidation LEFT lower lobe again seen. Decreased LEFT pleural effusion since previous exam. No pneumothorax. Bones demineralized. IMPRESSION: New patchy LEFT RIGHT lung infiltrates consistent with pneumonia. Persistent atelectasis versus consolidation LEFT lower lobe with decreased LEFT pleural effusion. Electronically Signed   By: Lavonia Dana M.D.   On: 12/03/2017 16:25     ASSESSMENT AND PLAN:    1. Sepsis secondary to left lung pneumonia with effusion: Clinically improved with IV antibiotics,  on 3 L of oxygen and saturation 94%.  Continue Rocephin, doxycycline today.  Repeat chest x-ray today Wean oxygen if possible Continue antibiotics and diuresis with lasix IV WBC count is improved Blood culture no growth  2.  A. fib with RVR, initially required amiodarone drip, now on amiodarone 200 mg every 12 hours, Cardizem, digoxin, still tachycardic heart rate is in upper 110s on exertion and ambulation.  blood pressure also is stable.  Followed by Dr. Clayborn Bigness, continue Eliquis for  anticoagulation.,   Echocardiogram showed EF 20 to 25% with reduced systolic function Cardiology follow-up    3. Chronic venous stasis, AV as an outpatient, continue dressing changes as per wound care recommendation  4.  Upper back pain with chronic T2, T3, T12 compression fractures, MRI of the thoracolumbar spine showed chronic due to, T3, T12 compression  fractures which are unchanged, patient has acute on chronic endplate fracture of L2.  MRI of thoracic spine mentioned that no progressive height loss.  Orthopedic recommended medical management, continue pain medicines, Lidoderm patch on board,, add small dose prednisone today and see if it helps.  Patient received physical therapy today  5 . chronic constipation, continue stool softeners today.  6. Anxiety :  Continue Xanax.   7. Discuss with family regarding disposition Social worker f/u   All the records are reviewed and case discussed with Care Management/Social Workerr. Management plans discussed with the patient, family and they are in agreement.  CODE STATUS : Full code  TOTAL TIME TAKING CARE OF THIS PATIENT: 33  minutes.  Than 50% of time spent in counseling, coordination of care  POSSIBLE D/C IN 1-2DAYS, DEPENDING ON CLINICAL CONDITION.   Saundra Shelling M.D on 12/05/2017 at 10:51 AM  Between 7am to 6pm - Pager - [phone]  After 6pm go to [email or web address] - password [email or web address] [facility]  Office  [phone]  CC: Primary care physician; Tracie Harrier, MD

## 2017-12-06 LAB — BASIC METABOLIC PANEL
Anion gap: 8 (ref 5–15)
BUN: 20 mg/dL (ref 8–23)
CALCIUM: 8.6 mg/dL — AB (ref 8.9–10.3)
CO2: 37 mmol/L — AB (ref 22–32)
CREATININE: 0.74 mg/dL (ref 0.61–1.24)
Chloride: 93 mmol/L — ABNORMAL LOW (ref 98–111)
GFR calc Af Amer: >60 mL/min (ref >60)
GLUCOSE: 212 mg/dL — AB (ref 70–99)
Potassium: 3.9 mmol/L (ref 3.5–5.1)
Sodium: 138 mmol/L (ref 135–145)

## 2017-12-06 LAB — GLUCOSE, CAPILLARY
GLUCOSE-CAPILLARY: 86 mg/dL (ref 70–99)
GLUCOSE-CAPILLARY: 117 mg/dL — AB (ref 70–99)
Glucose-Capillary: 205 mg/dL — ABNORMAL HIGH (ref 70–99)
Glucose-Capillary: 169 mg/dL — ABNORMAL HIGH (ref 70–99)

## 2017-12-06 MED ORDER — POTASSIUM CHLORIDE CRYS ER 20 MEQ PO TBCR
40.0000 meq | EXTENDED_RELEASE_TABLET | Freq: Once | ORAL | Status: AC
  Administered 2017-12-06: 40 meq via ORAL
  Filled 2017-12-06: qty 2

## 2017-12-06 MED ORDER — PREDNISONE 10 MG PO TABS
10.0000 mg | ORAL_TABLET | Freq: Every day | ORAL | Status: DC
  Administered 2017-12-07: 10 mg via ORAL
  Filled 2017-12-06: qty 1

## 2017-12-06 MED ORDER — FUROSEMIDE 40 MG PO TABS
40.0000 mg | ORAL_TABLET | Freq: Two times a day (BID) | ORAL | Status: DC
  Administered 2017-12-06 – 2017-12-07 (×2): 40 mg via ORAL
  Filled 2017-12-06 (×2): qty 1

## 2017-12-06 MED ORDER — LOPERAMIDE HCL 2 MG PO CAPS
2.0000 mg | ORAL_CAPSULE | Freq: Four times a day (QID) | ORAL | Status: DC | PRN
  Administered 2017-12-06 – 2017-12-07 (×3): 2 mg via ORAL
  Filled 2017-12-06 (×3): qty 1

## 2017-12-06 NOTE — Clinical Social Work Placement (Signed)
   CLINICAL SOCIAL WORK PLACEMENT  NOTE  Date:  12/06/2017  Patient Details  Name: SHANTANU STRAUCH MRN: [id number] Date of Birth: 1945-09-18  Clinical Social Work is seeking post-discharge placement for this patient at the [place] level of care (*CSW will initial, date and re-position this form in  chart as items are completed):  Yes   Patient/family provided with [facility] Work Department's list of facilities offering this level of care within the geographic area requested by the patient (or if unable, by the patient's family).  Yes   Patient/family informed of their freedom to choose among providers that offer the needed level of care, that participate in Medicare, Medicaid or managed care program needed by the patient, have an available bed and are willing to accept the patient.  Yes   Patient/family informed of [facility]'s ownership interest in [facility] and [facility], as well as of the fact that they are under no obligation to receive care at these facilities.  PASRR submitted to EDS on       PASRR number received on       Existing PASRR number confirmed on 12/06/17     FL2 transmitted to all facilities in geographic area requested by pt/family on 12/05/17     FL2 transmitted to all facilities within larger geographic area on       Patient informed that his/her managed care company has contracts with or will negotiate with certain facilities, including the following:        Yes   Patient/family informed of bed offers received.  Patient chooses bed at ([facility] )     Physician recommends and patient chooses bed at      Patient to be transferred to   on  .  Patient to be transferred to facility by       Patient family notified on   of transfer.  Name of family member notified:        PHYSICIAN       Additional Comment:    _______________________________________________ Clotile Whittington, [place] Beets, LCSW 12/06/2017, 2:21 PM

## 2017-12-06 NOTE — Care Management Important Message (Signed)
Important Message  Patient Details  Name: Brendan Edwards MRN: [id number] Date of Birth: [birth year]   Medicare Important Message Given:  Yes    [name] 12/06/2017, 11:20 AM

## 2017-12-06 NOTE — Progress Notes (Signed)
[place] at [employer] NAME: Brendan Edwards    MR#:  [id number]  DATE OF BIRTH:  01-21-1946 Patient seen and evaluated by me today  on oxygen via nasal cannula at 4 L Does not use oxygen at home in the daytime On CPAP at bedtime No complaints of any chest pain No chest pain Has ambulatory dysfunction REVIEW OF SYSTEMS:   Review of Systems  Constitutional: Negative.   HENT: Negative.   Eyes: Negative.   Respiratory: Decreased cough and shortness of breath Cardiovascular: Negative.   Gastrointestinal: Negative.   Genitourinary: Negative.   Musculoskeletal: Has edema Skin: Negative.   Neurological: Negative.   Endo/Heme/Allergies: Negative.   Psychiatric/Behavioral: Negative.   All other systems reviewed and are negative.        DRUG ALLERGIES:   Allergies  Allergen Reactions  . Oxycodone Other (See Comments)    Hallucinations  . Cardura [Doxazosin Mesylate]   . Darvon [Propoxyphene]   . Penicillins     Has patient had a PCN reaction causing immediate rash, facial/tongue/throat swelling, SOB or lightheadedness with hypotension: Yes Has patient had a PCN reaction causing severe rash involving mucus membranes or skin necrosis: No Has patient had a PCN reaction that required hospitalization: No Has patient had a PCN reaction occurring within the last 10 years: No If all of the above answers are "NO", then may proceed with Cephalosporin use.  Marland Kitchen Requip [Ropinirole Hcl]   . Septra [Sulfamethoxazole-Trimethoprim]     VITALS:  Blood pressure 123/67, pulse 87, temperature 98.4 F (36.9 C), temperature source Oral, resp. rate 17, height 5\' 9"  (1.753 m), weight 101.6 kg, SpO2 97 %.  PHYSICAL EXAMINATION:   Physical Exam  GENERAL:  72 y.o.-year-old patient lying in the bed with no acute distress.  Mildly obese. EYES: Pupils equal, round, reactive to light and accommodation. No scleral icterus. Extraocular muscles intact.   HEENT: Head atraumatic, normocephalic. Oropharynx and nasopharynx clear.  NECK:  Supple, no jugular venous distention. No thyroid enlargement, no tenderness.  LUNGS:Improved air entry bilaterally, no wheezing.  CARDIOVASCULAR: S1, S2 irregularly irregular.. No murmurs, rubs, or gallops.  ABDOMEN: Soft, nontender, nondistended. Bowel sounds present. No organomegaly or mass.  EXTREMITIES: Chronic venous stasis.,  1+ edema bilaterally. Bandages to left lower extremity wounds NEUROLOGIC: Cranial nerves II through XII are intact. Muscle strength 5/5 in all extremities. Sensation intact. Gait not checked.  PSYCHIATRIC: The patient is alert and oriented x 3.  SKIN: No obvious rash, lesion, or ulcer.    LABORATORY PANEL:   CBC Recent Labs  Lab 12/04/17 0359  WBC 9.1  HGB 15.5  HCT 44.4  PLT 195   ------------------------------------------------------------------------------------------------------------------  Chemistries  Recent Labs  Lab 12/06/17 1150  NA 138  K 3.9  CL 93*  CO2 37*  GLUCOSE 212*  BUN 20  CREATININE 0.74  CALCIUM 8.6*   ------------------------------------------------------------------------------------------------------------------  Cardiac Enzymes No results for input(s): TROPONINI in the last 168 hours. ------------------------------------------------------------------------------------------------------------------  RADIOLOGY:  Dg Chest Port 1 View  Result Date: 12/05/2017 CLINICAL DATA:  Pneumonia, cough, shortness of breath EXAM: PORTABLE CHEST 1 VIEW COMPARISON:  12/03/2017 FINDINGS: Multifocal patchy opacities in the right upper lobe, right infrahilar region, and lingula/left lower lobe, suspicious for multifocal pneumonia, grossly unchanged. Asymmetric interstitial edema is considered less likely. No definite pleural effusions, although the left lung base is obscured. No pneumothorax. The heart is top-normal in size. IMPRESSION: Suspected multifocal  pneumonia, grossly unchanged. Asymmetric interstitial edema is  considered less likely. Electronically Signed   By: Julian Hy M.D.   On: 12/05/2017 12:17     ASSESSMENT AND PLAN:    1. Sepsis secondary to left lung pneumonia with effusion improved with IV antibiotics,  on 3 L of oxygen and saturation 94%.   completed course of antibiotics Discontinue IV antibiotics Blood culture no growth  2.  A. fib with RVR, initially required amiodarone drip, now on amiodarone 200 mg every 12 hours,  continue Eliquis for  anticoagulation.,   Echocardiogram showed EF 20 to 25% with reduced systolic function    3. Chronic venous stasis, AV as an outpatient, continue dressing changes as per wound care recommendation  4.  Upper back pain with chronic T2, T3, T12 compression fractures, MRI of the thoracolumbar spine showed chronic due to, T3, T12 compression fractures which are unchanged, patient has acute on chronic endplate fracture of L2.  MRI of thoracic spine mentioned that no progressive height loss.  Orthopedic recommended medical management, continue pain medicines, Lidoderm patch on board,, add small dose prednisone  Patient received physical therapy  Recommended snf  5 . chronic constipation, continue stool softeners today.  6. Anxiety :  Continue Xanax.   7. Social worker f/u for placement Patient unable to take care of himself at home   All the records are reviewed and case discussed with Care Management/Social Workerr. Management plans discussed with the patient, family and they are in agreement.  CODE STATUS : Full code  TOTAL TIME TAKING CARE OF THIS PATIENT: 33  minutes.  Than 50% of time spent in counseling, coordination of care  POSSIBLE D/C IN 1-2DAYS, DEPENDING ON CLINICAL CONDITION.   Saundra Shelling M.D on 12/06/2017 at 1:26 PM  Between 7am to 6pm - Pager - [phone]  After 6pm go to [email or web address] - password [email or web address] [facility]  Office   [phone]  CC: Primary care physician; Tracie Harrier, MD

## 2017-12-06 NOTE — Plan of Care (Signed)
  Problem: Education: Goal: Knowledge of General Education information will improve Description Including pain rating scale, medication(s)/side effects and non-pharmacologic comfort measures Outcome: Progressing   Problem: Health Behavior/Discharge Planning: Goal: Ability to manage health-related needs will improve Outcome: Progressing   Problem: Clinical Measurements: Goal: Ability to maintain clinical measurements within normal limits will improve Outcome: Progressing Goal: Will remain free from infection Outcome: Progressing Goal: Diagnostic test results will improve Outcome: Progressing Goal: Respiratory complications will improve Outcome: Progressing Goal: Cardiovascular complication will be avoided Outcome: Progressing   Problem: Activity: Goal: Risk for activity intolerance will decrease Outcome: Progressing   Problem: Nutrition: Goal: Adequate nutrition will be maintained Outcome: Progressing   Problem: Coping: Goal: Level of anxiety will decrease Outcome: Progressing   Problem: Elimination: Goal: Will not experience complications related to bowel motility Outcome: Progressing Goal: Will not experience complications related to urinary retention Outcome: Progressing   Problem: Pain Managment: Goal: General experience of comfort will improve Outcome: Progressing   Problem: Safety: Goal: Ability to remain free from injury will improve Outcome: Progressing   Problem: Skin Integrity: Goal: Risk for impaired skin integrity will decrease Outcome: Progressing   Problem: Education: Goal: Knowledge of disease or condition will improve Outcome: Progressing Goal: Understanding of medication regimen will improve Outcome: Progressing   Problem: Cardiac: Goal: Ability to achieve and maintain adequate cardiopulmonary perfusion will improve Outcome: Progressing

## 2017-12-06 NOTE — Progress Notes (Signed)
[facility] Team SNF authorization has been received, authorization # [id number] approved for 7 days. Patient and his friend Velva Harman are aware of above. Tammy admissions coordinator at [facility] is aware of above.   [employer], LCSW [phone]

## 2017-12-06 NOTE — Clinical Social Work Note (Addendum)
Clinical Social Work Assessment  Patient Details  Name: Brendan Edwards MRN: [id number] Date of Birth: 01/25/1946  Date of referral:  12/05/17               Reason for consult:  Facility Placement                Permission sought to share information with:  Chartered certified accountant granted to share information::  Yes, Verbal Permission Granted  Name::      [place]::   [place]   Relationship::     Contact Information:     Housing/Transportation Living arrangements for the past 2 months:  [place] of Information:  Patient Patient Interpreter Needed:  None Criminal Activity/Legal Involvement Pertinent to Current Situation/Hospitalization:  No - Comment as needed Significant Relationships:  Adult Children Lives with:  Self Do you feel safe going back to the place where you live?  Yes Need for family participation in patient care:  Yes (Comment)  Care giving concerns:  Patient lives in [place] alone.    Social Worker assessment / plan:  Holiday representative (CSW) reviewed chart and noted that PT is recommending SNF. CSW met with patient alone at bedside to discuss D/C plan. Patient was alert and oriented X3 and was laying in the bed. CSW introduced self and explained role of CSW department. Per patient he lives alone in [place] and has 5 adult children. Per patient he has no HPOA. Patient reported that some of his children live local and visit often. Per patient some of his children live out of town. CSW explained SNF process and that Health Team will have to approve it. CSW also explained that patient will have a $10-$20 co-pay per day at [facility]. Patient verbalized his understanding and stated that he is fine with the co-pay. Per patient he has been to [facility] in the past and prefers to go there again. FL2 complete and faxed out. Per patient he has his own cpap at home that he can get his girlfriend to bring to SNF.   CSW  presented bed offers to patient and he chose [facility]. CSW stated [facility] SNF authorization today. Tammy admissions coordinator at [facility] is aware of above. CSW will continue to follow and assist as needed.   Employment status:  Disabled (Comment on whether or not currently receiving Disability), Retired Nurse, adult PT Recommendations:  [place] / Referral to community resources:  [place]  Patient/Family's Response to care:  Patient accepted bed offer from [facility].   Patient/Family's Understanding of and Emotional Response to Diagnosis, Current Treatment, and Prognosis:  Patient was very pleasant and thanked CSW for assistance.   Emotional Assessment Appearance:  Appears stated age Attitude/Demeanor/Rapport:    Affect (typically observed):  Accepting, Adaptable, Pleasant Orientation:  Oriented to Self, Oriented to Place, Oriented to  Time, Oriented to Situation Alcohol / Substance use:  Not Applicable Psych involvement (Current and /or in the community):  No (Comment)  Discharge Needs  Concerns to be addressed:  Discharge Planning Concerns Readmission within the last 30 days:  No Current discharge risk:  Dependent with Mobility Barriers to Discharge:  Continued Medical Work up   [employer], Veronia Beets, LCSW 12/06/2017, 2:22 PM

## 2017-12-06 NOTE — Plan of Care (Signed)
  Problem: Education: Goal: Knowledge of General Education information will improve Description Including pain rating scale, medication(s)/side effects and non-pharmacologic comfort measures Outcome: Progressing   Problem: Clinical Measurements: Goal: Will remain free from infection Outcome: Progressing   Problem: Safety: Goal: Ability to remain free from injury will improve Outcome: Progressing   Problem: Education: Goal: Understanding of medication regimen will improve Outcome: Progressing

## 2017-12-06 NOTE — Progress Notes (Signed)
Oxygen weaned down to 2 liters,uses CPAP at night,large BM today,vital signs within normal limits.

## 2017-12-07 DIAGNOSIS — I4891 Unspecified atrial fibrillation: Secondary | ICD-10-CM | POA: Diagnosis not present

## 2017-12-07 DIAGNOSIS — J9690 Respiratory failure, unspecified, unspecified whether with hypoxia or hypercapnia: Secondary | ICD-10-CM | POA: Diagnosis not present

## 2017-12-07 DIAGNOSIS — I10 Essential (primary) hypertension: Secondary | ICD-10-CM | POA: Diagnosis not present

## 2017-12-07 DIAGNOSIS — M109 Gout, unspecified: Secondary | ICD-10-CM | POA: Diagnosis not present

## 2017-12-07 DIAGNOSIS — J159 Unspecified bacterial pneumonia: Secondary | ICD-10-CM | POA: Diagnosis not present

## 2017-12-07 DIAGNOSIS — M5489 Other dorsalgia: Secondary | ICD-10-CM | POA: Diagnosis not present

## 2017-12-07 DIAGNOSIS — D529 Folate deficiency anemia, unspecified: Secondary | ICD-10-CM | POA: Diagnosis not present

## 2017-12-07 DIAGNOSIS — J9601 Acute respiratory failure with hypoxia: Secondary | ICD-10-CM | POA: Diagnosis not present

## 2017-12-07 DIAGNOSIS — Z9911 Dependence on respirator [ventilator] status: Secondary | ICD-10-CM | POA: Diagnosis not present

## 2017-12-07 DIAGNOSIS — J90 Pleural effusion, not elsewhere classified: Secondary | ICD-10-CM | POA: Diagnosis not present

## 2017-12-07 DIAGNOSIS — E785 Hyperlipidemia, unspecified: Secondary | ICD-10-CM | POA: Diagnosis not present

## 2017-12-07 DIAGNOSIS — J189 Pneumonia, unspecified organism: Secondary | ICD-10-CM | POA: Diagnosis not present

## 2017-12-07 DIAGNOSIS — M1000 Idiopathic gout, unspecified site: Secondary | ICD-10-CM | POA: Diagnosis not present

## 2017-12-07 DIAGNOSIS — K219 Gastro-esophageal reflux disease without esophagitis: Secondary | ICD-10-CM | POA: Diagnosis not present

## 2017-12-07 DIAGNOSIS — G4730 Sleep apnea, unspecified: Secondary | ICD-10-CM | POA: Diagnosis not present

## 2017-12-07 LAB — GLUCOSE, CAPILLARY
Glucose-Capillary: 76 mg/dL (ref 70–99)
Glucose-Capillary: 141 mg/dL — ABNORMAL HIGH (ref 70–99)

## 2017-12-07 MED ORDER — HYDROCODONE-ACETAMINOPHEN 5-325 MG PO TABS: 1.0000 | ORAL_TABLET | Freq: Four times a day (QID) | ORAL | 0 refills | Status: AC | PRN

## 2017-12-07 MED ORDER — PREDNISONE 5 MG PO TABS: 5.0000 mg | ORAL_TABLET | Freq: Every day | ORAL | 0 refills | Status: AC

## 2017-12-07 MED ORDER — PREMIER PROTEIN SHAKE
11.0000 [oz_av] | Freq: Two times a day (BID) | ORAL | Status: DC
  Administered 2017-12-07 (×2): 11 [oz_av] via ORAL

## 2017-12-07 MED ORDER — OCUVITE-LUTEIN PO CAPS
1.0000 | ORAL_CAPSULE | Freq: Every day | ORAL | Status: DC
  Administered 2017-12-07: 1 via ORAL
  Filled 2017-12-07: qty 1

## 2017-12-07 MED ORDER — POTASSIUM CHLORIDE CRYS ER 20 MEQ PO TBCR: 20.0000 meq | EXTENDED_RELEASE_TABLET | Freq: Every day | ORAL | 0 refills | Status: AC

## 2017-12-07 MED ORDER — LOPERAMIDE HCL 2 MG PO CAPS
2.0000 mg | ORAL_CAPSULE | ORAL | Status: DC | PRN
  Administered 2017-12-07: 2 mg via ORAL
  Filled 2017-12-07: qty 1

## 2017-12-07 MED ORDER — TRAZODONE HCL 50 MG PO TABS: 50.0000 mg | ORAL_TABLET | Freq: Every day | ORAL | 0 refills | Status: AC

## 2017-12-07 MED ORDER — TRAMADOL HCL 50 MG PO TABS: 50.0000 mg | ORAL_TABLET | Freq: Every evening | ORAL | 0 refills | Status: AC | PRN

## 2017-12-07 MED ORDER — FUROSEMIDE 40 MG PO TABS
40.0000 mg | ORAL_TABLET | Freq: Every day | ORAL | Status: DC
Start: 2017-12-07 — End: 2017-12-07
  Administered 2017-12-07: 40 mg via ORAL
  Filled 2017-12-07: qty 1

## 2017-12-07 NOTE — Progress Notes (Signed)
Patient is medically stable for D/C to [facility] today. Per Otila Kluver [facility] liaison patient can come today to room 807. RN will call report and arrange EMS for transport. Clinical Education officer, museum (CSW) sent D/C orders to [facility] via [facility]. Patient is aware of above. CSW left patient's friend Velva Harman and his son Manish voicemails. Patient is an advancing you home patient and [place] is aware of above. Please reconsult if future social work needs arise. CSW signing off.   [employer], LCSW [phone]

## 2017-12-07 NOTE — Plan of Care (Signed)
  Problem: Education: Goal: Knowledge of General Education information will improve Description Including pain rating scale, medication(s)/side effects and non-pharmacologic comfort measures Outcome: Progressing   Problem: Health Behavior/Discharge Planning: Goal: Ability to manage health-related needs will improve Outcome: Progressing   Problem: Clinical Measurements: Goal: Ability to maintain clinical measurements within normal limits will improve Outcome: Progressing Goal: Will remain free from infection Outcome: Progressing Goal: Diagnostic test results will improve Outcome: Progressing Goal: Respiratory complications will improve Outcome: Progressing Goal: Cardiovascular complication will be avoided Outcome: Progressing   Problem: Activity: Goal: Risk for activity intolerance will decrease Outcome: Progressing   Problem: Nutrition: Goal: Adequate nutrition will be maintained Outcome: Progressing   Problem: Coping: Goal: Level of anxiety will decrease Outcome: Progressing   Problem: Elimination: Goal: Will not experience complications related to bowel motility Outcome: Progressing Goal: Will not experience complications related to urinary retention Outcome: Progressing   Problem: Pain Managment: Goal: General experience of comfort will improve Outcome: Progressing   Problem: Safety: Goal: Ability to remain free from injury will improve Outcome: Progressing   Problem: Skin Integrity: Goal: Risk for impaired skin integrity will decrease Outcome: Progressing   Problem: Education: Goal: Knowledge of disease or condition will improve Outcome: Progressing Goal: Understanding of medication regimen will improve Outcome: Progressing   Problem: Cardiac: Goal: Ability to achieve and maintain adequate cardiopulmonary perfusion will improve Outcome: Progressing

## 2017-12-07 NOTE — Progress Notes (Signed)
Initial Nutrition Assessment  DOCUMENTATION CODES:   Obesity unspecified  INTERVENTION:   Recommend check Mg and P labs as pt likely at refeeding risk   Premier Protein BID, each supplement provides 160 kcal and 30 grams of protein.   MVI, thiamine and folic acid in setting of etoh abuse  Ocuvite daily for wound healing (provides zinc, vitamin A, vitamin C, Vitamin E, copper, and selenium)  Liberalize diet  NUTRITION DIAGNOSIS:   Increased nutrient needs related to acute illness(PNA with sepsis ) as evidenced by increased estimated needs.  GOAL:   Patient will meet greater than or equal to 90% of their needs  MONITOR:   PO intake, Supplement acceptance, Labs, Weight trends, Skin, I & O's  REASON FOR ASSESSMENT:   LOS    ASSESSMENT:   72 y.o. male with a known history of T2NIDDM, HTN, Afib (Eliquis + Digoxin), chronic systolic CHF (EF 24-40% as of 07/29/2017 Echo), EtOH abuse/dependence who p/w EtOH intoxication, syncope, fall, back pain. Pt found to have Afib with PNA and sepsis    Met with pt in room today. Pt reports good appetite and oral intake at baseline. Pt reports his appetite is good in hospital; pt eating 75-100% of meals. Pt does not drink any supplements at home but is willing to try chocolate Premier Protein while here. Pt reports that he does take a MVI "every once in a while when I remember". Per chart, pt appears wt stable pta but it is difficult to determine pt's true weight as he has moderate to severe edema. RD will add supplements and vitamins to help pt meet his estimated needs and encourage wound healing. RD will liberalize pt's diet as a heart healthy diet restricts protein. Recommend check Mg and P labs as pt likely at refeeding risk secondary to etoh abuse.   Medications reviewed and include: allopurinol, colace, folic acid, lasix, insulin, MVI, miralax, prednisone, thiamine, hydrocodone, immodium  Labs reviewed: MCV 106.1(H), MCH 37.1(H) cbgs- 86,  205, 169, 117, 76 x 24hrs  NUTRITION - FOCUSED PHYSICAL EXAM:    Most Recent Value  Orbital Region  No depletion  Upper Arm Region  No depletion  Thoracic and Lumbar Region  No depletion  Buccal Region  No depletion  Temple Region  No depletion  Clavicle Bone Region  No depletion  Clavicle and Acromion Bone Region  No depletion  Scapular Bone Region  No depletion  Dorsal Hand  No depletion  Patellar Region  No depletion  Anterior Thigh Region  No depletion  Posterior Calf Region  Mild depletion  Edema (RD Assessment)  Moderate [BLE, BUE]     Diet Order:   Diet Order            Diet heart healthy/carb modified Room service appropriate? Yes; Fluid consistency: Thin  Diet effective now             EDUCATION NEEDS:   Education needs have been addressed  Skin:  Skin Assessment: Reviewed RN Assessment(MASD, ecchymosis, venous stasis ulcer L leg)  Last BM:  8/26- type 7  Height:   Ht Readings from Last 1 Encounters:  11/29/17 '5\' 9"'$  (1.753 m)    Weight:   Wt Readings from Last 1 Encounters:  12/07/17 95.3 kg    Ideal Body Weight:  72.7 kg  BMI:  Body mass index is 31.01 kg/m.  Estimated Nutritional Needs:   Kcal:  2000-2300kcal/day   Protein:  95-105g/day   Fluid:  >2L/day   Koleen Distance  MS, RD, LDN Pager #- [phone] Office#- [phone] After Hours Pager: [phone]

## 2017-12-07 NOTE — Clinical Social Work Placement (Signed)
   CLINICAL SOCIAL WORK PLACEMENT  NOTE  Date:  12/07/2017  Patient Details  Name: Brendan Edwards MRN: [id number] Date of Birth: [birth year]  Clinical Social Work is seeking post-discharge placement for this patient at the [place] level of care (*CSW will initial, date and re-position this form in  chart as items are completed):  Yes   Patient/family provided with [place] Work Department's list of facilities offering this level of care within the geographic area requested by the patient (or if unable, by the patient's family).  Yes   Patient/family informed of their freedom to choose among providers that offer the needed level of care, that participate in Medicare, Medicaid or managed care program needed by the patient, have an available bed and are willing to accept the patient.  Yes   Patient/family informed of [place]'s ownership interest in [facility] and [facility], as well as of the fact that they are under no obligation to receive care at these facilities.  PASRR submitted to EDS on       PASRR number received on       Existing PASRR number confirmed on 12/06/17     FL2 transmitted to all facilities in geographic area requested by pt/family on 12/05/17     FL2 transmitted to all facilities within larger geographic area on       Patient informed that his/her managed care company has contracts with or will negotiate with certain facilities, including the following:        Yes   Patient/family informed of bed offers received.  Patient chooses bed at ([facility] )     Physician recommends and patient chooses bed at      Patient to be transferred to ([facility] ) on 12/07/17.  Patient to be transferred to facility by [facility] )     Patient family notified on 12/07/17 of transfer.  Name of family member notified:  ([place] left patient's friend Velva Harman and son anastasios melander. )     PHYSICIAN       Additional Comment:     _______________________________________________ Wilsie Kern, Veronia Beets, LCSW 12/07/2017, 12:24 PM

## 2017-12-07 NOTE — Progress Notes (Signed)
Physical Therapy Treatment Patient Details Name: Brendan Edwards MRN: [id number] DOB: 1946/01/10 Today's Date: 12/07/2017    History of Present Illness Patient is a 72 year old male admitted for acute hypoxemic respiratory failure following c/o SOB.  PMH includes DM II, CHF, ETOH, syncope, fall hx and back pain.    PT Comments    Pt c/o back pain and receiving medication from RN upon arrival.  Rates 9/10.  Pt refusing to try out of bed to recliner for position change but agrees to sit edge of bed.  Min a for bed mobility and remained sitting for 15 minutes before needing to lay back down due to pain.  Sats monitored on 2 lpm and remained in low 90's during session.  He did c/o some dizziness upon sitting but relieved with time.  BP 131/75.  Pt encouraged to try out of bed later today as tolerated and he stated he would try after lunch and a nap.   Follow Up Recommendations  SNF     Equipment Recommendations  None recommended by PT    Recommendations for Other Services       Precautions / Restrictions Precautions Precautions: Fall Restrictions Weight Bearing Restrictions: No    Mobility  Bed Mobility Overal bed mobility: Needs Assistance Bed Mobility: Supine to Sit;Sit to Supine     Supine to sit: Min assist Sit to supine: Min assist      Transfers                 General transfer comment: declined transfer and further mobility  Ambulation/Gait                 Stairs             Wheelchair Mobility    Modified Rankin (Stroke Patients Only)       Balance Overall balance assessment: Needs assistance Sitting-balance support: Bilateral upper extremity supported;Feet supported Sitting balance-Leahy Scale: Good                                      Cognition Arousal/Alertness: Awake/alert Behavior During Therapy: WFL for tasks assessed/performed Overall Cognitive Status: Within Functional Limits for tasks assessed                                         Exercises Other Exercises Other Exercises: sat edge of bed x 15 minutes without LOB.  leaning on L knee at end due to fatiue and back pain.    General Comments        Pertinent Vitals/Pain Pain Assessment: 0-10 Pain Score: 9  Pain Location: back Pain Descriptors / Indicators: Aching;Sore Pain Intervention(s): Premedicated before session;Monitored during session;Limited activity within patient's tolerance    Home Living                      Prior Function            PT Goals (current goals can now be found in the care plan section) Progress towards PT goals: Progressing toward goals    Frequency    Min 2X/week      PT Plan Current plan remains appropriate    Co-evaluation              AM-PAC PT "6 Clicks" Daily Activity  Outcome Measure  Difficulty turning over in bed (including adjusting bedclothes, sheets and blankets)?: A Lot Difficulty moving from lying on back to sitting on the side of the bed? : Unable Difficulty sitting down on and standing up from a chair with arms (e.g., wheelchair, bedside commode, etc,.)?: Unable Help needed moving to and from a bed to chair (including a wheelchair)?: A Lot Help needed walking in hospital room?: Total Help needed climbing 3-5 steps with a railing? : Total 6 Click Score: 8    End of Session Equipment Utilized During Treatment: Oxygen Activity Tolerance: Patient tolerated treatment well;Patient limited by pain Patient left: in bed;with bed alarm set;with call bell/phone within reach Nurse Communication: Other (comment)       Time: 0712-1975 PT Time Calculation (min) (ACUTE ONLY): 20 min  Charges:  $Therapeutic Activity: 8-22 mins                     Chesley Noon, PTA 12/07/17, 10:56 AM

## 2017-12-07 NOTE — Progress Notes (Signed)
Patient is discharge to [facility] in a stable condition, report given to floor nurse , awaiting pick up by EMS .

## 2017-12-07 NOTE — Discharge Summary (Addendum)
[employer] [facility] NAME: Brendan Edwards    MR#:  [id number]  DATE OF BIRTH:  [birth year]  DATE OF ADMISSION:  11/28/2017 ADMITTING PHYSICIAN: Arta Silence, MD  DATE OF DISCHARGE: 12/07/2017  PRIMARY CARE PHYSICIAN: Tracie Harrier, MD   ADMISSION DIAGNOSIS:  Fall  Acute hypoxic respiratory failure Left lung pneumonia with effusion Sepsis Atrial fibrillation with rapid rate Syncope Hypokalemia  DISCHARGE DIAGNOSIS:  Active Problems:   Acute hypoxemic respiratory failure (HCC) Pneumonia Sepsis Atrial fibrillation Chronic venous stasis  SECONDARY DIAGNOSIS:   Past Medical History:  Diagnosis Date  . A-fib ([place])   . Benign prostatic hyperplasia   . Cardiomyopathy, secondary ([place])   . GERD (gastroesophageal reflux disease)   . Gout   . Hyperlipidemia   . Hypertension   . Obesity   . Sleep apnea      ADMITTING HISTORY Brendan Edwards  is a 72 y.o. male with a known history of T2NIDDM, HTN, Afib (Eliquis + Digoxin), chronic systolic CHF (EF 99-37% as of 07/29/2017 Echo), EtOH abuse/dependence who p/w EtOH intoxication, syncope, fall, back pain. Pt is a poor historian, and can tell me very little. He states that he stood up from his recliner to go to the kitchen, and passed out. He states his back hurts (beneath R shoulder blade). He denies shortness of breath or CP. His girlfriend (at bedside) states that he had been complaining of SOB occasionally for the past 1wk or so. She received a phone call from the pt overnight stating that he had fallen and could not get up. She went to his home to find him lying on the floor on his R side, w/ severe back pain worsened w/ movement/position change. He had been down for ~1hr. She believes he may have hit his back on the TV stand when he fell. She called EMS. She states he did not appear to be short of breath at that time. Pt denies cough, fever, chills, diaphoresis, rigors, night sweats,  N/V/D/AP, HA/blurred vision, urinary symptoms. His girlfriend says that he apparently does not take his Lasix as he is supposed to, because he does not like the increased frequency of urination. He claims to take the rest of his medications regularly.   HOSPITAL COURSE:  Patient was admitted to telemetry.  Patient received oxygen via nasal cannula and uses CPAP at bedtime.  He was started on IV antibiotics.  He received IV amiodarone drip for atrial fibrillation with rapid rate increased to oral amiodarone.  Patient's left pleural effusion and pneumonia improved with antibiotics.  Patient was diuresed with IV Lasix initially which was switched to oral Lasix during the hospitalization.  Patient follows up with [facility] clinic cardiology and also was worked up with echocardiogram.  Patient has chronic venous stasis in the lower extremities with wounds and wound care consultation team took care of the patient.  Patient has upper back pain secondary to chronic T2-T3 and T12 compression fractures.Acute on chronic L2 end plate fracture with no height loss.  Patient was seen by orthopedic surgeon Dr. Rudene Christians who did not recommend any acute intervention.  Patient's hypoxia improved shortness of breath improved.  Completed the course of antibiotics.  Received physical therapy during hospitalization.  Heart rate was controlled well with oral amiodarone.Blood cultures did not reveal any growth.  Patient tolerated diet well.  Patient unable to take care of himself at home physical therapy recommended SNF placement.  We will continue anticoagulation with oral Eliquis.  patient has a bed at rehab facility.  Follow-up with Dr. Rudene Christians in the outpatient clinic. Transthoracic echocardiography. Image   quality was fair. The study was technically difficult. - Left ventricle: Systolic function was severely reduced. The   estimated ejection fraction was in the range of 20% to 25%. - Aortic valve: Valve area (VTI): 1.43 cm^2. Valve  area (Vmax):   1.62 cm^2. Valve area (Vmean): 1.43 cm^2. - Left atrium: The atrium was mildly dilated. - Right ventricle: The cavity size was moderately dilated. - Tricuspid valve: There was moderate regurgitation. - Pericardium, extracardiac: There was a left pleural effusion. All in all patient was aggressively diuresed tolerated antibiotics well hypoxia improved.  Ambulatory dysfunction needs physical therapy and rehab placement.  CONSULTS OBTAINED:  Treatment Team:  Arta Silence, MD Hessie Knows, MD  DRUG ALLERGIES:   Allergies  Allergen Reactions  . Oxycodone Other (See Comments)    Hallucinations  . Cardura [Doxazosin Mesylate]   . Darvon [Propoxyphene]   . Penicillins     Has patient had a PCN reaction causing immediate rash, facial/tongue/throat swelling, SOB or lightheadedness with hypotension: Yes Has patient had a PCN reaction causing severe rash involving mucus membranes or skin necrosis: No Has patient had a PCN reaction that required hospitalization: No Has patient had a PCN reaction occurring within the last 10 years: No If all of the above answers are "NO", then may proceed with Cephalosporin use.  Marland Kitchen Requip [Ropinirole Hcl]   . Septra [Sulfamethoxazole-Trimethoprim]     DISCHARGE MEDICATIONS:   Allergies as of 12/07/2017      Reactions   Oxycodone Other (See Comments)   Hallucinations   Cardura [doxazosin Mesylate]    Darvon [propoxyphene]    Penicillins    Has patient had a PCN reaction causing immediate rash, facial/tongue/throat swelling, SOB or lightheadedness with hypotension: Yes Has patient had a PCN reaction causing severe rash involving mucus membranes or skin necrosis: No Has patient had a PCN reaction that required hospitalization: No Has patient had a PCN reaction occurring within the last 10 years: No If all of the above answers are "NO", then may proceed with Cephalosporin use.   Requip [ropinirole Hcl]    Septra  [sulfamethoxazole-trimethoprim]       Medication List    STOP taking these medications   benzocaine 10 % mucosal gel Commonly known as:  ORAJEL   digoxin 0.125 MG tablet Commonly known as:  LANOXIN   oxyCODONE-acetaminophen 5-325 MG tablet Commonly known as:  PERCOCET/ROXICET   thiamine 50 MG tablet     TAKE these medications   acetaminophen 325 MG tablet Commonly known as:  TYLENOL Take 2 tablets (650 mg total) by mouth every 6 (six) hours as needed for mild pain (or Fever >/= 101).   allopurinol 300 MG tablet Commonly known as:  ZYLOPRIM Take 300 mg by mouth daily.   amiodarone 200 MG tablet Commonly known as:  PACERONE Take 1 tablet (200 mg total) by mouth 2 (two) times daily.   apixaban 5 MG Tabs tablet Commonly known as:  ELIQUIS Take 5 mg by mouth 2 (two) times daily.   diltiazem 360 MG 24 hr capsule Commonly known as:  CARDIZEM CD Take 1 capsule (360 mg total) by mouth daily.   folic acid 1 MG tablet Commonly known as:  FOLVITE Take 1 tablet (1 mg total) by mouth daily.   furosemide 40 MG tablet Commonly known as:  LASIX Take 40 mg by mouth daily.   HYDROcodone-acetaminophen  5-325 MG tablet Commonly known as:  NORCO/VICODIN Take 1-2 tablets by mouth every 6 (six) hours as needed for up to 7 days for severe pain.   lisinopril 5 MG tablet Commonly known as:  PRINIVIL,ZESTRIL Take 5 mg by mouth daily.   potassium chloride SA 20 MEQ tablet Commonly known as:  K-DUR,KLOR-CON Take 1 tablet (20 mEq total) by mouth daily. What changed:  when to take this   predniSONE 5 MG tablet Commonly known as:  DELTASONE Take 1 tablet (5 mg total) by mouth daily with breakfast for 5 days. Start taking on:  12/08/2017   traMADol 50 MG tablet Commonly known as:  ULTRAM Take 50 mg by mouth at bedtime as needed.   traZODone 50 MG tablet Commonly known as:  DESYREL Take 1 tablet (50 mg total) by mouth at bedtime for 15 days.     CPAP at bedtime  Today  Patient  seen and evaluated today No chest pain Decreased shortness of breath Comfortable on oxygen by nasal cannula  VITAL SIGNS:  Blood pressure 140/73, pulse 95, temperature (!) 97.5 F (36.4 C), temperature source Oral, resp. rate 18, height 5\' 9"  (1.753 m), weight 95.3 kg, SpO2 93 %.  I/O:    Intake/Output Summary (Last 24 hours) at 12/07/2017 1127 Last data filed at 12/07/2017 0500 Gross per 24 hour  Intake 240 ml  Output 1200 ml  Net -960 ml    PHYSICAL EXAMINATION:  Physical Exam  GENERAL:  72 y.o.-year-old patient lying in the bed with no acute distress.  LUNGS: Normal breath sounds bilaterally, no wheezing, rales,rhonchi or crepitation. No use of accessory muscles of respiration.  CARDIOVASCULAR: S1, S2 normal. No murmurs, rubs, or gallops.  ABDOMEN: Soft, non-tender, non-distended. Bowel sounds present. No organomegaly or mass.  NEUROLOGIC: Moves all 4 extremities. PSYCHIATRIC: The patient is alert and oriented x 3.  SKIN: No obvious rash, lesion, or ulcer.   DATA REVIEW:   CBC Recent Labs  Lab 12/04/17 0359  WBC 9.1  HGB 15.5  HCT 44.4  PLT 195    Chemistries  Recent Labs  Lab 12/06/17 1150  NA 138  K 3.9  CL 93*  CO2 37*  GLUCOSE 212*  BUN 20  CREATININE 0.74  CALCIUM 8.6*    Cardiac Enzymes No results for input(s): TROPONINI in the last 168 hours.  Microbiology Results  Results for orders placed or performed during the hospital encounter of 11/28/17  Blood Culture (routine x 2)     Status: None   Collection Time: 11/28/17 11:31 PM  Result Value Ref Range Status   Specimen Description BLOOD RIGHT ANTECUBITAL  Final   Special Requests   Final    BOTTLES DRAWN AEROBIC AND ANAEROBIC Blood Culture adequate volume   Culture   Final    NO GROWTH 5 DAYS Performed at [facility], [address] [place], [place] [address]    Report Status 12/03/2017 FINAL  Final  Blood Culture (routine x 2)     Status: None   Collection Time: 11/29/17   1:15 AM  Result Value Ref Range Status   Specimen Description BLOOD RIGHT WRIST  Final   Special Requests   Final    BOTTLES DRAWN AEROBIC ONLY Blood Culture results may not be optimal due to an inadequate volume of blood received in culture bottles   Culture   Final    NO GROWTH 5 DAYS Performed at [facility], [address], [place], [place] [address]  Report Status 12/04/2017 FINAL  Final    RADIOLOGY:  No results found.  Follow up with PCP in 1 week.  Management plans discussed with the patient, family and they are in agreement.  CODE STATUS: Full code    Code Status Orders  (From admission, onward)         Start     Ordered   11/29/17 0039  Full code  Continuous     11/29/17 0038        Code Status History    Date Active Date Inactive Code Status Order ID Comments User Context   11/29/2017 0038 11/29/2017 0038 Full Code [id number]  Arta Silence, MD Inpatient   11/29/2017 0038 11/29/2017 0038 Full Code [id number]  Arta Silence, MD Inpatient   07/28/2017 1954 07/29/2017 1818 Full Code [id number]  Nicholes Mango, MD Inpatient   05/11/2017 0119 05/13/2017 1919 Full Code [id number]  Lance Coon, MD Inpatient   10/07/2016 1640 10/12/2016 1909 Full Code [id number]  Vaughan Basta, MD ED   08/10/2016 2017 08/20/2016 1517 Full Code [id number]  Theodoro Grist, MD ED    Advance Directive Documentation     Most Recent Value  Type of Advance Directive  Healthcare Power of [place], Living will  Pre-existing out of facility DNR order (yellow form or pink MOST form)  -  "MOST" Form in Place?  -      TOTAL TIME TAKING CARE OF THIS PATIENT ON DAY OF DISCHARGE: more than 34 minutes.   Saundra Shelling M.D on 12/07/2017 at 11:27 AM  Between 7am to 6pm - Pager - [phone]  After 6pm go to [email or web address] - password [email or web address]  Office  [phone]  CC: Primary care physician; Tracie Harrier, MD  Note: This dictation was  prepared with Dragon dictation along with smaller phrase technology. Any transcriptional errors that result from this process are unintentional.

## 2017-12-12 DIAGNOSIS — M109 Gout, unspecified: Secondary | ICD-10-CM | POA: Diagnosis not present

## 2017-12-12 DIAGNOSIS — J189 Pneumonia, unspecified organism: Secondary | ICD-10-CM | POA: Diagnosis not present

## 2017-12-12 DIAGNOSIS — I10 Essential (primary) hypertension: Secondary | ICD-10-CM | POA: Diagnosis not present

## 2017-12-12 DIAGNOSIS — J9601 Acute respiratory failure with hypoxia: Secondary | ICD-10-CM | POA: Diagnosis not present

## 2017-12-12 DIAGNOSIS — I4891 Unspecified atrial fibrillation: Secondary | ICD-10-CM | POA: Diagnosis not present

## 2017-12-12 DIAGNOSIS — M5489 Other dorsalgia: Secondary | ICD-10-CM | POA: Diagnosis not present

## 2017-12-17 ENCOUNTER — Other Ambulatory Visit: Payer: Self-pay | Admitting: *Deleted

## 2017-12-17 DIAGNOSIS — M109 Gout, unspecified: Secondary | ICD-10-CM | POA: Diagnosis not present

## 2017-12-17 DIAGNOSIS — J189 Pneumonia, unspecified organism: Secondary | ICD-10-CM | POA: Diagnosis not present

## 2017-12-17 DIAGNOSIS — I4891 Unspecified atrial fibrillation: Secondary | ICD-10-CM | POA: Diagnosis not present

## 2017-12-17 DIAGNOSIS — I10 Essential (primary) hypertension: Secondary | ICD-10-CM | POA: Diagnosis not present

## 2017-12-17 DIAGNOSIS — M5489 Other dorsalgia: Secondary | ICD-10-CM | POA: Diagnosis not present

## 2017-12-17 NOTE — Patient Outreach (Signed)
[facility]) Care Management  12/17/2017  Brendan Edwards October 02, 1945 [id number]   Collaboration with [facility] UM Team member Marlowe Kays, who received report from facility.   Attempted to see patient at the bedside at [facility] but patient was not available.  [facility] Packet left on patient's bedside table.   Plan to visit patient next week.   Rutherford Limerick RN, BSN [place] Acute Care Coordinator [phone]) Business Mobile [phone]) Toll free office

## 2017-12-21 ENCOUNTER — Other Ambulatory Visit: Payer: Self-pay | Admitting: *Deleted

## 2017-12-21 NOTE — Patient Outreach (Signed)
[facility]) Care Management  12/21/2017  Brendan Edwards 10/11/1945 [id number]   Met with discharge planning team at [facility]. Patient discharged yesterday. Patient has [facility] packet with contact information. No CM needs identified.   Rutherford Limerick RN, BSN [place] Acute Care Coordinator [phone]) Business Mobile [phone]) Toll free office

## 2018-01-04 DIAGNOSIS — Z09 Encounter for follow-up examination after completed treatment for conditions other than malignant neoplasm: Secondary | ICD-10-CM | POA: Diagnosis not present

## 2018-01-04 DIAGNOSIS — Z720 Tobacco use: Secondary | ICD-10-CM | POA: Diagnosis not present

## 2018-01-04 DIAGNOSIS — I10 Essential (primary) hypertension: Secondary | ICD-10-CM | POA: Diagnosis not present

## 2018-01-04 DIAGNOSIS — G4733 Obstructive sleep apnea (adult) (pediatric): Secondary | ICD-10-CM | POA: Diagnosis not present

## 2018-01-04 DIAGNOSIS — I482 Chronic atrial fibrillation: Secondary | ICD-10-CM | POA: Diagnosis not present

## 2018-01-04 DIAGNOSIS — E119 Type 2 diabetes mellitus without complications: Secondary | ICD-10-CM | POA: Diagnosis not present

## 2018-01-04 DIAGNOSIS — I5022 Chronic systolic (congestive) heart failure: Secondary | ICD-10-CM | POA: Diagnosis not present

## 2018-02-11 ENCOUNTER — Other Ambulatory Visit: Payer: Self-pay

## 2018-03-13 DEATH — deceased
